# Patient Record
Sex: Male | Born: 1945 | Race: White | Hispanic: No | Marital: Married | Smoking: Current every day smoker
Health system: Southern US, Community
[De-identification: ages and names within clinical notes are randomized; demographics above are authoritative.]

## PROBLEM LIST (undated history)

## (undated) DIAGNOSIS — I6529 Occlusion and stenosis of unspecified carotid artery: Secondary | ICD-10-CM

## (undated) DIAGNOSIS — I2510 Atherosclerotic heart disease of native coronary artery without angina pectoris: Secondary | ICD-10-CM

## (undated) DIAGNOSIS — K5790 Diverticulosis of intestine, part unspecified, without perforation or abscess without bleeding: Secondary | ICD-10-CM

## (undated) DIAGNOSIS — I749 Embolism and thrombosis of unspecified artery: Secondary | ICD-10-CM

## (undated) DIAGNOSIS — E119 Type 2 diabetes mellitus without complications: Secondary | ICD-10-CM

## (undated) DIAGNOSIS — K219 Gastro-esophageal reflux disease without esophagitis: Secondary | ICD-10-CM

## (undated) DIAGNOSIS — M545 Low back pain, unspecified: Secondary | ICD-10-CM

## (undated) DIAGNOSIS — K279 Peptic ulcer, site unspecified, unspecified as acute or chronic, without hemorrhage or perforation: Secondary | ICD-10-CM

## (undated) DIAGNOSIS — M1A9XX1 Chronic gout, unspecified, with tophus (tophi): Secondary | ICD-10-CM

## (undated) DIAGNOSIS — G8929 Other chronic pain: Secondary | ICD-10-CM

## (undated) DIAGNOSIS — S62102A Fracture of unspecified carpal bone, left wrist, initial encounter for closed fracture: Secondary | ICD-10-CM

## (undated) DIAGNOSIS — Z8673 Personal history of transient ischemic attack (TIA), and cerebral infarction without residual deficits: Secondary | ICD-10-CM

## (undated) DIAGNOSIS — E785 Hyperlipidemia, unspecified: Secondary | ICD-10-CM

## (undated) DIAGNOSIS — I708 Atherosclerosis of other arteries: Secondary | ICD-10-CM

## (undated) DIAGNOSIS — K4090 Unilateral inguinal hernia, without obstruction or gangrene, not specified as recurrent: Secondary | ICD-10-CM

## (undated) DIAGNOSIS — I10 Essential (primary) hypertension: Secondary | ICD-10-CM

## (undated) DIAGNOSIS — F528 Other sexual dysfunction not due to a substance or known physiological condition: Secondary | ICD-10-CM

## (undated) DIAGNOSIS — Z720 Tobacco use: Secondary | ICD-10-CM

## (undated) DIAGNOSIS — M1990 Unspecified osteoarthritis, unspecified site: Secondary | ICD-10-CM

## (undated) DIAGNOSIS — I639 Cerebral infarction, unspecified: Secondary | ICD-10-CM

## (undated) DIAGNOSIS — R892 Abnormal level of other drugs, medicaments and biological substances in specimens from other organs, systems and tissues: Secondary | ICD-10-CM

## (undated) HISTORY — DX: Personal history of transient ischemic attack (TIA), and cerebral infarction without residual deficits: Z86.73

## (undated) HISTORY — PX: CERVICAL SPINE SURGERY: SHX589

## (undated) HISTORY — DX: Gastro-esophageal reflux disease without esophagitis: K21.9

## (undated) HISTORY — DX: Low back pain, unspecified: M54.50

## (undated) HISTORY — DX: Fracture of unspecified carpal bone, left wrist, initial encounter for closed fracture: S62.102A

## (undated) HISTORY — DX: Other chronic pain: G89.29

## (undated) HISTORY — DX: Abnormal level of other drugs, medicaments and biological substances in specimens from other organs, systems and tissues: R89.2

## (undated) HISTORY — PX: BACK SURGERY: SHX140

## (undated) HISTORY — DX: Type 2 diabetes mellitus without complications: E11.9

## (undated) HISTORY — DX: Chronic gout, unspecified, with tophus (tophi): M1A.9XX1

## (undated) HISTORY — DX: Other sexual dysfunction not due to a substance or known physiological condition: F52.8

## (undated) HISTORY — DX: Tobacco use: Z72.0

## (undated) HISTORY — DX: Essential (primary) hypertension: I10

## (undated) HISTORY — DX: Low back pain: M54.5

## (undated) HISTORY — DX: Hyperlipidemia, unspecified: E78.5

## (undated) HISTORY — DX: Peptic ulcer, site unspecified, unspecified as acute or chronic, without hemorrhage or perforation: K27.9

## (undated) HISTORY — DX: Cerebral infarction, unspecified: I63.9

## (undated) HISTORY — DX: Diverticulosis of intestine, part unspecified, without perforation or abscess without bleeding: K57.90

## (undated) HISTORY — PX: SPINE SURGERY: SHX786

## (undated) HISTORY — DX: Atherosclerotic heart disease of native coronary artery without angina pectoris: I25.10

## (undated) HISTORY — DX: Occlusion and stenosis of unspecified carotid artery: I65.29

## (undated) HISTORY — DX: Atherosclerosis of other arteries: I70.8

## (undated) HISTORY — DX: Unilateral inguinal hernia, without obstruction or gangrene, not specified as recurrent: K40.90

## (undated) HISTORY — DX: Embolism and thrombosis of unspecified artery: I74.9

## (undated) HISTORY — DX: Unspecified osteoarthritis, unspecified site: M19.90

---

## 1980-12-24 HISTORY — PX: NOSE SURGERY: SHX723

## 1982-12-24 HISTORY — PX: NECK SURGERY: SHX720

## 1995-12-25 HISTORY — PX: KNEE SURGERY: SHX244

## 1998-04-11 ENCOUNTER — Emergency Department (HOSPITAL_COMMUNITY): Admission: EM | Admit: 1998-04-11 | Discharge: 1998-04-11 | Payer: Self-pay | Admitting: Emergency Medicine

## 1998-04-13 ENCOUNTER — Encounter: Admission: RE | Admit: 1998-04-13 | Discharge: 1998-04-13 | Payer: Self-pay | Admitting: Family Medicine

## 1998-04-27 ENCOUNTER — Encounter: Admission: RE | Admit: 1998-04-27 | Discharge: 1998-04-27 | Payer: Self-pay | Admitting: Family Medicine

## 1998-05-11 ENCOUNTER — Encounter: Admission: RE | Admit: 1998-05-11 | Discharge: 1998-05-11 | Payer: Self-pay | Admitting: Family Medicine

## 1998-08-09 ENCOUNTER — Encounter: Admission: RE | Admit: 1998-08-09 | Discharge: 1998-08-09 | Payer: Self-pay | Admitting: Sports Medicine

## 1998-08-11 ENCOUNTER — Encounter: Admission: RE | Admit: 1998-08-11 | Discharge: 1998-08-11 | Payer: Self-pay | Admitting: Family Medicine

## 1998-09-21 ENCOUNTER — Encounter: Admission: RE | Admit: 1998-09-21 | Discharge: 1998-09-21 | Payer: Self-pay | Admitting: Family Medicine

## 1998-09-24 ENCOUNTER — Encounter: Payer: Self-pay | Admitting: Family Medicine

## 1998-09-24 ENCOUNTER — Inpatient Hospital Stay (HOSPITAL_COMMUNITY): Admission: EM | Admit: 1998-09-24 | Discharge: 1998-09-25 | Payer: Self-pay | Admitting: Emergency Medicine

## 1998-10-04 ENCOUNTER — Encounter: Admission: RE | Admit: 1998-10-04 | Discharge: 1998-10-04 | Payer: Self-pay | Admitting: *Deleted

## 1998-11-04 ENCOUNTER — Encounter: Admission: RE | Admit: 1998-11-04 | Discharge: 1998-11-04 | Payer: Self-pay | Admitting: Family Medicine

## 1998-11-30 ENCOUNTER — Encounter: Admission: RE | Admit: 1998-11-30 | Discharge: 1998-11-30 | Payer: Self-pay | Admitting: Family Medicine

## 1999-01-05 ENCOUNTER — Encounter: Admission: RE | Admit: 1999-01-05 | Discharge: 1999-01-05 | Payer: Self-pay | Admitting: Family Medicine

## 1999-01-25 ENCOUNTER — Encounter: Admission: RE | Admit: 1999-01-25 | Discharge: 1999-01-25 | Payer: Self-pay | Admitting: Family Medicine

## 1999-03-15 ENCOUNTER — Encounter: Admission: RE | Admit: 1999-03-15 | Discharge: 1999-03-15 | Payer: Self-pay | Admitting: Family Medicine

## 1999-06-09 ENCOUNTER — Encounter: Admission: RE | Admit: 1999-06-09 | Discharge: 1999-06-09 | Payer: Self-pay | Admitting: Family Medicine

## 1999-07-29 ENCOUNTER — Encounter: Payer: Self-pay | Admitting: Neurological Surgery

## 1999-07-29 ENCOUNTER — Ambulatory Visit (HOSPITAL_COMMUNITY): Admission: RE | Admit: 1999-07-29 | Discharge: 1999-07-29 | Payer: Self-pay | Admitting: Neurological Surgery

## 1999-08-15 ENCOUNTER — Encounter: Admission: RE | Admit: 1999-08-15 | Discharge: 1999-08-15 | Payer: Self-pay | Admitting: Family Medicine

## 1999-09-05 ENCOUNTER — Encounter: Admission: RE | Admit: 1999-09-05 | Discharge: 1999-09-05 | Payer: Self-pay | Admitting: Family Medicine

## 1999-09-25 ENCOUNTER — Ambulatory Visit (HOSPITAL_COMMUNITY): Admission: RE | Admit: 1999-09-25 | Discharge: 1999-09-25 | Payer: Self-pay | Admitting: Neurological Surgery

## 1999-09-25 ENCOUNTER — Encounter: Payer: Self-pay | Admitting: Neurological Surgery

## 1999-10-09 ENCOUNTER — Ambulatory Visit (HOSPITAL_COMMUNITY): Admission: RE | Admit: 1999-10-09 | Discharge: 1999-10-09 | Payer: Self-pay | Admitting: Neurological Surgery

## 1999-10-09 ENCOUNTER — Encounter: Payer: Self-pay | Admitting: Neurological Surgery

## 1999-10-23 ENCOUNTER — Ambulatory Visit (HOSPITAL_COMMUNITY): Admission: RE | Admit: 1999-10-23 | Discharge: 1999-10-23 | Payer: Self-pay | Admitting: Neurological Surgery

## 1999-10-23 ENCOUNTER — Encounter: Payer: Self-pay | Admitting: Neurological Surgery

## 1999-11-03 ENCOUNTER — Encounter: Admission: RE | Admit: 1999-11-03 | Discharge: 1999-11-03 | Payer: Self-pay | Admitting: Neurological Surgery

## 1999-11-03 ENCOUNTER — Encounter: Payer: Self-pay | Admitting: Neurological Surgery

## 2000-02-06 ENCOUNTER — Ambulatory Visit (HOSPITAL_COMMUNITY): Admission: RE | Admit: 2000-02-06 | Discharge: 2000-02-06 | Payer: Self-pay | Admitting: Sports Medicine

## 2000-02-06 ENCOUNTER — Encounter: Admission: RE | Admit: 2000-02-06 | Discharge: 2000-02-06 | Payer: Self-pay | Admitting: Family Medicine

## 2000-03-19 ENCOUNTER — Encounter: Admission: RE | Admit: 2000-03-19 | Discharge: 2000-03-19 | Payer: Self-pay | Admitting: Sports Medicine

## 2000-05-10 ENCOUNTER — Encounter: Admission: RE | Admit: 2000-05-10 | Discharge: 2000-05-10 | Payer: Self-pay | Admitting: Family Medicine

## 2000-05-22 ENCOUNTER — Encounter: Admission: RE | Admit: 2000-05-22 | Discharge: 2000-05-22 | Payer: Self-pay | Admitting: Family Medicine

## 2000-08-26 ENCOUNTER — Emergency Department (HOSPITAL_COMMUNITY): Admission: EM | Admit: 2000-08-26 | Discharge: 2000-08-26 | Payer: Self-pay | Admitting: Emergency Medicine

## 2000-10-23 ENCOUNTER — Encounter: Admission: RE | Admit: 2000-10-23 | Discharge: 2000-10-23 | Payer: Self-pay | Admitting: Family Medicine

## 2001-04-28 ENCOUNTER — Encounter: Admission: RE | Admit: 2001-04-28 | Discharge: 2001-04-28 | Payer: Self-pay | Admitting: Family Medicine

## 2001-10-17 ENCOUNTER — Encounter: Admission: RE | Admit: 2001-10-17 | Discharge: 2001-10-17 | Payer: Self-pay | Admitting: Family Medicine

## 2002-01-07 ENCOUNTER — Encounter: Admission: RE | Admit: 2002-01-07 | Discharge: 2002-01-07 | Payer: Self-pay | Admitting: Family Medicine

## 2002-02-06 ENCOUNTER — Encounter: Admission: RE | Admit: 2002-02-06 | Discharge: 2002-02-06 | Payer: Self-pay | Admitting: Family Medicine

## 2002-08-13 ENCOUNTER — Encounter: Admission: RE | Admit: 2002-08-13 | Discharge: 2002-08-13 | Payer: Self-pay | Admitting: Family Medicine

## 2002-12-04 ENCOUNTER — Encounter: Admission: RE | Admit: 2002-12-04 | Discharge: 2002-12-04 | Payer: Self-pay | Admitting: Family Medicine

## 2003-06-23 ENCOUNTER — Encounter: Admission: RE | Admit: 2003-06-23 | Discharge: 2003-06-23 | Payer: Self-pay | Admitting: Family Medicine

## 2003-07-23 ENCOUNTER — Encounter: Admission: RE | Admit: 2003-07-23 | Discharge: 2003-07-23 | Payer: Self-pay | Admitting: Family Medicine

## 2003-09-17 ENCOUNTER — Emergency Department (HOSPITAL_COMMUNITY): Admission: EM | Admit: 2003-09-17 | Discharge: 2003-09-17 | Payer: Self-pay | Admitting: Emergency Medicine

## 2003-09-23 ENCOUNTER — Encounter: Admission: RE | Admit: 2003-09-23 | Discharge: 2003-09-23 | Payer: Self-pay | Admitting: Family Medicine

## 2003-09-28 ENCOUNTER — Encounter: Admission: RE | Admit: 2003-09-28 | Discharge: 2003-09-28 | Payer: Self-pay | Admitting: Family Medicine

## 2003-10-20 ENCOUNTER — Encounter: Admission: RE | Admit: 2003-10-20 | Discharge: 2003-10-20 | Payer: Self-pay | Admitting: Family Medicine

## 2003-11-04 ENCOUNTER — Encounter: Admission: RE | Admit: 2003-11-04 | Discharge: 2003-11-04 | Payer: Self-pay | Admitting: Sports Medicine

## 2003-12-06 ENCOUNTER — Encounter
Admission: RE | Admit: 2003-12-06 | Discharge: 2004-03-05 | Payer: Self-pay | Admitting: Physical Medicine and Rehabilitation

## 2004-03-06 ENCOUNTER — Encounter
Admission: RE | Admit: 2004-03-06 | Discharge: 2004-06-04 | Payer: Self-pay | Admitting: Physical Medicine and Rehabilitation

## 2004-06-02 ENCOUNTER — Encounter: Admission: RE | Admit: 2004-06-02 | Discharge: 2004-06-02 | Payer: Self-pay | Admitting: Sports Medicine

## 2004-06-05 ENCOUNTER — Encounter
Admission: RE | Admit: 2004-06-05 | Discharge: 2004-09-03 | Payer: Self-pay | Admitting: Physical Medicine and Rehabilitation

## 2004-08-23 ENCOUNTER — Encounter: Admission: RE | Admit: 2004-08-23 | Discharge: 2004-08-23 | Payer: Self-pay | Admitting: Family Medicine

## 2004-08-28 ENCOUNTER — Emergency Department (HOSPITAL_COMMUNITY): Admission: EM | Admit: 2004-08-28 | Discharge: 2004-08-29 | Payer: Self-pay | Admitting: Emergency Medicine

## 2004-09-12 ENCOUNTER — Encounter
Admission: RE | Admit: 2004-09-12 | Discharge: 2004-11-09 | Payer: Self-pay | Admitting: Physical Medicine and Rehabilitation

## 2004-09-13 ENCOUNTER — Ambulatory Visit: Payer: Self-pay | Admitting: Physical Medicine and Rehabilitation

## 2004-09-19 ENCOUNTER — Ambulatory Visit (HOSPITAL_COMMUNITY)
Admission: RE | Admit: 2004-09-19 | Discharge: 2004-09-19 | Payer: Self-pay | Admitting: Physical Medicine and Rehabilitation

## 2004-11-09 ENCOUNTER — Encounter
Admission: RE | Admit: 2004-11-09 | Discharge: 2005-01-03 | Payer: Self-pay | Admitting: Physical Medicine and Rehabilitation

## 2004-11-10 ENCOUNTER — Ambulatory Visit: Payer: Self-pay | Admitting: Physical Medicine and Rehabilitation

## 2004-11-21 ENCOUNTER — Ambulatory Visit (HOSPITAL_COMMUNITY): Admission: RE | Admit: 2004-11-21 | Discharge: 2004-11-22 | Payer: Self-pay | Admitting: Neurological Surgery

## 2004-12-12 ENCOUNTER — Emergency Department (HOSPITAL_COMMUNITY): Admission: EM | Admit: 2004-12-12 | Discharge: 2004-12-13 | Payer: Self-pay | Admitting: Emergency Medicine

## 2005-01-03 ENCOUNTER — Encounter
Admission: RE | Admit: 2005-01-03 | Discharge: 2005-04-03 | Payer: Self-pay | Admitting: Physical Medicine and Rehabilitation

## 2005-01-05 ENCOUNTER — Ambulatory Visit: Payer: Self-pay | Admitting: Physical Medicine and Rehabilitation

## 2005-02-10 ENCOUNTER — Emergency Department (HOSPITAL_COMMUNITY): Admission: EM | Admit: 2005-02-10 | Discharge: 2005-02-10 | Payer: Self-pay | Admitting: Family Medicine

## 2005-02-28 ENCOUNTER — Ambulatory Visit: Payer: Self-pay | Admitting: Physical Medicine and Rehabilitation

## 2005-03-20 ENCOUNTER — Ambulatory Visit: Payer: Self-pay | Admitting: Family Medicine

## 2005-05-02 ENCOUNTER — Encounter
Admission: RE | Admit: 2005-05-02 | Discharge: 2005-07-31 | Payer: Self-pay | Admitting: Physical Medicine and Rehabilitation

## 2005-05-04 ENCOUNTER — Ambulatory Visit: Payer: Self-pay | Admitting: Physical Medicine and Rehabilitation

## 2005-06-04 ENCOUNTER — Encounter
Admission: RE | Admit: 2005-06-04 | Discharge: 2005-09-02 | Payer: Self-pay | Admitting: Physical Medicine and Rehabilitation

## 2005-06-27 ENCOUNTER — Ambulatory Visit: Payer: Self-pay | Admitting: Physical Medicine and Rehabilitation

## 2005-07-23 ENCOUNTER — Ambulatory Visit: Payer: Self-pay | Admitting: Family Medicine

## 2005-08-21 ENCOUNTER — Ambulatory Visit: Payer: Self-pay | Admitting: Physical Medicine and Rehabilitation

## 2005-08-21 ENCOUNTER — Encounter
Admission: RE | Admit: 2005-08-21 | Discharge: 2005-11-19 | Payer: Self-pay | Admitting: Physical Medicine and Rehabilitation

## 2005-10-01 ENCOUNTER — Encounter
Admission: RE | Admit: 2005-10-01 | Discharge: 2005-10-15 | Payer: Self-pay | Admitting: Physical Medicine and Rehabilitation

## 2005-10-18 ENCOUNTER — Ambulatory Visit: Payer: Self-pay | Admitting: Physical Medicine and Rehabilitation

## 2005-11-09 ENCOUNTER — Ambulatory Visit: Payer: Self-pay | Admitting: Family Medicine

## 2005-12-08 ENCOUNTER — Encounter
Admission: RE | Admit: 2005-12-08 | Discharge: 2006-03-08 | Payer: Self-pay | Admitting: Physical Medicine and Rehabilitation

## 2005-12-08 ENCOUNTER — Ambulatory Visit: Payer: Self-pay | Admitting: Physical Medicine and Rehabilitation

## 2005-12-24 DIAGNOSIS — I2510 Atherosclerotic heart disease of native coronary artery without angina pectoris: Secondary | ICD-10-CM

## 2005-12-24 HISTORY — PX: ESOPHAGOGASTRODUODENOSCOPY: SHX1529

## 2005-12-24 HISTORY — DX: Atherosclerotic heart disease of native coronary artery without angina pectoris: I25.10

## 2006-01-11 ENCOUNTER — Ambulatory Visit: Payer: Self-pay | Admitting: Family Medicine

## 2006-01-31 ENCOUNTER — Ambulatory Visit: Payer: Self-pay | Admitting: Physical Medicine and Rehabilitation

## 2006-02-18 ENCOUNTER — Ambulatory Visit: Payer: Self-pay | Admitting: Family Medicine

## 2006-03-05 ENCOUNTER — Ambulatory Visit: Payer: Self-pay | Admitting: Family Medicine

## 2006-03-19 ENCOUNTER — Ambulatory Visit: Payer: Self-pay | Admitting: Physical Medicine and Rehabilitation

## 2006-03-19 ENCOUNTER — Encounter
Admission: RE | Admit: 2006-03-19 | Discharge: 2006-06-17 | Payer: Self-pay | Admitting: Physical Medicine and Rehabilitation

## 2006-03-28 ENCOUNTER — Ambulatory Visit: Payer: Self-pay | Admitting: Family Medicine

## 2006-04-09 ENCOUNTER — Ambulatory Visit: Payer: Self-pay | Admitting: Sports Medicine

## 2006-04-22 ENCOUNTER — Ambulatory Visit: Payer: Self-pay | Admitting: Physical Medicine and Rehabilitation

## 2006-04-25 ENCOUNTER — Ambulatory Visit (HOSPITAL_COMMUNITY): Admission: RE | Admit: 2006-04-25 | Discharge: 2006-04-25 | Payer: Self-pay | Admitting: Gastroenterology

## 2006-05-01 ENCOUNTER — Ambulatory Visit: Payer: Self-pay | Admitting: Gastroenterology

## 2006-05-21 ENCOUNTER — Ambulatory Visit: Payer: Self-pay | Admitting: Physical Medicine and Rehabilitation

## 2006-06-18 ENCOUNTER — Encounter
Admission: RE | Admit: 2006-06-18 | Discharge: 2006-09-16 | Payer: Self-pay | Admitting: Physical Medicine and Rehabilitation

## 2006-07-05 ENCOUNTER — Ambulatory Visit: Payer: Self-pay | Admitting: Family Medicine

## 2006-07-19 ENCOUNTER — Ambulatory Visit: Payer: Self-pay | Admitting: Physical Medicine and Rehabilitation

## 2006-09-02 ENCOUNTER — Ambulatory Visit: Payer: Self-pay | Admitting: Family Medicine

## 2006-09-10 ENCOUNTER — Ambulatory Visit: Payer: Self-pay | Admitting: Physical Medicine and Rehabilitation

## 2006-09-15 ENCOUNTER — Emergency Department (HOSPITAL_COMMUNITY): Admission: EM | Admit: 2006-09-15 | Discharge: 2006-09-15 | Payer: Self-pay | Admitting: Family Medicine

## 2006-09-27 ENCOUNTER — Ambulatory Visit: Payer: Self-pay | Admitting: Family Medicine

## 2006-10-08 ENCOUNTER — Encounter
Admission: RE | Admit: 2006-10-08 | Discharge: 2007-01-06 | Payer: Self-pay | Admitting: Physical Medicine and Rehabilitation

## 2006-11-05 ENCOUNTER — Ambulatory Visit: Payer: Self-pay | Admitting: Physical Medicine and Rehabilitation

## 2006-11-07 ENCOUNTER — Ambulatory Visit: Payer: Self-pay | Admitting: Sports Medicine

## 2006-11-26 ENCOUNTER — Ambulatory Visit: Payer: Self-pay | Admitting: Family Medicine

## 2006-12-06 ENCOUNTER — Ambulatory Visit: Payer: Self-pay | Admitting: Sports Medicine

## 2006-12-24 HISTORY — PX: COLONOSCOPY: SHX174

## 2006-12-31 ENCOUNTER — Ambulatory Visit: Payer: Self-pay | Admitting: Physical Medicine and Rehabilitation

## 2007-01-10 ENCOUNTER — Ambulatory Visit: Payer: Self-pay | Admitting: Family Medicine

## 2007-01-23 ENCOUNTER — Ambulatory Visit (HOSPITAL_COMMUNITY): Admission: RE | Admit: 2007-01-23 | Discharge: 2007-01-23 | Payer: Self-pay | Admitting: Gastroenterology

## 2007-01-30 ENCOUNTER — Encounter
Admission: RE | Admit: 2007-01-30 | Discharge: 2007-04-30 | Payer: Self-pay | Admitting: Physical Medicine and Rehabilitation

## 2007-02-12 ENCOUNTER — Ambulatory Visit: Payer: Self-pay | Admitting: Physical Medicine and Rehabilitation

## 2007-02-20 DIAGNOSIS — F528 Other sexual dysfunction not due to a substance or known physiological condition: Secondary | ICD-10-CM | POA: Insufficient documentation

## 2007-02-20 DIAGNOSIS — K219 Gastro-esophageal reflux disease without esophagitis: Secondary | ICD-10-CM

## 2007-02-20 DIAGNOSIS — F17200 Nicotine dependence, unspecified, uncomplicated: Secondary | ICD-10-CM

## 2007-02-20 DIAGNOSIS — E118 Type 2 diabetes mellitus with unspecified complications: Secondary | ICD-10-CM | POA: Insufficient documentation

## 2007-02-20 DIAGNOSIS — I10 Essential (primary) hypertension: Secondary | ICD-10-CM | POA: Insufficient documentation

## 2007-02-20 DIAGNOSIS — E7800 Pure hypercholesterolemia, unspecified: Secondary | ICD-10-CM

## 2007-02-20 DIAGNOSIS — M159 Polyosteoarthritis, unspecified: Secondary | ICD-10-CM | POA: Insufficient documentation

## 2007-02-24 ENCOUNTER — Encounter
Admission: RE | Admit: 2007-02-24 | Discharge: 2007-05-25 | Payer: Self-pay | Admitting: Physical Medicine and Rehabilitation

## 2007-03-12 ENCOUNTER — Encounter (INDEPENDENT_AMBULATORY_CARE_PROVIDER_SITE_OTHER): Payer: Self-pay | Admitting: *Deleted

## 2007-03-12 ENCOUNTER — Ambulatory Visit: Payer: Self-pay | Admitting: Sports Medicine

## 2007-03-12 LAB — CONVERTED CEMR LAB
HDL: 35 mg/dL
Hgb A1c MFr Bld: 7.4 %
LDL Cholesterol: 77 mg/dL
PSA: 0.57 ng/mL (ref 0.10–4.00)

## 2007-03-17 ENCOUNTER — Encounter (INDEPENDENT_AMBULATORY_CARE_PROVIDER_SITE_OTHER): Payer: Self-pay | Admitting: *Deleted

## 2007-03-19 ENCOUNTER — Ambulatory Visit: Payer: Self-pay | Admitting: Physical Medicine and Rehabilitation

## 2007-03-26 ENCOUNTER — Encounter: Payer: Self-pay | Admitting: *Deleted

## 2007-04-07 ENCOUNTER — Encounter
Admission: RE | Admit: 2007-04-07 | Discharge: 2007-04-18 | Payer: Self-pay | Admitting: Physical Medicine and Rehabilitation

## 2007-04-17 ENCOUNTER — Telehealth: Payer: Self-pay | Admitting: *Deleted

## 2007-04-17 ENCOUNTER — Ambulatory Visit: Payer: Self-pay | Admitting: Physical Medicine and Rehabilitation

## 2007-04-21 ENCOUNTER — Ambulatory Visit: Payer: Self-pay | Admitting: Sports Medicine

## 2007-04-21 DIAGNOSIS — L723 Sebaceous cyst: Secondary | ICD-10-CM | POA: Insufficient documentation

## 2007-05-05 ENCOUNTER — Encounter (INDEPENDENT_AMBULATORY_CARE_PROVIDER_SITE_OTHER): Payer: Self-pay | Admitting: *Deleted

## 2007-05-05 ENCOUNTER — Ambulatory Visit: Payer: Self-pay | Admitting: Family Medicine

## 2007-05-05 LAB — CONVERTED CEMR LAB: TSH: 0.341 microintl units/mL — ABNORMAL LOW (ref 0.350–5.50)

## 2007-05-20 ENCOUNTER — Encounter
Admission: RE | Admit: 2007-05-20 | Discharge: 2007-08-18 | Payer: Self-pay | Admitting: Physical Medicine and Rehabilitation

## 2007-05-26 ENCOUNTER — Ambulatory Visit: Payer: Self-pay | Admitting: *Deleted

## 2007-05-26 ENCOUNTER — Encounter (INDEPENDENT_AMBULATORY_CARE_PROVIDER_SITE_OTHER): Payer: Self-pay | Admitting: *Deleted

## 2007-05-27 LAB — CONVERTED CEMR LAB
AST: 17 units/L (ref 0–37)
Alkaline Phosphatase: 73 units/L (ref 39–117)
Glucose, Bld: 161 mg/dL — ABNORMAL HIGH (ref 70–99)
Sodium: 140 meq/L (ref 135–145)
Total Bilirubin: 0.4 mg/dL (ref 0.3–1.2)
Total Protein: 6.9 g/dL (ref 6.0–8.3)

## 2007-06-04 ENCOUNTER — Ambulatory Visit: Payer: Self-pay | Admitting: Family Medicine

## 2007-06-04 LAB — CONVERTED CEMR LAB: Hgb A1c MFr Bld: 7.1 %

## 2007-06-12 ENCOUNTER — Ambulatory Visit: Payer: Self-pay | Admitting: Physical Medicine and Rehabilitation

## 2007-07-16 ENCOUNTER — Ambulatory Visit: Payer: Self-pay | Admitting: Physical Medicine and Rehabilitation

## 2007-08-13 ENCOUNTER — Ambulatory Visit: Payer: Self-pay | Admitting: Physical Medicine and Rehabilitation

## 2007-09-01 ENCOUNTER — Encounter (INDEPENDENT_AMBULATORY_CARE_PROVIDER_SITE_OTHER): Payer: Self-pay | Admitting: *Deleted

## 2007-09-01 ENCOUNTER — Ambulatory Visit: Payer: Self-pay | Admitting: Family Medicine

## 2007-09-01 LAB — CONVERTED CEMR LAB
ALT: 11 units/L (ref 0–53)
AST: 13 units/L (ref 0–37)
Albumin: 4.1 g/dL (ref 3.5–5.2)
BUN: 23 mg/dL (ref 6–23)
CO2: 27 meq/L (ref 19–32)
Calcium: 9.1 mg/dL (ref 8.4–10.5)
Chloride: 102 meq/L (ref 96–112)
HDL: 29 mg/dL — ABNORMAL LOW (ref >39)
Potassium: 3.8 meq/L (ref 3.5–5.3)

## 2007-09-08 ENCOUNTER — Ambulatory Visit: Payer: Self-pay | Admitting: Family Medicine

## 2007-09-08 LAB — CONVERTED CEMR LAB: Hgb A1c MFr Bld: 6.4 %

## 2007-09-09 ENCOUNTER — Encounter
Admission: RE | Admit: 2007-09-09 | Discharge: 2007-12-08 | Payer: Self-pay | Admitting: Physical Medicine and Rehabilitation

## 2007-09-15 ENCOUNTER — Encounter (INDEPENDENT_AMBULATORY_CARE_PROVIDER_SITE_OTHER): Payer: Self-pay | Admitting: *Deleted

## 2007-10-01 ENCOUNTER — Ambulatory Visit: Payer: Self-pay | Admitting: Physical Medicine and Rehabilitation

## 2007-10-08 ENCOUNTER — Ambulatory Visit: Payer: Self-pay | Admitting: Family Medicine

## 2007-11-04 ENCOUNTER — Ambulatory Visit: Payer: Self-pay | Admitting: Physical Medicine and Rehabilitation

## 2007-11-10 ENCOUNTER — Encounter (INDEPENDENT_AMBULATORY_CARE_PROVIDER_SITE_OTHER): Payer: Self-pay | Admitting: *Deleted

## 2007-12-04 ENCOUNTER — Encounter
Admission: RE | Admit: 2007-12-04 | Discharge: 2007-12-04 | Payer: Self-pay | Admitting: Physical Medicine and Rehabilitation

## 2007-12-05 ENCOUNTER — Ambulatory Visit: Payer: Self-pay | Admitting: Physical Medicine and Rehabilitation

## 2007-12-25 DIAGNOSIS — Z8673 Personal history of transient ischemic attack (TIA), and cerebral infarction without residual deficits: Secondary | ICD-10-CM

## 2007-12-25 HISTORY — DX: Personal history of transient ischemic attack (TIA), and cerebral infarction without residual deficits: Z86.73

## 2007-12-28 ENCOUNTER — Emergency Department (HOSPITAL_COMMUNITY): Admission: EM | Admit: 2007-12-28 | Discharge: 2007-12-28 | Payer: Self-pay | Admitting: *Deleted

## 2008-01-02 ENCOUNTER — Encounter
Admission: RE | Admit: 2008-01-02 | Discharge: 2008-04-01 | Payer: Self-pay | Admitting: Physical Medicine and Rehabilitation

## 2008-01-05 ENCOUNTER — Ambulatory Visit: Payer: Self-pay | Admitting: Sports Medicine

## 2008-01-05 ENCOUNTER — Encounter (INDEPENDENT_AMBULATORY_CARE_PROVIDER_SITE_OTHER): Payer: Self-pay | Admitting: *Deleted

## 2008-01-05 DIAGNOSIS — J984 Other disorders of lung: Secondary | ICD-10-CM

## 2008-01-05 LAB — CONVERTED CEMR LAB
ALT: 14 units/L (ref 0–53)
AST: 15 units/L (ref 0–37)
Albumin: 4.3 g/dL (ref 3.5–5.2)
Calcium: 9.6 mg/dL (ref 8.4–10.5)
Chloride: 98 meq/L (ref 96–112)
Hgb A1c MFr Bld: 7.1 %
Potassium: 3.3 meq/L — ABNORMAL LOW (ref 3.5–5.3)

## 2008-01-19 ENCOUNTER — Encounter (INDEPENDENT_AMBULATORY_CARE_PROVIDER_SITE_OTHER): Payer: Self-pay | Admitting: *Deleted

## 2008-01-26 ENCOUNTER — Encounter: Admission: RE | Admit: 2008-01-26 | Discharge: 2008-01-26 | Payer: Self-pay | Admitting: Gastroenterology

## 2008-01-28 ENCOUNTER — Ambulatory Visit: Payer: Self-pay | Admitting: Physical Medicine and Rehabilitation

## 2008-03-16 ENCOUNTER — Ambulatory Visit: Payer: Self-pay | Admitting: Family Medicine

## 2008-03-16 ENCOUNTER — Encounter (INDEPENDENT_AMBULATORY_CARE_PROVIDER_SITE_OTHER): Payer: Self-pay | Admitting: *Deleted

## 2008-03-16 LAB — CONVERTED CEMR LAB

## 2008-03-17 LAB — CONVERTED CEMR LAB
ALT: 13 units/L (ref 0–53)
AST: 15 units/L (ref 0–37)
Creatinine, Ser: 0.7 mg/dL (ref 0.40–1.50)
Direct LDL: 56 mg/dL
Total Bilirubin: 0.3 mg/dL (ref 0.3–1.2)

## 2008-03-24 HISTORY — PX: GASTROJEJUNOSTOMY: SHX1697

## 2008-03-25 ENCOUNTER — Encounter
Admission: RE | Admit: 2008-03-25 | Discharge: 2008-06-23 | Payer: Self-pay | Admitting: Physical Medicine and Rehabilitation

## 2008-03-29 ENCOUNTER — Ambulatory Visit: Payer: Self-pay | Admitting: Physical Medicine and Rehabilitation

## 2008-04-21 ENCOUNTER — Inpatient Hospital Stay (HOSPITAL_COMMUNITY): Admission: RE | Admit: 2008-04-21 | Discharge: 2008-04-30 | Payer: Self-pay | Admitting: Surgery

## 2008-04-23 HISTORY — PX: CAROTID ENDARTERECTOMY: SUR193

## 2008-04-25 ENCOUNTER — Ambulatory Visit: Payer: Self-pay | Admitting: Internal Medicine

## 2008-05-07 ENCOUNTER — Encounter (INDEPENDENT_AMBULATORY_CARE_PROVIDER_SITE_OTHER): Payer: Self-pay | Admitting: *Deleted

## 2008-05-10 ENCOUNTER — Ambulatory Visit: Payer: Self-pay | Admitting: Physical Medicine and Rehabilitation

## 2008-06-01 ENCOUNTER — Encounter (INDEPENDENT_AMBULATORY_CARE_PROVIDER_SITE_OTHER): Payer: Self-pay | Admitting: *Deleted

## 2008-06-09 ENCOUNTER — Ambulatory Visit: Payer: Self-pay | Admitting: Physical Medicine and Rehabilitation

## 2008-06-30 ENCOUNTER — Encounter
Admission: RE | Admit: 2008-06-30 | Discharge: 2008-09-06 | Payer: Self-pay | Admitting: Physical Medicine and Rehabilitation

## 2008-07-07 ENCOUNTER — Ambulatory Visit: Payer: Self-pay | Admitting: Physical Medicine and Rehabilitation

## 2008-07-21 ENCOUNTER — Ambulatory Visit: Payer: Self-pay | Admitting: Family Medicine

## 2008-07-21 ENCOUNTER — Ambulatory Visit: Payer: Self-pay | Admitting: Surgery

## 2008-07-21 ENCOUNTER — Encounter: Payer: Self-pay | Admitting: Family Medicine

## 2008-07-21 ENCOUNTER — Observation Stay (HOSPITAL_COMMUNITY): Admission: EM | Admit: 2008-07-21 | Discharge: 2008-07-22 | Payer: Self-pay | Admitting: Emergency Medicine

## 2008-07-23 ENCOUNTER — Telehealth: Payer: Self-pay | Admitting: Family Medicine

## 2008-07-26 ENCOUNTER — Ambulatory Visit: Payer: Self-pay | Admitting: Vascular Surgery

## 2008-07-28 ENCOUNTER — Inpatient Hospital Stay (HOSPITAL_COMMUNITY): Admission: RE | Admit: 2008-07-28 | Discharge: 2008-07-29 | Payer: Self-pay | Admitting: Vascular Surgery

## 2008-07-28 ENCOUNTER — Encounter: Payer: Self-pay | Admitting: Vascular Surgery

## 2008-07-29 ENCOUNTER — Encounter: Payer: Self-pay | Admitting: *Deleted

## 2008-08-09 ENCOUNTER — Ambulatory Visit: Payer: Self-pay | Admitting: Physical Medicine and Rehabilitation

## 2008-08-17 ENCOUNTER — Ambulatory Visit: Payer: Self-pay | Admitting: Vascular Surgery

## 2008-09-06 ENCOUNTER — Ambulatory Visit: Payer: Self-pay | Admitting: Physical Medicine and Rehabilitation

## 2008-10-04 ENCOUNTER — Encounter
Admission: RE | Admit: 2008-10-04 | Discharge: 2009-01-02 | Payer: Self-pay | Admitting: Physical Medicine and Rehabilitation

## 2008-10-06 ENCOUNTER — Ambulatory Visit: Payer: Self-pay | Admitting: Physical Medicine and Rehabilitation

## 2008-10-11 ENCOUNTER — Ambulatory Visit (HOSPITAL_COMMUNITY)
Admission: RE | Admit: 2008-10-11 | Discharge: 2008-10-11 | Payer: Self-pay | Admitting: Physical Medicine and Rehabilitation

## 2008-10-19 ENCOUNTER — Ambulatory Visit: Payer: Self-pay | Admitting: Family Medicine

## 2008-10-19 LAB — CONVERTED CEMR LAB: Hgb A1c MFr Bld: 7.3 %

## 2008-11-01 ENCOUNTER — Ambulatory Visit: Payer: Self-pay | Admitting: Physical Medicine and Rehabilitation

## 2008-11-30 ENCOUNTER — Ambulatory Visit: Payer: Self-pay | Admitting: Physical Medicine and Rehabilitation

## 2008-12-29 ENCOUNTER — Ambulatory Visit: Payer: Self-pay | Admitting: Physical Medicine and Rehabilitation

## 2009-01-21 ENCOUNTER — Encounter
Admission: RE | Admit: 2009-01-21 | Discharge: 2009-04-20 | Payer: Self-pay | Admitting: Physical Medicine and Rehabilitation

## 2009-01-26 ENCOUNTER — Ambulatory Visit: Payer: Self-pay | Admitting: Physical Medicine and Rehabilitation

## 2009-02-01 ENCOUNTER — Ambulatory Visit: Payer: Self-pay | Admitting: Vascular Surgery

## 2009-02-15 ENCOUNTER — Ambulatory Visit (HOSPITAL_COMMUNITY)
Admission: RE | Admit: 2009-02-15 | Discharge: 2009-02-15 | Payer: Self-pay | Admitting: Physical Medicine and Rehabilitation

## 2009-02-23 ENCOUNTER — Ambulatory Visit: Payer: Self-pay | Admitting: Physical Medicine and Rehabilitation

## 2009-03-11 ENCOUNTER — Telehealth: Payer: Self-pay | Admitting: *Deleted

## 2009-03-23 ENCOUNTER — Ambulatory Visit: Payer: Self-pay | Admitting: Physical Medicine and Rehabilitation

## 2009-03-30 ENCOUNTER — Ambulatory Visit: Payer: Self-pay | Admitting: Family Medicine

## 2009-03-30 ENCOUNTER — Encounter: Admission: RE | Admit: 2009-03-30 | Discharge: 2009-03-30 | Payer: Self-pay | Admitting: Family Medicine

## 2009-04-20 ENCOUNTER — Ambulatory Visit: Payer: Self-pay | Admitting: Physical Medicine and Rehabilitation

## 2009-05-09 IMAGING — CT CT CHEST W/O CM
2 of 4 series · 15 of 36 positions shown, 18 images · non-contrast
Comparison: CT abdomen 12/28/2007.

CLINICAL DATA: 62-year-old male with solitary right lower lobe
pulmonary nodule.  Smoker.

CT CHEST WITHOUT CONTRAST
TECHNIQUE: Multidetector CT imaging of the chest was performed
following the standard protocol without IV contrast.

[Series 2: routine chest · axial · 0.70mm/px · z∈[-258,-28]mm · 12 of 56 slices shown, 15 images]
[im 5/56  mediastinal]
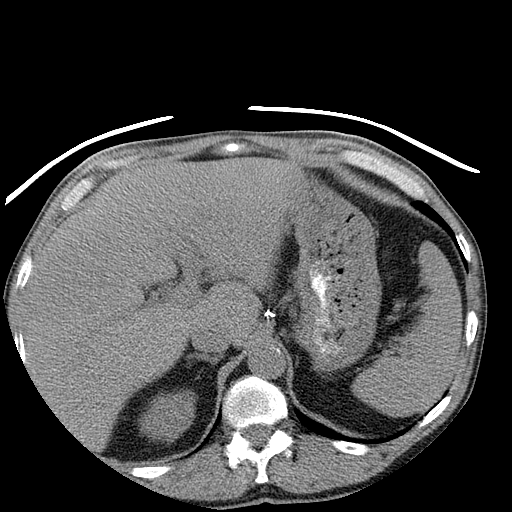
[im 5/56  lung]
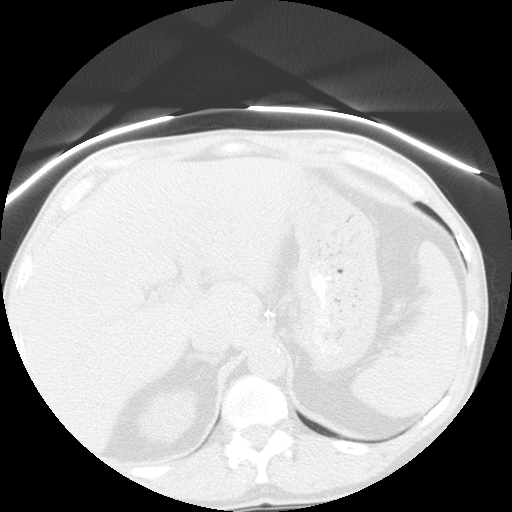
[im 9/56  lung]
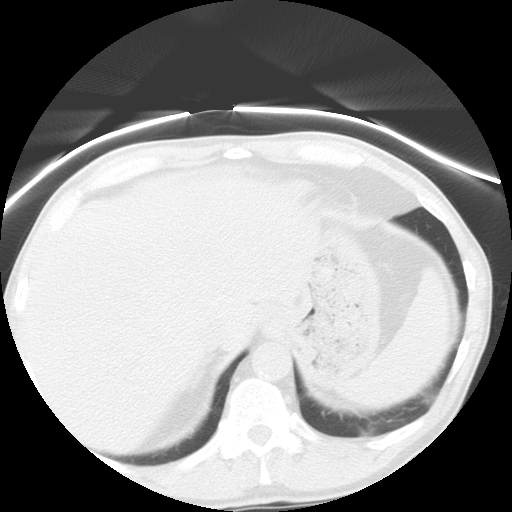
[im 13/56  lung]
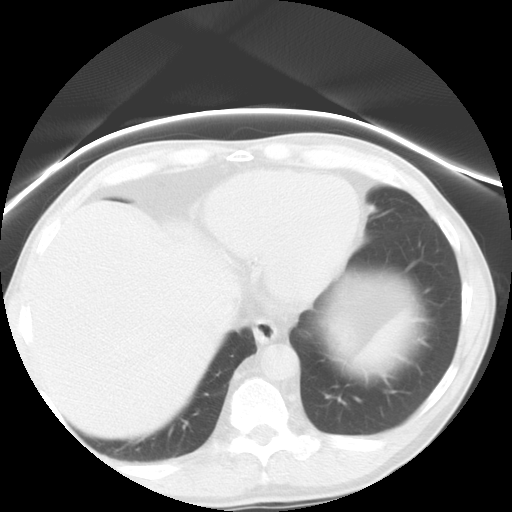
[im 17/56  lung]
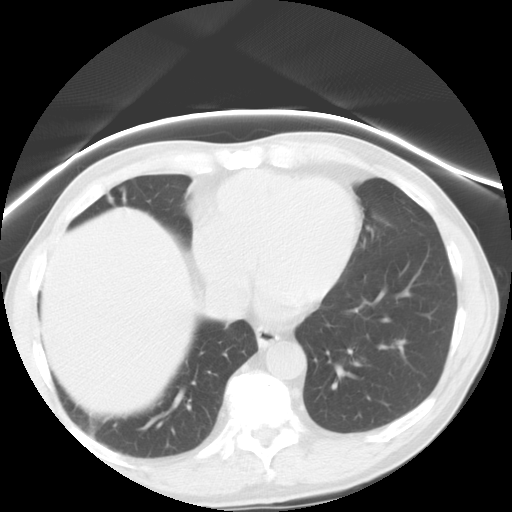
[im 22/56  mediastinal]
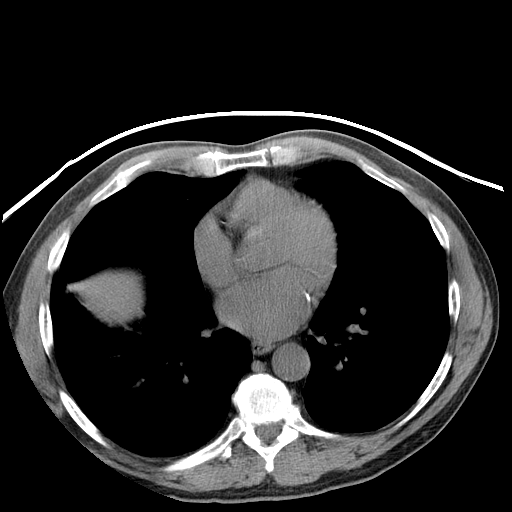
[im 22/56  lung]
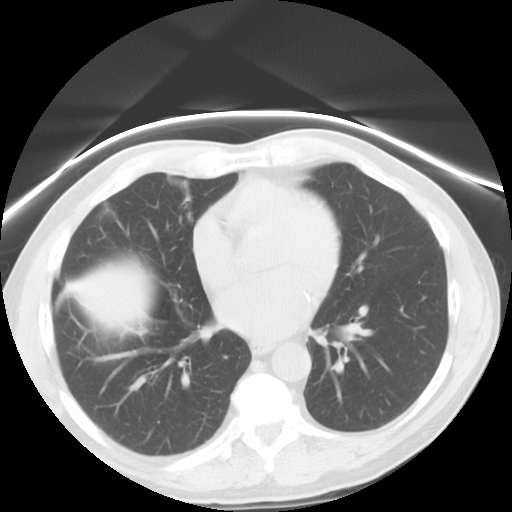
[im 26/56  lung]
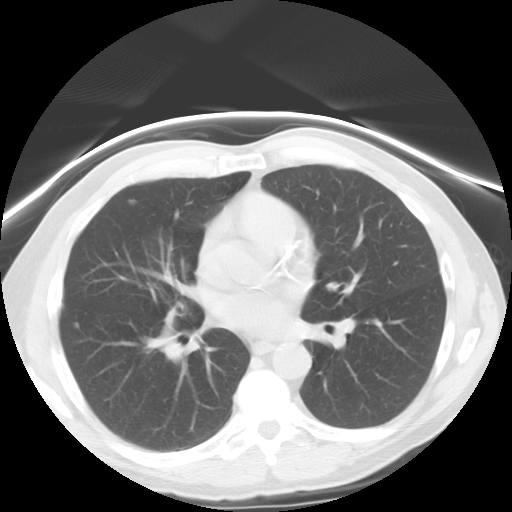
[im 30/56  lung]
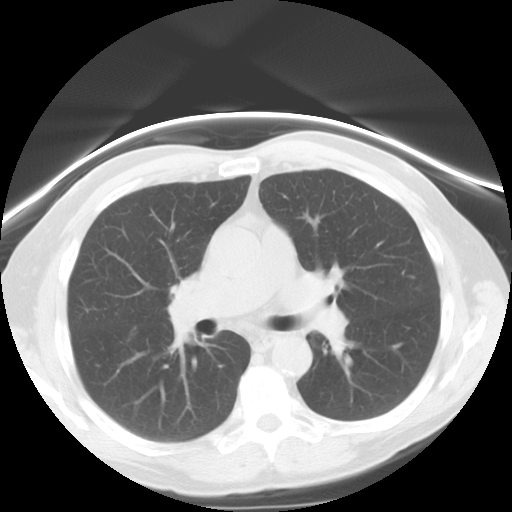
[im 34/56  lung]
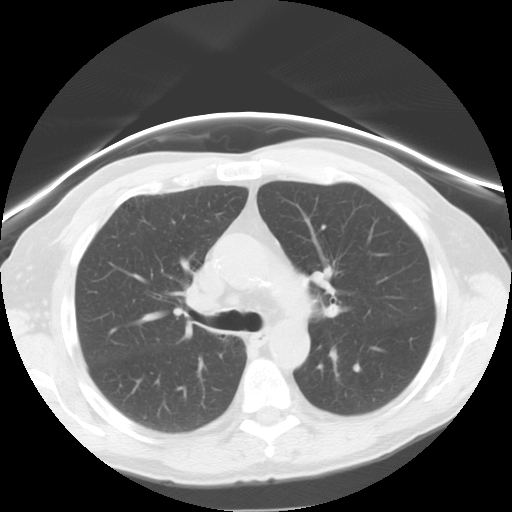
[im 39/56  mediastinal]
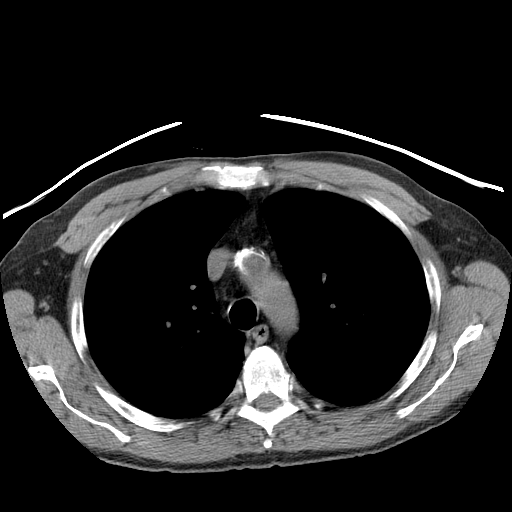
[im 39/56  lung]
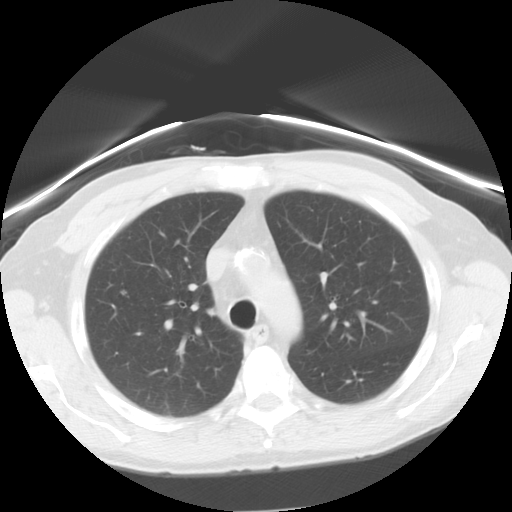
[im 43/56  lung]
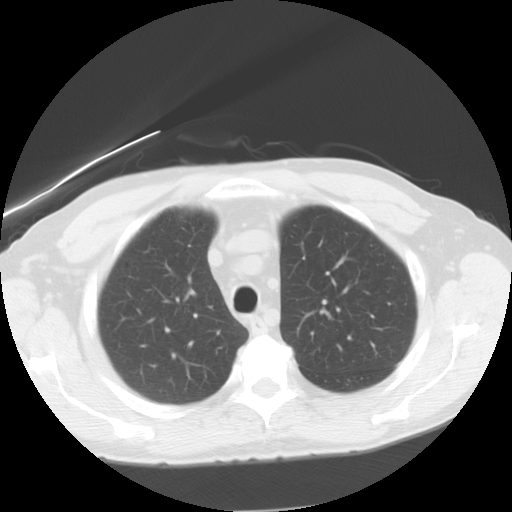
[im 47/56  lung]
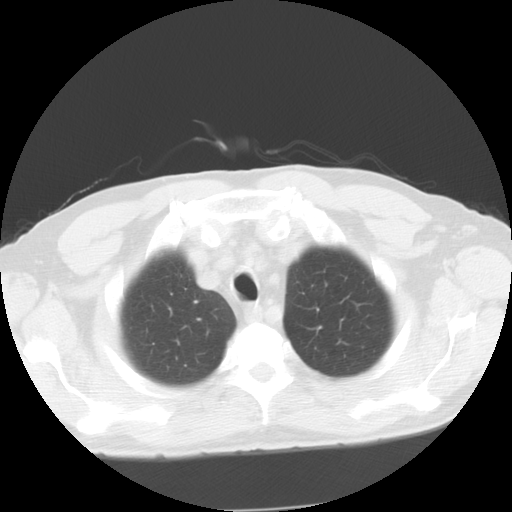
[im 51/56  lung]
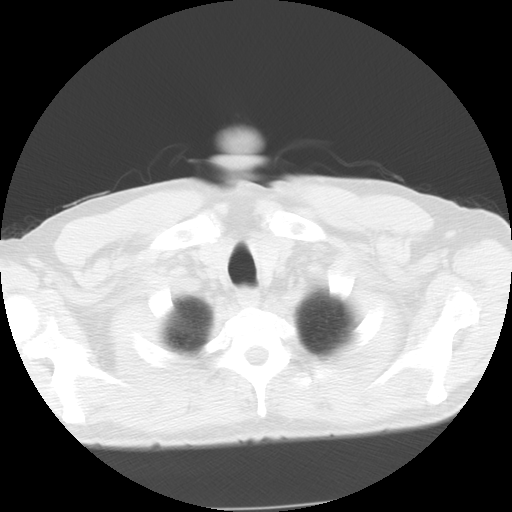

[Series 103: cor · coronal · 0.70mm/px · 3 of 106 slices shown]
[im 22/106  lung]
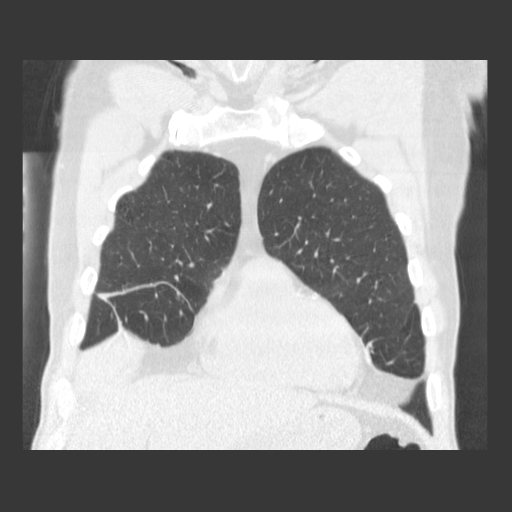
[im 43/106  lung]
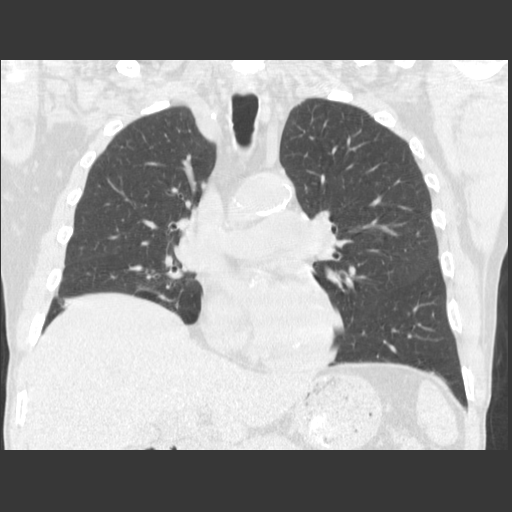
[im 64/106  lung]
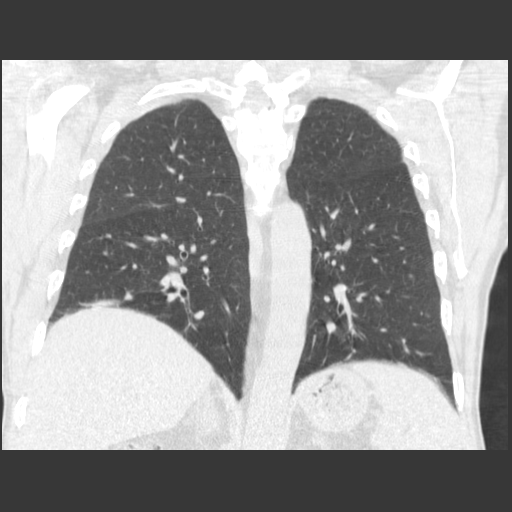

[15 of 36 positions shown; findings below may reference images not displayed]

FINDINGS: Less atelectasis at the right lung base than on the prior
exam.  Still, there are multiple areas of plate-like opacity
compatible with atelectasis, and/or scarring.  These involve the
right middle and lower lobes.  Stable 4 mm pulmonary nodule in the
lateral basal segment of the right lower lobe near the confluence
of the right major and minor fissures, allowing for differences in
slice selection.  On the same image (#31) there is a small
apparently nodular focus in the right middle lobe at this level and
that is thought related to right middle lobe scarring or
atelectasis.  No airspace consolidation.  No other nodular
pulmonary density identified.  Minor atelectasis of scarring at the
left base.  Major airways are patent.

No acute osseous abnormality identified.

There are small surgical clips just below the gastroesophageal
junction, above the proximal celiac artery.  Other visualized upper
abdominal viscera are within normal limits.  The gastric antrum is
not included on this exam.

No pericardial or pleural effusion.  Extensive coronary artery
calcifications in the left coronary vessels.  Calcified
atherosclerosis of the aortic arch.  No mediastinal or axillary
lymphadenopathy.  Visualized noncontrast thoracic inlet is within
normal limits.
IMPRESSION: 1.  Stable lateral basal segment right lower lobe 4 mm nodule x 15
months.  Given the patient's history of smoking, an additional
follow-up CT in [DATE] is recommended to document a
total of 24 months of stability.This recommendation follows the
consensus statement: "Guidelines for Management of Small Pulmonary
Nodules Detected on CT Scans:  A Statement from the [employer]
online at:  [URL]
2.  Plate-like atelectasis and/or scarring at the lung bases, right
greater than left.
3.  Coronary artery calcifications.

## 2009-05-11 ENCOUNTER — Encounter
Admission: RE | Admit: 2009-05-11 | Discharge: 2009-08-09 | Payer: Self-pay | Admitting: Physical Medicine and Rehabilitation

## 2009-05-18 ENCOUNTER — Ambulatory Visit: Payer: Self-pay | Admitting: Physical Medicine and Rehabilitation

## 2009-06-22 ENCOUNTER — Ambulatory Visit: Payer: Self-pay | Admitting: Physical Medicine and Rehabilitation

## 2009-07-20 ENCOUNTER — Ambulatory Visit: Payer: Self-pay | Admitting: Physical Medicine and Rehabilitation

## 2009-08-09 ENCOUNTER — Ambulatory Visit: Payer: Self-pay | Admitting: Vascular Surgery

## 2009-08-09 ENCOUNTER — Encounter: Payer: Self-pay | Admitting: Family Medicine

## 2009-08-12 ENCOUNTER — Encounter
Admission: RE | Admit: 2009-08-12 | Discharge: 2009-11-10 | Payer: Self-pay | Admitting: Physical Medicine and Rehabilitation

## 2009-08-17 ENCOUNTER — Ambulatory Visit: Payer: Self-pay | Admitting: Physical Medicine and Rehabilitation

## 2009-09-14 ENCOUNTER — Ambulatory Visit: Payer: Self-pay | Admitting: Physical Medicine and Rehabilitation

## 2009-10-14 ENCOUNTER — Ambulatory Visit: Payer: Self-pay | Admitting: Physical Medicine and Rehabilitation

## 2009-11-11 ENCOUNTER — Encounter
Admission: RE | Admit: 2009-11-11 | Discharge: 2009-12-15 | Payer: Self-pay | Admitting: Physical Medicine and Rehabilitation

## 2009-11-14 ENCOUNTER — Ambulatory Visit: Payer: Self-pay | Admitting: Physical Medicine and Rehabilitation

## 2009-12-09 ENCOUNTER — Encounter: Payer: Self-pay | Admitting: Family Medicine

## 2009-12-09 ENCOUNTER — Ambulatory Visit: Payer: Self-pay | Admitting: Family Medicine

## 2009-12-09 LAB — CONVERTED CEMR LAB: Hgb A1c MFr Bld: 6.8 %

## 2009-12-12 LAB — CONVERTED CEMR LAB
ALT: 15 units/L (ref 0–53)
AST: 15 units/L (ref 0–37)
Albumin: 4.2 g/dL (ref 3.5–5.2)
Alkaline Phosphatase: 76 units/L (ref 39–117)
BUN: 17 mg/dL (ref 6–23)
CO2: 26 meq/L (ref 19–32)
Calcium: 9.7 mg/dL (ref 8.4–10.5)
Chloride: 102 meq/L (ref 96–112)
Cholesterol: 103 mg/dL (ref 0–200)
Creatinine, Ser: 0.98 mg/dL (ref 0.40–1.50)
Glucose, Bld: 104 mg/dL — ABNORMAL HIGH (ref 70–99)
HDL: 32 mg/dL — ABNORMAL LOW (ref >39)
LDL Cholesterol: 50 mg/dL (ref 0–99)
Potassium: 4.1 meq/L (ref 3.5–5.3)
Sodium: 138 meq/L (ref 135–145)
Total Bilirubin: 0.3 mg/dL (ref 0.3–1.2)
Total CHOL/HDL Ratio: 3.2
Total Protein: 6.8 g/dL (ref 6.0–8.3)
Triglycerides: 105 mg/dL (ref <150)
VLDL: 21 mg/dL (ref 0–40)

## 2009-12-14 ENCOUNTER — Ambulatory Visit: Payer: Self-pay | Admitting: Physical Medicine and Rehabilitation

## 2009-12-28 ENCOUNTER — Ambulatory Visit: Payer: Self-pay | Admitting: Family Medicine

## 2009-12-28 ENCOUNTER — Telehealth: Payer: Self-pay | Admitting: Family Medicine

## 2009-12-29 ENCOUNTER — Ambulatory Visit (HOSPITAL_COMMUNITY): Admission: RE | Admit: 2009-12-29 | Discharge: 2009-12-29 | Payer: Self-pay | Admitting: Family Medicine

## 2010-01-05 ENCOUNTER — Encounter
Admission: RE | Admit: 2010-01-05 | Discharge: 2010-04-04 | Payer: Self-pay | Admitting: Physical Medicine and Rehabilitation

## 2010-01-11 ENCOUNTER — Ambulatory Visit: Payer: Self-pay | Admitting: Family Medicine

## 2010-01-12 ENCOUNTER — Ambulatory Visit: Payer: Self-pay | Admitting: Physical Medicine and Rehabilitation

## 2010-02-10 ENCOUNTER — Ambulatory Visit: Payer: Self-pay | Admitting: Physical Medicine and Rehabilitation

## 2010-03-09 ENCOUNTER — Ambulatory Visit: Payer: Self-pay | Admitting: Physical Medicine and Rehabilitation

## 2010-03-13 ENCOUNTER — Ambulatory Visit: Payer: Self-pay | Admitting: Family Medicine

## 2010-03-13 ENCOUNTER — Encounter: Payer: Self-pay | Admitting: Family Medicine

## 2010-03-22 ENCOUNTER — Telehealth: Payer: Self-pay | Admitting: *Deleted

## 2010-04-04 ENCOUNTER — Encounter
Admission: RE | Admit: 2010-04-04 | Discharge: 2010-07-03 | Payer: Self-pay | Admitting: Physical Medicine & Rehabilitation

## 2010-04-04 ENCOUNTER — Ambulatory Visit: Payer: Self-pay | Admitting: Vascular Surgery

## 2010-04-04 ENCOUNTER — Encounter: Payer: Self-pay | Admitting: Family Medicine

## 2010-04-06 ENCOUNTER — Ambulatory Visit: Payer: Self-pay | Admitting: Physical Medicine and Rehabilitation

## 2010-05-05 ENCOUNTER — Ambulatory Visit: Payer: Self-pay | Admitting: Physical Medicine and Rehabilitation

## 2010-05-31 ENCOUNTER — Ambulatory Visit: Payer: Self-pay | Admitting: Physical Medicine and Rehabilitation

## 2010-06-28 ENCOUNTER — Ambulatory Visit: Payer: Self-pay | Admitting: Physical Medicine and Rehabilitation

## 2010-07-20 ENCOUNTER — Encounter
Admission: RE | Admit: 2010-07-20 | Discharge: 2010-10-09 | Payer: Self-pay | Admitting: Physical Medicine and Rehabilitation

## 2010-07-26 ENCOUNTER — Ambulatory Visit: Payer: Self-pay | Admitting: Physical Medicine and Rehabilitation

## 2010-08-23 ENCOUNTER — Ambulatory Visit: Payer: Self-pay | Admitting: Physical Medicine and Rehabilitation

## 2010-09-05 ENCOUNTER — Ambulatory Visit: Payer: Self-pay | Admitting: Vascular Surgery

## 2010-09-20 ENCOUNTER — Ambulatory Visit: Payer: Self-pay | Admitting: Physical Medicine and Rehabilitation

## 2010-09-21 ENCOUNTER — Ambulatory Visit: Payer: Self-pay | Admitting: Internal Medicine

## 2010-09-21 LAB — CONVERTED CEMR LAB

## 2010-10-05 ENCOUNTER — Ambulatory Visit: Payer: Self-pay | Admitting: Internal Medicine

## 2010-10-09 ENCOUNTER — Encounter
Admission: RE | Admit: 2010-10-09 | Discharge: 2010-10-24 | Payer: Self-pay | Admitting: Physical Medicine & Rehabilitation

## 2010-10-17 ENCOUNTER — Ambulatory Visit: Payer: Self-pay | Admitting: Critical Care Medicine

## 2010-10-18 ENCOUNTER — Ambulatory Visit: Payer: Self-pay | Admitting: Physical Medicine and Rehabilitation

## 2010-10-18 ENCOUNTER — Encounter
Admission: RE | Admit: 2010-10-18 | Discharge: 2010-12-20 | Payer: Self-pay | Source: Home / Self Care | Attending: Physical Medicine and Rehabilitation | Admitting: Physical Medicine and Rehabilitation

## 2010-11-07 ENCOUNTER — Ambulatory Visit: Payer: Self-pay | Admitting: Internal Medicine

## 2010-11-07 LAB — CONVERTED CEMR LAB

## 2010-11-08 LAB — CONVERTED CEMR LAB
ALT: 14 units/L (ref 0–53)
Albumin: 3.8 g/dL (ref 3.5–5.2)
BUN: 19 mg/dL (ref 6–23)
Basophils Absolute: 0.1 10*3/uL (ref 0.0–0.1)
CO2: 29 meq/L (ref 19–32)
Calcium: 9.1 mg/dL (ref 8.4–10.5)
Chloride: 100 meq/L (ref 96–112)
Cholesterol: 114 mg/dL (ref 0–200)
Creatinine, Ser: 1.1 mg/dL (ref 0.4–1.5)
HDL: 25.9 mg/dL — ABNORMAL LOW (ref >39.00)
Lymphocytes Relative: 23.8 % (ref 12.0–46.0)
Lymphs Abs: 2 10*3/uL (ref 0.7–4.0)
Monocytes Relative: 9.2 % (ref 3.0–12.0)
Neutrophils Relative %: 52.1 % (ref 43.0–77.0)
PSA: 0.51 ng/mL (ref 0.10–4.00)
Platelets: 286 10*3/uL (ref 150.0–400.0)
RDW: 14.8 % — ABNORMAL HIGH (ref 11.5–14.6)
TSH: 0.78 microintl units/mL (ref 0.35–5.50)
Total Protein: 6.6 g/dL (ref 6.0–8.3)
Triglycerides: 150 mg/dL — ABNORMAL HIGH (ref 0.0–149.0)

## 2010-11-15 ENCOUNTER — Encounter: Payer: Self-pay | Admitting: Family Medicine

## 2010-11-15 ENCOUNTER — Ambulatory Visit: Payer: Self-pay | Admitting: Physical Medicine and Rehabilitation

## 2010-11-15 ENCOUNTER — Encounter: Payer: Self-pay | Admitting: Physical Medicine and Rehabilitation

## 2010-11-15 ENCOUNTER — Ambulatory Visit
Admission: RE | Admit: 2010-11-15 | Discharge: 2010-11-15 | Payer: Self-pay | Source: Home / Self Care | Admitting: Physical Medicine and Rehabilitation

## 2010-11-15 DIAGNOSIS — I749 Embolism and thrombosis of unspecified artery: Secondary | ICD-10-CM | POA: Insufficient documentation

## 2010-11-20 ENCOUNTER — Encounter: Payer: Self-pay | Admitting: Family Medicine

## 2010-11-20 ENCOUNTER — Ambulatory Visit: Payer: Self-pay | Admitting: Internal Medicine

## 2010-11-29 ENCOUNTER — Ambulatory Visit: Payer: Self-pay | Admitting: Physical Medicine and Rehabilitation

## 2010-12-25 ENCOUNTER — Encounter
Admission: RE | Admit: 2010-12-25 | Discharge: 2011-01-23 | Payer: Self-pay | Source: Home / Self Care | Attending: Physical Medicine and Rehabilitation | Admitting: Physical Medicine and Rehabilitation

## 2011-01-02 ENCOUNTER — Ambulatory Visit
Admission: RE | Admit: 2011-01-02 | Discharge: 2011-01-02 | Payer: Self-pay | Source: Home / Self Care | Attending: Physical Medicine and Rehabilitation | Admitting: Physical Medicine and Rehabilitation

## 2011-01-05 ENCOUNTER — Ambulatory Visit: Payer: Self-pay | Admitting: Cardiology

## 2011-01-08 ENCOUNTER — Ambulatory Visit
Admission: RE | Admit: 2011-01-08 | Discharge: 2011-01-08 | Payer: Self-pay | Source: Home / Self Care | Attending: Family Medicine | Admitting: Family Medicine

## 2011-01-08 DIAGNOSIS — R221 Localized swelling, mass and lump, neck: Secondary | ICD-10-CM

## 2011-01-08 DIAGNOSIS — R220 Localized swelling, mass and lump, head: Secondary | ICD-10-CM | POA: Insufficient documentation

## 2011-01-15 ENCOUNTER — Encounter: Payer: Self-pay | Admitting: Family Medicine

## 2011-01-15 ENCOUNTER — Ambulatory Visit: Admission: RE | Admit: 2011-01-15 | Discharge: 2011-01-15 | Payer: Self-pay | Source: Home / Self Care

## 2011-01-15 ENCOUNTER — Other Ambulatory Visit: Payer: Self-pay | Admitting: Family Medicine

## 2011-01-15 ENCOUNTER — Ambulatory Visit
Admission: RE | Admit: 2011-01-15 | Discharge: 2011-01-15 | Payer: Self-pay | Source: Home / Self Care | Attending: Family Medicine | Admitting: Family Medicine

## 2011-01-15 ENCOUNTER — Ambulatory Visit: Admit: 2011-01-15 | Payer: Self-pay | Admitting: Critical Care Medicine

## 2011-01-15 LAB — CBC WITH DIFFERENTIAL/PLATELET
Basophils Absolute: 0.1 10*3/uL (ref 0.0–0.1)
Basophils Relative: 0.7 % (ref 0.0–3.0)
Eosinophils Absolute: 1 10*3/uL — ABNORMAL HIGH (ref 0.0–0.7)
Eosinophils Relative: 11 % — ABNORMAL HIGH (ref 0.0–5.0)
HCT: 40.8 % (ref 39.0–52.0)
Hemoglobin: 13.7 g/dL (ref 13.0–17.0)
Lymphocytes Relative: 23.2 % (ref 12.0–46.0)
Lymphs Abs: 2.2 10*3/uL (ref 0.7–4.0)
MCHC: 33.7 g/dL (ref 30.0–36.0)
MCV: 86.1 fl (ref 78.0–100.0)
Monocytes Absolute: 0.9 10*3/uL (ref 0.1–1.0)
Monocytes Relative: 9.3 % (ref 3.0–12.0)
Neutro Abs: 5.2 10*3/uL (ref 1.4–7.7)
Neutrophils Relative %: 55.8 % (ref 43.0–77.0)
Platelets: 292 10*3/uL (ref 150.0–400.0)
RBC: 4.73 Mil/uL (ref 4.22–5.81)
RDW: 15.1 % — ABNORMAL HIGH (ref 11.5–14.6)
WBC: 9.3 10*3/uL (ref 4.5–10.5)

## 2011-01-22 ENCOUNTER — Ambulatory Visit: Admit: 2011-01-22 | Payer: Self-pay | Admitting: Physical Medicine and Rehabilitation

## 2011-01-23 NOTE — Miscellaneous (Signed)
Summary: call from pain clinic, then doppler  Clinical Lists Changes  Call from Dr. Rivka [facility] about LLE edema, tenderness, sent for [place] asks if they can call results to me.  Call from [facility] doppler US - no evidence of DVT, + chronic thrombi distal saphenous vein, didn't measure how far from popliteal junction.  No evidence of phlebitis currently per preliminary read.    called patient and discussed.  LLE, swelling and pain, some warmth but not anymore, no redness or fevers/chills.  Started hurting x 2 wks.  NSAIDs tear up stomache (PUD and GERD).  treat with low dose NSAID (advil 200-400mg  once daily-two times a day).  compression stockings to pick up script here (printed out).  elevate legs.  discussed other option of AC, pt prefers to try above first.  Advised if any worsening pain, worsening swelling/redness, or any SOB, chest pain, reasons to go to ER for eval.  Can we call patient and schedule f/u appt Monday with me? Eustaquio Boyden  MD  November 15, 2010 1:58 PM   Appt scheduled. 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) Medications: Added new medication of * COMPRESSION STOCKINGS class II - 20-48mm Hg bilateral legs 453.9 - Signed Rx of COMPRESSION STOCKINGS class II - 20-75mm Hg bilateral legs 453.9;  #1 x 1;  Signed;  Entered by: Eustaquio Boyden  MD;  Authorized by: Eustaquio Boyden  MD;  Method used: Print then Give to Patient Observations: Added new observation of PRIMARY MD: Eustaquio Boyden  MD (11/15/2010 11:50)    Prescriptions: COMPRESSION STOCKINGS class II - 20-73mm Hg bilateral legs 453.9  #1 x 1   Entered and Authorized by:   Eustaquio Boyden  MD   Signed by:   Eustaquio Boyden  MD on 11/15/2010   Method used:   Print then Give to Patient   RxID:   [id number]

## 2011-01-23 NOTE — Miscellaneous (Signed)
Summary: ROI  ROI   Imported By: Clydell Hakim 03/14/2010 13:58:26  _____________________________________________________________________  External Attachment:    Type:   Image     Comment:   External Document

## 2011-01-23 NOTE — Progress Notes (Signed)
Summary: triage  Phone Note Call from Patient Call back at Home Phone [phone]   Caller: Patient Summary of Call: Pt fell the other day and hit his ribs they are still very sore and having a little trouble breathing.   Initial call taken by: Clydell Hakim,  December 28, 2009 8:36 AM  Follow-up for Phone Call        fell off a high bed. landed on the step stool that is used to get into the bed.. hurts to breathe deeply or cough. will see Dr. Mauricio Po at 11. aware pcp will not be seeing him  Follow-up by: Golden Circle RN,  December 28, 2009 8:38 AM  Additional Follow-up for Phone Call Additional follow up Details #1::        Will follow up visit with Dr. Mauricio Po.  Additional Follow-up by: Bobby Rumpf  MD,  December 28, 2009 10:05 AM

## 2011-01-23 NOTE — Assessment & Plan Note (Signed)
Summary: f/u meds,df   Vital Signs:  Patient profile:   65 year old male Height:      66.5 inches Weight:      158.3 pounds BMI:     25.26 Temp:     98.6 degrees F oral Pulse rate:   69 / minute BP sitting:   118 / 65  (left arm) Cuff size:   regular  Vitals Entered By: Garen Grams LPN (March 13, 2010 2:03 PM) CC: f/u meds Is Patient Diabetic? Yes Did you bring your meter with you today? No   Primary Care Provider:  Bobby Rumpf  MD  CC:  f/u meds.  History of Present Illness: 1)  DM type 2: A1C today = 6.7. No polyuria, polydypsia or vision change. Has not seen ophthalmologist in at least one year; has been unable to schedule appointment due to being busy.Taking all medications as directed w/o side effects. Checks CBGs; fasting usually 70's to 100's. No hypoglycemic symptoms.  2) Tobacco abuse: Actively trying to quit; down to less than 1/2 pack per day. Has not been able to afford nicotine gum recently. Wife with lung disease, recent PNA.  Does better when able to be outside. Nicotine gum has helped. No worsening cough, no dyspnea, no sputum production. Wants to quit completely but just craves cigarettes on some days.   3) HTN: No chest pain or dyspnea, or vision change or LE edema. Well controlled. Taking all medications. Pressures usually 118-120's / 60's - 70's. Exercising as before.   4) H/O solitary pulmonary nodule: CT scan in 04/10 showed no change; followup recommended at 1 year mark. no worsening cough, chest pain, dyspnea, weight loss, night sweats. Continues to smoke. (4mm RLL)   5)  h/o right carotid endarterctomy:  Per follow up report - (02/01/09)  No right internal carotid artery stenosis. Occluded left internal carotid artery. Patient reports last follow up with Dr. Hart [facility] was in August 2010; good report.   6) HLD: Off Crestor for one month due to price. Does not want to switch to cheaper. HDL not at goal last visit. LDL at goal.     Habits & Providers  Alcohol-Tobacco-Diet     Tobacco Status: current  Current Medications (verified): 1)  Crestor 10 Mg Tabs (Rosuvastatin Calcium) .[name].. 1 By Mouth in The Evening 2)  Enalapril-Hydrochlorothiazide 10-25 Mg Tabs (Enalapril-Hydrochlorothiazide) .... Take 2 Tablet By Mouth Once A Day 3)  Gabapentin 300 Mg Tabs (Gabapentin) .... Take 1 Tablet By Mouth Three Times A Day 4)  Glucotrol 10 Mg Tabs (Glipizide) .... Take 1 Tablet By Mouth Twice A Day 5)  Metformin Hcl 750 Mg  Tb24 (Metformin Hcl) .[name].. 1 By Mouth Qday 6)  Ms Contin 30 Mg Tb12 (Morphine Sulfate) .... Take 1 Tablet By Mouth Twice A Day 7)  Prilosec Otc 20 Mg Tbec (Omeprazole Magnesium) .... One Tab By Mouth Qday 8)  Norvasc 5 Mg Tabs (Amlodipine Besylate) .[name].. 1 Tablet By Mouth Once A Day 9)  Toprol Xl 100 Mg Tb24 (Metoprolol Succinate) .... Take 1 1/2 Tablet By Mouth Once A Day 10)  Niaspan 750 Mg  Tbcr (Niacin (Antihyperlipidemic)) .[name].. 1 By Mouth Qday 11)  Ambien 5 Mg Tabs (Zolpidem Tartrate) 12)  Cvs Nicotine Polacrilex 2 Mg Gum (Nicotine Polacrilex) 13)  Lidoderm 5 % Ptch (Lidocaine) .... Use As Directed 28)  Futuro Male Rib Statistician (Elastic Bandages & Supports) .... Sig: Use Rib Belt As Directed  Allergies (  verified): No Known Drug Allergies  Past History:  Past Medical History: Last updated: 07/21/2008 Solitary pulmonary nodule HTN HLD T2 DM GERD Tobacco abuse  Other MDs: Cards- dr. gamble GI- dr. schooler surgery- dr. gross narcotics handled by pain center ortho- dr. Marciano Sequin  Past Surgical History: Last updated: 03/30/2009  Right carotid endarterectomy with Dacron patch angioplasty, w/ resection of redundant carotid artery with primary reanastomosis (05/09) Laparoscopic gastrojejunostomy and Laparoscopic high selective anterior and posterior vagotomies. 5/08 Back surgery, cspine surgery Knee surgery  Physical Exam  General:  Well-developed,well-nourished,in no acute distress;  alert,appropriate and cooperative throughout examination Eyes:  PERRL, EOMI, normal fundi  Neck:  quiet carotid bruit on right unable to palpate craotid pulse on left   Lungs:  Normal respiratory effort, chest expands symmetrically. Lungs are clear to auscultation, no crackles or wheezes. Heart:  Normal rate and regular rhythm. S1 and S2 normal without gallop, murmur, click, rub or other extra sounds. Abdomen:  Abdomen soft and nontender, nondistended. No organomegaly. No masses  Pulses:  2+ radials  Extremities:  no edema  Neurologic:  alert & oriented X3, sensation intact to light touch, and sensation intact to pinprick bilateral lower extremities.  Diabetes Management Exam:    Foot Exam (with socks and/or shoes not present):       Sensory-Pinprick/Light touch:          Left medial foot (L-4): normal          Left dorsal foot (L-5): normal          Left lateral foot (S-1): normal          Right medial foot (L-4): normal          Right dorsal foot (L-5): normal          Right lateral foot (S-1): normal       Sensory-Monofilament:          Left foot: normal          Right foot: normal       Inspection:          Left foot: normal          Right foot: normal       Nails:          Left foot: normal          Right foot: normal   Impression & Recommendations:  Problem # 1:  HYPERTENSION, BENIGN SYSTEMIC (ICD-401.1) Assessment Unchanged  Well controlled. Follow in 4 months.  His updated medication list for this problem includes:    Enalapril-hydrochlorothiazide 10-25 Mg Tabs (Enalapril-hydrochlorothiazide) .[name]... Take 2 tablet by mouth once a day    Norvasc 5 Mg Tabs (Amlodipine besylate) .[name]... 1 tablet by mouth once a day    Toprol Xl 100 Mg Tb24 (Metoprolol succinate) .[name]... Take 1 1/2 tablet by mouth once a day  BP today: 118/65 Prior BP: 119/65 (12/28/2009)  Labs Reviewed: K+: 4.1 (12/09/2009) Creat: : 0.98 (12/09/2009)   Chol: 103 (12/09/2009)   HDL: 32 (12/09/2009)   LDL:  50 (12/09/2009)   TG: 105 (12/09/2009)  Orders: [facility]- Est  Level 4 (27253)  Problem # 2:  DIABETES MELLITUS II, UNCOMPLICATED (ICD-250.00) Assessment: Unchanged Well controlled. Follow at 4 months.   His updated medication list for this problem includes:    Enalapril-hydrochlorothiazide 10-25 Mg Tabs (Enalapril-hydrochlorothiazide) .[name]... Take 2 tablet by mouth once a day    Glucotrol 10 Mg Tabs (Glipizide) .[name]... Take 1 tablet by mouth twice a day  Metformin Hcl 750 Mg Tb24 (Metformin hcl) .[name]... 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 Total occlusion of left. Will obtain records [facility] Will discuss with Dr. Hart Rochester pursuing carotid Dopplers to assess flow.    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 CT in April 2011. (no change on prior CT).   Problem # 5:  TOBACCO DEPENDENCE (ICD-305.1) Assessment: Unchanged  Actively quitting. Ecnouraged cessation. Would be a good candidate for smoking cessation class.  His updated medication list for this problem includes:    Cvs Nicotine Polacrilex 2 Mg Gum (Nicotine polacrilex)  Orders: [facility]- Est  Level 4 (91478)  Complete Medication List: 1)  Crestor 10 Mg Tabs (Rosuvastatin calcium) .[name].. 1 by mouth in the evening 2)  Enalapril-hydrochlorothiazide 10-25 Mg Tabs (Enalapril-hydrochlorothiazide) .... Take 2 tablet by mouth once a day 3)  Gabapentin 300 Mg Tabs (Gabapentin) .... Take 1 tablet by mouth three times a day 4)  Glucotrol 10 Mg Tabs (Glipizide) .... Take 1 tablet by mouth twice a day 5)  Metformin Hcl 750 Mg Tb24 (Metformin hcl) .[name].. 1 by mouth qday 6)  Ms  Contin 30 Mg Tb12 (Morphine sulfate) .... Take 1 tablet by mouth twice a day 7)  Prilosec Otc 20 Mg Tbec (Omeprazole magnesium) .... One tab by mouth qday 8)  Norvasc 5 Mg Tabs (Amlodipine besylate) .[name].. 1 tablet by mouth once a day 9)  Toprol Xl 100 Mg Tb24 (Metoprolol succinate) .... Take 1 1/2 tablet by mouth once a day 10)  Niaspan 750 Mg Tbcr (Niacin (antihyperlipidemic)) .[name].. 1 by mouth qday 11)  Ambien 5 Mg Tabs (Zolpidem tartrate) 12)  Cvs Nicotine Polacrilex 2 Mg Gum (Nicotine polacrilex) 13)  Lidoderm 5 % Ptch (Lidocaine) .... Use as directed 20)  Futuro Male Rib Belt Misc (Elastic bandages & supports) .... Sig: use rib belt as directed  Patient Instructions: 1)   It was great to see you today!  2)    Keep taking all your medications.  3)   Go to orthopedic referral when you are ready for surgery 4)   Keep working on strategies to quit smoking, including your gum. 5)   Please schedule a follow-up appointment in 4 months. 6)  Go to eye doctor as we discussed. 7)  Keep exercising.  8)   I hope your wife feels better.  9)  Make an appointment with your eye doctor soon to check for diabetic eye damage. 10)   Check your feet each night  for sore areas, calluses or signs of infection. Check your blood sugars regularly.  11)    12)  Check your blood pressures regularly  Laboratory Results   Blood Tests   Date/Time Received: March 13, 2010 1:56 PM  Date/Time Reported: March 13, 2010 2:09 PM   HGBA1C: 6.7%   (Normal Range: Non-Diabetic - 3-6%   Control Diabetic - 6-8%)  Comments: ...............test performed by.................Marland KitchenGaren Grams, LPN .............entered by...........Marland KitchenBonnie A. [place], MLS (ASCP)cm         Prevention & Chronic Care Immunizations   Influenza vaccine: Fluvax MCR  (01/11/2010)   Influenza vaccine due: 10/05/2009    Tetanus booster:  08/24/1998: Done.   Tetanus booster due: 08/24/2008    Pneumococcal vaccine: Done.  (11/23/2006)    Pneumococcal vaccine due: None    H. zoster vaccine: Not documented  Colorectal Screening   Hemoccult: .  (10/19/2008)   Hemoccult due: Not Indicated    Colonoscopy: diverticulosis, f/u in 10 yrs  (02/22/2007)   Colonoscopy due: 02/21/2017  Other Screening   PSA: 0.57  (03/12/2007)   PSA due due: 03/11/2008   Smoking status: current  (03/13/2010)   Smoking cessation counseling: yes  (10/19/2008)  Diabetes Mellitus   HgbA1C: 6.7  (03/13/2010)   HgbA1C action/deferral: Ordered  (03/13/2010)   Hemoglobin A1C due: 06/13/2010    Eye exam: Not documented   Diabetic eye exam action/deferral: Ophthalmology referral  (03/13/2010)    Foot exam: yes  (03/13/2010)   Foot exam action/deferral: Do today   High risk foot: Not documented   Foot care education: Not documented   Foot exam due: 06/13/2010    Urine microalbumin/creatinine ratio: Not documented    Diabetes flowsheet reviewed?: Yes   Progress toward A1C goal: At goal  Lipids   Total Cholesterol: 103  (12/09/2009)   Lipid panel action/deferral: Lipid Panel ordered   LDL: 50  (12/09/2009)   LDL Direct: 56  (03/16/2008)   HDL: 32  (12/09/2009)   Triglycerides: 105  (12/09/2009)   Lipid panel due: 06/09/2010    SGOT (AST): 15  (12/09/2009)   SGPT (ALT): 15  (12/09/2009)   Alkaline phosphatase: 76  (12/09/2009)   Total bilirubin: 0.3  (12/09/2009)   Liver panel due: 06/09/2010    Lipid flowsheet reviewed?: Yes   Progress toward LDL goal: At goal  Hypertension   Last Blood Pressure: 118 / 65  (03/13/2010)   Serum creatinine: 0.98  (12/09/2009)   BMP action: Ordered   Serum potassium 4.1  (12/09/2009)   Basic metabolic panel due: 06/09/2010    Hypertension flowsheet reviewed?: Yes   Progress toward BP goal: At goal  Self-Management Support :   Personal Goals (by the next clinic visit) :     Personal A1C goal: 7  (12/09/2009)     Personal blood pressure goal: 130/80  (12/09/2009)     Personal LDL goal: 70   (12/09/2009)    Patient will work on the following items until the next clinic visit to reach self-care goals:     Medications and monitoring: take my medicines every day, check my blood sugar, check my blood pressure, bring all of my medications to every visit, weigh myself weekly, examine my feet every day  (03/13/2010)     Eating: drink diet soda or water instead of juice or soda, eat more vegetables, use fresh or frozen vegetables, eat foods that are low in salt, eat baked foods instead of fried foods, eat fruit for snacks and desserts, limit or avoid alcohol  (03/13/2010)     Activity: take a 30 minute walk every day  (03/13/2010)    Diabetes self-management support: Not documented    Diabetes self-management support not done because: Good outcomes  (12/09/2009)    Hypertension self-management support: BP self-monitoring log, Written self-care plan, Education handout, Pre-printed educational material  (12/09/2009)    Hypertension self-management support not done because: Good outcomes  (03/13/2010)    Lipid self-management support: Not documented     Lipid self-management support not done because: Good outcomes  (03/13/2010)   Appended Document: Orders Update Will order carotid Dopplers based on new exam finding of bruit, after discussion  with Dr. Hart [facility] at [facility]. Will call patient to let him know plan.  Bobby Rumpf  MD  March 20, 2010 3:15 PM   Clinical Lists Changes  Orders: Added new Test order of Carotid Doppler (Carotid doppler) - Signed

## 2011-01-23 NOTE — Consult Note (Signed)
Summary: [facility]  [facility]   Imported By: Clydell Hakim 04/27/2010 16:19:37  _____________________________________________________________________  External Attachment:    Type:   Image     Comment:   External Document

## 2011-01-23 NOTE — Assessment & Plan Note (Signed)
Summary: follow up    Vital Signs:  Patient profile:   65 year old male Height:      64 inches Weight:      152 pounds BMI:     26.19 Temp:     98.4 degrees F oral Pulse rate:   66 / minute Pulse rhythm:   regular BP sitting:   120 / 72  (left arm) Cuff size:   regular  Vitals Entered ByMelody Comas (November 20, 2010 2:16 PM) CC: follow-up visit   History of Present Illness: CC: f/u LLE subacute superficial thrombus  2-3 wk h/o L lower leg pain.  hadn't paid much attention because has intermittent leg pains on and off 2/2 back pain, surgeries.  Seen last week by Dr. Pamelia Hoit who obtained LLE dopplers and pt was found to have LLE subacute superficial thrombus of greater saphenous vein.  Did turn red/swollen one night, not since.    taking 1-2 advil 200mg  daily.  Hasn't gotten compression stockings yet.  Staying off leg, elevating leg.  tender anterior/medial shin.    smoking - down to 1/4 ppd.  cold [place].    UTD colonsocopy, PSA/DRE reassuring last CPE (1 mo ago), has pulm nodules and followed by Dr. Delford Field with rpt CT scan in next few months.  no recent immobility, no recent surgeries.  no h/o DVT/PE in past.  + h/o HTN, HLD, GERD/PUD, TIA s/p CEA on ASA81mg  daily.  Current Medications (verified): 1)  Aspirin 81 Mg Tbec (Aspirin) .... One Daily 2)  Enalapril-Hydrochlorothiazide 10-25 Mg Tabs (Enalapril-Hydrochlorothiazide) .... Take 2 Tablet By Mouth Once A Day 3)  Toprol Xl 100 Mg Tb24 (Metoprolol Succinate) .... Take 1 1/2 Tablet By Mouth Once A Day 4)  Norvasc 5 Mg Tabs (Amlodipine Besylate) .[name].. 1 Tablet By Mouth Once A Day 5)  Crestor 10 Mg Tabs (Rosuvastatin Calcium) .[name].. 1 By Mouth in The Evening 6)  Glucotrol 10 Mg Tabs (Glipizide) .... Take 1 Tablet By Mouth Twice A Day 7)  Metformin Hcl 750 Mg  Tb24 (Metformin Hcl) .[name].. 1 By Mouth Qday 8)  Ms Contin 30 Mg Tb12 (Morphine Sulfate) .... Take 1 Tablet By Mouth Once A Day 9)  Gabapentin 300 Mg Tabs (Gabapentin)  .... Take 1 Tablet By Mouth Three Times A Day 10)  Lidoderm 5 % Ptch (Lidocaine) .... Use As Directed 11)  Ambien 5 Mg Tabs (Zolpidem Tartrate) .... As Needed 12)  Prilosec Otc 20 Mg Tbec (Omeprazole Magnesium) .... One Tab By Mouth Two Times A Day 13)  Cvs Nicotine Polacrilex 2 Mg Gum (Nicotine Polacrilex) 14)  Hydrocodone-Acetaminophen 7.5-325 Mg Tabs (Hydrocodone-Acetaminophen) .[name].. 1 Tablet Every 4 Hours 15)  Nicotrol 10 Mg Inha (Nicotine) .... Use 6-8 Cartridges Per Day, 60-80 Puffs Per Cartridge 16)  Compression Stockings .... Class Ii - 20-48mm Hg Bilateral Legs 453.9  Allergies (verified): No Known Drug Allergies  Past History:  Social History: Last updated: 10/17/2010 Current smoker.  1/2 ppd x 20 yrs. No alcohol or drug abuse. 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  Past Medical History: Solitary pulmonary nodule (enlarged 2011) HTN HLD T2 DM GERD, PUD  Tobacco abuse TIA s/p R CEA (2009) [place] chronic low back pain hospitalization 03/2008 - GOO 2/2 chronic PUD s/p GJostomy/vagotomy, complicated by R wrist septic arthritis, delirium  Other MDs: Cards- dr. gamble GI- dr. Bosie Clos [facility] surgery- dr. gross narcotics handled by pain center  Dr. Pamelia Hoit ortho- dr. rendell/murphy vasc- Dr. Hart [place]  Social History: Reviewed history from 10/17/2010 and no changes required. Current smoker.  1/2 ppd x 20 yrs. No alcohol or drug abuse. 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  Review of Systems       per HPI  Physical Exam  General:  Well-developed,well-nourished,in no acute distress; alert,appropriate and cooperative throughout examination Pulses:  2+ DP/PT bilaterally Extremities:  no significant edema bilaterally, no erythema.  + induration L anteriomedial shin along greater saphenous  vein   Impression & Recommendations:  Problem # 1:  SUPERFICIAL VEIN THROMBOSIS (ICD-453.9) L greater saphenous vein.  no evidence of phlebitis curretnly, no need for abx.  treat with low dose NSAIDs (2/2 hx PUD), compression, elevation.  h/o eosinophilia 2 wks ago at CBC but normal WBC.  recheck next blood draw.  Problem # 2:  TOBACCO DEPENDENCE (ICD-305.1) Encouraged smoking cessation   His updated medication list for this problem includes:    Cvs Nicotine Polacrilex 2 Mg Gum (Nicotine polacrilex)    Nicotrol 10 Mg Inha (Nicotine) ..... Use 6-8 cartridges per day, 60-80 puffs per cartridge  Complete Medication List: 1)  Aspirin 81 Mg Tbec (Aspirin) .... One daily 2)  Enalapril-hydrochlorothiazide 10-25 Mg Tabs (Enalapril-hydrochlorothiazide) .... Take 2 tablet by mouth once a day 3)  Toprol Xl 100 Mg Tb24 (Metoprolol succinate) .... Take 1 1/2 tablet by mouth once a day 4)  Norvasc 5 Mg Tabs (Amlodipine besylate) .[name].. 1 tablet by mouth once a day 5)  Crestor 10 Mg Tabs (Rosuvastatin calcium) .[name].. 1 by mouth in the evening 6)  Glucotrol 10 Mg Tabs (Glipizide) .... Take 1 tablet by mouth twice a day 7)  Metformin Hcl 750 Mg Tb24 (Metformin hcl) .[name].. 1 by mouth qday 8)  Ms Contin 30 Mg Tb12 (Morphine sulfate) .... Take 1 tablet by mouth once a day 9)  Gabapentin 300 Mg Tabs (Gabapentin) .... Take 1 tablet by mouth three times a day 10)  Lidoderm 5 % Ptch (Lidocaine) .... Use as directed 11)  Ambien 5 Mg Tabs (Zolpidem tartrate) .... As needed 12)  Prilosec Otc 20 Mg Tbec (Omeprazole magnesium) .... One tab by mouth two times a day 13)  Cvs Nicotine Polacrilex 2 Mg Gum (Nicotine polacrilex) 14)  Hydrocodone-acetaminophen 7.5-325 Mg Tabs (Hydrocodone-acetaminophen) .[name].. 1 tablet every 4 hours 15)  Nicotrol 10 Mg Inha (Nicotine) .... Use 6-8 cartridges per day, 60-80 puffs per cartridge 16)  Compression Stockings  .... Class ii - 20-29mm hg bilateral legs 453.9  Patient Instructions: 1)   Compression stockings. 2)  Continue advil 2-3/day. 3)  Continue to cut back on smoking. 4)  If leg pain or swelling or redness getting worse, please return to be seen. 5)  If any shortness of breath or chest pain, please seek urgent medical care.   Orders Added: 1)  Est. Patient Level III [48546]    Current Allergies (reviewed today): No known allergies

## 2011-01-23 NOTE — Progress Notes (Signed)
Summary: phn msg  Phone Note Call from Patient Call back at Home Phone [phone]   Caller: Patient Summary of Call: Pt calling to see if he has any appts that have been scheduled for him on 03/27/10 if so he can not go that day. Initial call taken by: Clydell Hakim,  March 22, 2010 2:27 PM  Follow-up for Phone Call        Patient informed that we did not have any appts scheduled for him on that date and that [facility] would be calling him this week to set up a date for his carotid dopplers. Follow-up by: Garen Grams LPN,  March 22, 2010 2:37 PM

## 2011-01-23 NOTE — Miscellaneous (Signed)
Summary: [facility]       Summary of [facility] office visit on date 08/09/09    Seen by Dr. Josephina Gip for one year follow up s/op right carotid endarterectomy. Known left internal occlusion. No symptoms currently.  BP 105 / 50. HR 64, RR 14 Carotid pulses 3+ w/o audible bruit. Neuro exam normal. CV, pulm exam normal  Carotid duplex w/ RICA 40-59% stenosis (by velocity criteria), but no significant stenosis visualized.  LICA w/ known occlusion.

## 2011-01-23 NOTE — Assessment & Plan Note (Signed)
Summary: CPX/LABS/[facility]H   Vital Signs:  Patient profile:   65 year old male Weight:      157 pounds Temp:     98.8 degrees F oral Pulse rate:   60 / minute Pulse rhythm:   regular BP sitting:   120 / 78  (left arm) Cuff size:   regular  Vitals Entered By: [name] Dance CMA Duncan Dull) (November 07, 2010 8:41 AM) CC: CPx   History of Present Illness: CC: CPE  Harvie Heck wife feeling ill at home.    Pulm nodule - Dr. Delford Field - to rpt CT scan in 3 months.  if enlarged, may remove.  smoking - given script for nicotrol cartridge by pulm.  wife to try his and Dr. Vassie Loll may give her a script.  down to  ~1/2 ppd.  DM - compliant with meds.  no concerns, no lows  preventative:  UTD flu, tetanus, PNA UTD colon due for prostate/PSA.  Current Medications (verified): 1)  Aspirin 81 Mg Tbec (Aspirin) .... One Daily 2)  Enalapril-Hydrochlorothiazide 10-25 Mg Tabs (Enalapril-Hydrochlorothiazide) .... Take 2 Tablet By Mouth Once A Day 3)  Toprol Xl 100 Mg Tb24 (Metoprolol Succinate) .... Take 1 1/2 Tablet By Mouth Once A Day 4)  Norvasc 5 Mg Tabs (Amlodipine Besylate) .[name].. 1 Tablet By Mouth Once A Day 5)  Crestor 10 Mg Tabs (Rosuvastatin Calcium) .[name].. 1 By Mouth in The Evening 6)  Glucotrol 10 Mg Tabs (Glipizide) .... Take 1 Tablet By Mouth Twice A Day 7)  Metformin Hcl 750 Mg  Tb24 (Metformin Hcl) .[name].. 1 By Mouth Qday 8)  Ms Contin 30 Mg Tb12 (Morphine Sulfate) .... Take 1 Tablet By Mouth Once A Day 9)  Gabapentin 300 Mg Tabs (Gabapentin) .... Take 1 Tablet By Mouth Three Times A Day 10)  Lidoderm 5 % Ptch (Lidocaine) .... Use As Directed 11)  Ambien 5 Mg Tabs (Zolpidem Tartrate) .... As Needed 12)  Prilosec Otc 20 Mg Tbec (Omeprazole Magnesium) .... One Tab By Mouth Two Times A Day 13)  Cvs Nicotine Polacrilex 2 Mg Gum (Nicotine Polacrilex) 14)  Hydrocodone-Acetaminophen 7.5-325 Mg Tabs (Hydrocodone-Acetaminophen) .[name].. 1 Tablet Every 4 Hours 15)  Nicotrol 10 Mg Inha (Nicotine) .... Use 6-8 Cartridges  Per Day, 60-80 Puffs Per Cartridge  Allergies (verified): No Known Drug Allergies  Family History: F: CABG 6v, MI, arthritis M:Alz, arthritis 2 sisters bipolar  No CA, DM, CVA  Review of Systems       per HPI  Physical Exam  General:  Well-developed,well-nourished,in no acute distress; alert,appropriate and cooperative throughout examination Eyes:  PERRL, EOMI Ears:  External ear exam shows no significant lesions or deformities.  Otoscopic examination reveals clear canals, tympanic membranes are intact bilaterally without bulging, retraction, inflammation or discharge. Hearing is grossly normal bilaterally. Nose:  External nasal examination shows no deformity or inflammation. Nasal mucosa are pink and moist without lesions or exudates. Mouth:  Oral mucosa and oropharynx without lesions or exudates.  Teeth in good repair. Neck:  No deformities, masses, or tenderness noted.  no bruits Lungs:  coarse breath sounds, normal air movement and WOB Heart:  Normal rate and regular rhythm. S1 and S2 normal without gallop, murmur, click, rub or other extra sounds. Abdomen:  Abdomen soft and nontender, nondistended. No organomegaly. No masses  Rectal:  No external abnormalities noted. Normal sphincter tone. No rectal masses or tenderness.  guaiac negative Prostate:  Prostate gland firm and smooth, no enlargement, nodularity, tenderness, mass, asymmetry or induration. 20-30 gm  Pulses:  2+ radials  Extremities:  no edema  Neurologic:  CN grossly intact, station and gait intact  Diabetes Management Exam:    Foot Exam (with socks and/or shoes not present):       Sensory-Pinprick/Light touch:          Left medial foot (L-4): normal          Left dorsal foot (L-5): normal          Left lateral foot (S-1): normal          Right medial foot (L-4): normal          Right dorsal foot (L-5): normal          Right lateral foot (S-1): normal       Sensory-Monofilament:          Left foot: normal           Right foot: normal       Inspection:          Left foot: normal          Right foot: normal       Nails:          Left foot: thickened          Right foot: thickened   Impression & Recommendations:  Problem # 1:  HEALTH MAINTENANCE EXAM (ICD-V70.0) Reviewed preventive care protocols, scheduled due services, and updated immunizations.  UTD immunizations.  prostate check today.  colonosopy UTD.  Problem # 2:  SPECIAL SCREENING MALIGNANT NEOPLASM OF PROSTATE (ICD-V76.44) PSA sent today.  DRE reassuring.  Orders: TLB-PSA (Prostate Specific Antigen) (84153-PSA)  Problem # 3:  SCREENING, COLON CANCER (ICD-V76.51) UTD colonoscopy, due in 9 years Orders: Hemoccult Guaiac-1 spec.(in office) (82270)  Problem # 4:  HYPERTENSION, BENIGN SYSTEMIC (ICD-401.1) good control.  His updated medication list for this problem includes:    Enalapril-hydrochlorothiazide 10-25 Mg Tabs (Enalapril-hydrochlorothiazide) .[name]... Take 2 tablet by mouth once a day    Toprol Xl 100 Mg Tb24 (Metoprolol succinate) .[name]... Take 1 1/2 tablet by mouth once a day    Norvasc 5 Mg Tabs (Amlodipine besylate) .[name]... 1 tablet by mouth once a day  Orders: TLB-BMP (Basic Metabolic Panel-BMET) (80048-METABOL) TLB-Hepatic/Liver Function Pnl (80076-HEPATIC)  BP today: 120/78 Prior BP: 114/76 (10/17/2010)  Labs Reviewed: K+: 4.1 (12/09/2009) Creat: : 0.98 (12/09/2009)   Chol: 103 (12/09/2009)   HDL: 32 (12/09/2009)   LDL: 50 (12/09/2009)   TG: 105 (12/09/2009)  Problem # 5:  HYPERCHOLESTEROLEMIA (ICD-272.0) good control. recheck today.  check CMP. His updated medication list for this problem includes:    Crestor 10 Mg Tabs (Rosuvastatin calcium) .[name]... 1 by mouth in the evening  Orders: TLB-Lipid Panel (80061-LIPID) TLB-Hepatic/Liver Function Pnl (80076-HEPATIC) TLB-TSH (Thyroid Stimulating Hormone) (84443-TSH)  Labs Reviewed: SGOT: 15 (12/09/2009)   SGPT: 15 (12/09/2009)   HDL:32 (12/09/2009), 29 (09/01/2007)   LDL:50 (12/09/2009), 36 (09/01/2007)  Chol:103 (12/09/2009), 104 (09/01/2007)  Trig:105 (12/09/2009), 196 (09/01/2007)  Problem # 6:  DIABETES MELLITUS II, UNCOMPLICATED (ICD-250.00) check A1c today.  good control last check.  rtc 3-6 mo for f/u. His updated medication list for this problem includes:    Aspirin 81 Mg Tbec (Aspirin) ..... One daily    Enalapril-hydrochlorothiazide 10-25 Mg Tabs (Enalapril-hydrochlorothiazide) .[name]... Take 2 tablet by mouth once a day    Glucotrol 10 Mg Tabs (Glipizide) .[name]... Take 1 tablet by mouth twice a day    Metformin Hcl 750 Mg Tb24 (Metformin hcl) .[name]... 1 by mouth qday  Orders: TLB-A1C / Hgb A1C (Glycohemoglobin) (83036-A1C)  Labs Reviewed: Creat: 0.98 (12/09/2009)    Reviewed HgBA1c results: 6.7 (03/13/2010)  6.8 (12/09/2009)  Problem # 7:  TOBACCO DEPENDENCE (ICD-305.1) Encouraged smoking cessation   His updated medication list for this problem includes:    Cvs Nicotine Polacrilex 2 Mg Gum (Nicotine polacrilex)    Nicotrol 10 Mg Inha (Nicotine) ..... 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 One daily 2)  Enalapril-hydrochlorothiazide 10-25 Mg Tabs (Enalapril-hydrochlorothiazide) .... Take 2 tablet by mouth once a day 3)  Toprol Xl 100 Mg Tb24 (Metoprolol succinate) .... Take 1 1/2 tablet by mouth once a day 4)  Norvasc 5 Mg Tabs (Amlodipine besylate) .[name].. 1 tablet by mouth once a day 5)  Crestor 10 Mg Tabs (Rosuvastatin calcium) .[name].. 1 by mouth in the evening 6)  Glucotrol 10 Mg Tabs (Glipizide) .... Take 1 tablet by mouth twice a day 7)  Metformin Hcl 750 Mg Tb24 (Metformin hcl) .[name].. 1 by mouth qday 8)  Ms Contin 30 Mg Tb12 (Morphine sulfate) .... Take 1 tablet by mouth once a day 9)  Gabapentin 300 Mg Tabs (Gabapentin)  .... Take 1 tablet by mouth three times a day 10)  Lidoderm 5 % Ptch (Lidocaine) .... Use as directed 11)  Ambien 5 Mg Tabs (Zolpidem tartrate) .... As needed 12)  Prilosec Otc 20 Mg Tbec (Omeprazole magnesium) .... One tab by mouth two times a day 13)  Cvs Nicotine Polacrilex 2 Mg Gum (Nicotine polacrilex) 14)  Hydrocodone-acetaminophen 7.5-325 Mg Tabs (Hydrocodone-acetaminophen) .[name].. 1 tablet every 4 hours 15)  Nicotrol 10 Mg Inha (Nicotine) .... Use 6-8 cartridges per day, 60-80 puffs per cartridge  Patient Instructions: 1)  Blood work today including prostate level. 2)  Please return in 3-6 months for follow up on diabetes and blood pressure and smoking. 3)  continue cutting back on smoking - good job up to now.  fill nicotrol. 4)  Call clinic with questions.  Good to see you today.   Orders Added: 1)  TLB-Lipid Panel [80061-LIPID] 2)  TLB-BMP (Basic Metabolic Panel-BMET) [80048-METABOL] 3)  TLB-Hepatic/Liver Function Pnl [80076-HEPATIC] 4)  TLB-TSH (Thyroid Stimulating Hormone) [84443-TSH] 5)  TLB-PSA (Prostate Specific Antigen) [84153-PSA] 6)  TLB-CBC Platelet - w/Differential [85025-CBCD] 7)  TLB-A1C / Hgb A1C (Glycohemoglobin) [83036-A1C] 8)  Est. Patient 40-64 years [99396] 9)  Hemoccult Guaiac-1 spec.(in office) [82270]    Current Allergies (reviewed today): No known allergies    Prevention & Chronic Care Immunizations   Influenza vaccine: Fluvax 3+  (09/21/2010)   Influenza vaccine due: 08/25/2011    Tetanus booster: 09/21/2010: Tdap   Tetanus booster due: 09/21/2020    Pneumococcal vaccine: Done.  (11/23/2006)   Pneumococcal vaccine due: 11/24/2011    H. zoster vaccine: Not documented  Colorectal Screening   Hemoccult: .  (10/19/2008)   Hemoccult due: Not Indicated    Colonoscopy: diverticulosis, f/u in 10 yrs  (02/22/2007)   Colonoscopy due: 02/21/2017  Other Screening   PSA: 0.57  (03/12/2007)   PSA ordered.   PSA action/deferral: Discussed-PSA  requested  (11/07/2010)   PSA due due: 03/11/2008   Smoking status: current  (10/17/2010)   Smoking cessation counseling: yes  (09/21/2010)  Diabetes Mellitus   HgbA1C: 6.7  (03/13/2010)   HgbA1C action/deferral: Ordered  (03/13/2010)  Hemoglobin A1C due: 06/13/2010    Eye exam: Not documented   Diabetic eye exam action/deferral: Ophthalmology referral  (03/13/2010)    Foot exam: yes  (11/07/2010)   Foot exam action/deferral: Do today   High risk foot: Not documented   Foot care education: Not documented   Foot exam due: 06/13/2010    Urine microalbumin/creatinine ratio: Not documented  Lipids   Total Cholesterol: 103  (12/09/2009)   Lipid panel action/deferral: Lipid Panel ordered   LDL: 50  (12/09/2009)   LDL Direct: 56  (03/16/2008)   HDL: 32  (12/09/2009)   Triglycerides: 105  (12/09/2009)   Lipid panel due: 06/09/2010    SGOT (AST): 15  (12/09/2009)   SGPT (ALT): 15  (12/09/2009)   Alkaline phosphatase: 76  (12/09/2009)   Total bilirubin: 0.3  (12/09/2009)   Liver panel due: 06/09/2010  Hypertension   Last Blood Pressure: 120 / 78  (11/07/2010)   Serum creatinine: 0.98  (12/09/2009)   BMP action: Ordered   Serum potassium 4.1  (12/09/2009)   Basic metabolic panel due: 06/09/2010  Self-Management Support :   Personal Goals (by the next clinic visit) :     Personal A1C goal: 7  (12/09/2009)     Personal blood pressure goal: 130/80  (12/09/2009)     Personal LDL goal: 70  (12/09/2009)    Diabetes self-management support: Not documented    Diabetes self-management support not done because: Good outcomes  (12/09/2009)    Hypertension self-management support: BP self-monitoring log, Written self-care plan, Education handout, Pre-printed educational material  (12/09/2009)    Hypertension self-management support not done because: Good outcomes  (03/13/2010)    Lipid self-management support: Not documented     Lipid self-management support not done because:  Good outcomes  (03/13/2010)

## 2011-01-23 NOTE — Assessment & Plan Note (Signed)
Summary: Pulmonary Consultation   Copy to:  Dr. Eustaquio Boyden Primary Provider/Referring Provider:  Eustaquio Boyden  MD  CC:  Pulmonary Consult for lung nodule.  History of Present Illness: Pulmonary Consultation       This is a 65 year old male who presents with an abnormal radiology finding.  The patient denies history of diagnosed COPD, shortness of breath, chest tightness, chest pain worse with breathing and coughing, wheezing, cough, mucous production, nocturnal awakening, exercise induced symptoms, and congestion.  The abnormality is described as multiple nodules.  Nodules are 5-10 mm.  The mass is described as right lower lobe.    Pt referred for abn CXR .  Pt has nodules in RLL.   Nodules have been present for 20yrs.  One is slightly larger.  There are no resp symptoms.  No cough or chest pain. Pt is active smoker  Referred for eval of nodules    Preventive Screening-Counseling & Management  Alcohol-Tobacco     Smoking Status: current     Packs/Day: 0.5  Current Medications (verified): 1)  Aspirin 81 Mg Tbec (Aspirin) .... One Daily 2)  Enalapril-Hydrochlorothiazide 10-25 Mg Tabs (Enalapril-Hydrochlorothiazide) .... Take 2 Tablet By Mouth Once A Day 3)  Toprol Xl 100 Mg Tb24 (Metoprolol Succinate) .... Take 1 1/2 Tablet By Mouth Once A Day 4)  Norvasc 5 Mg Tabs (Amlodipine Besylate) .[name].. 1 Tablet By Mouth Once A Day 5)  Crestor 10 Mg Tabs (Rosuvastatin Calcium) .[name].. 1 By Mouth in The Evening 6)  Glucotrol 10 Mg Tabs (Glipizide) .... Take 1 Tablet By Mouth Twice A Day 7)  Metformin Hcl 750 Mg  Tb24 (Metformin Hcl) .[name].. 1 By Mouth Qday 8)  Ms Contin 30 Mg Tb12 (Morphine Sulfate) .... Take 1 Tablet By Mouth Once A Day 9)  Gabapentin 300 Mg Tabs (Gabapentin) .... Take 1 Tablet By Mouth Three Times A Day 10)  Lidoderm 5 % Ptch (Lidocaine) .... Use As Directed 11)  Ambien 5 Mg Tabs (Zolpidem Tartrate) .... As Needed 12)  Prilosec Otc 20 Mg Tbec (Omeprazole Magnesium) ....  One Tab By Mouth Two Times A Day 13)  Cvs Nicotine Polacrilex 2 Mg Gum (Nicotine Polacrilex) 14)  Hydrocodone-Acetaminophen 7.5-325 Mg Tabs (Hydrocodone-Acetaminophen) .[name].. 1 Tablet Every 4 Hours  Allergies (verified): No Known Drug Allergies   Past History:  Past medical, surgical, family and social histories (including risk factors) reviewed, and no changes noted (except as noted below).  Past Medical History: Reviewed history from 10/08/2010 and no changes required. Solitary pulmonary nodule (enlarged 2011) HTN HLD T2 DM GERD, PUD  Tobacco abuse TIA s/p R CEA (2009) [place] chronic low back pain hospitalization 03/2008 - GOO 2/2 chronic PUD s/p GJostomy/vagotomy, complicated by R wrist septic arthritis, delirium  Other MDs: Cards- dr. gamble GI- dr. Bosie Clos [facility]) surgery- dr. gross narcotics handled by pain center ortho- dr. rendell/murphy vasc- Dr. Hart Rochester  Past Surgical History: Reviewed history from 10/08/2010 and no changes required. Nose surgery 1982 Neck surgery 1984 Knee surgery 1997 Right carotid endarterectomy with Dacron patch angioplasty, w/ resection of redundant carotid artery with primary reanastomosis (05/09) EGD - deformed pylorus, gastric ulcer (2007) UGI - marked GER peptic infl changes, no ulcer or gastric outlet obstruction (2009) Laparoscopic gastrojejunostomy and high selective anterior and posterior vagotomies (03/2008) Back surgery, cspine surgery Knee surgery Colonoscopy diverticulosis (Dr. Bosie Clos) 2008, rpt 2018  Family History: Reviewed history from 09/21/2010 and no changes required. 2 sisters bipolar, father CABG MI, arthritis, mother alzheimers, arthritis  Social History: Reviewed history from 10/08/2010 and no changes required. Current smoker.  1/2 ppd x 20 yrs. No alcohol or drug abuse. 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  The patient denies shortness of breath with activity, shortness of breath at rest, productive cough, non-productive cough, coughing up blood, chest pain, irregular heartbeats, indigestion, loss of appetite, weight change, abdominal pain, difficulty swallowing, sore throat, tooth/dental problems, headaches, nasal congestion/difficulty breathing through nose, sneezing, itching, ear ache, anxiety, depression, hand/feet swelling, joint stiffness or pain, rash, change in color of mucus, and fever.        See HPI for Pulmonary, Cardiac, General, and ENT review of systems.  Vital Signs:  Patient profile:   65 year old male Height:      64 inches Weight:      154.50 pounds BMI:     26.62 O2 Sat:      97 % on Room air Temp:     98.1 degrees F oral Pulse rate:   62 / minute BP sitting:   114 / 76  (left arm) Cuff size:   regular  Vitals Entered By: Gweneth Dimitri RN (October 17, 2010 9:43 AM)  O2 Flow:  Room air CC: Pulmonary Consult for lung nodule Comments Medications reviewed with patient Medications reviewed with patient Gweneth Dimitri RN  October 17, 2010 9:43 AM    Physical Exam  Additional Exam:  Gen: Pleasant, well-nourished, in no distress,  normal affect ENT: No lesions,  mouth clear,  oropharynx clear, no postnasal drip Neck: No JVD, no TMG, no carotid bruits Lungs: No use of accessory muscles, no dullness to percussion, distant BS Cardiovascular: RRR, heart sounds normal, no murmur or gallops, no peripheral edema Abdomen: soft and NT, no HSM,  BS normal Musculoskeletal: No deformities, no cyanosis or clubbing Neuro: alert, non focal Skin: Warm, no lesions or rashes    CT of Chest  Procedure date:  10/05/2010  Findings:      Findings: The right lower lobe pulmonary nodule is slightly larger today measuring 6 mm on image 32.  A slightly more superior nodule is not significantly changed at 5 mm on  image 25, in retrospect.   Linear atelectasis or scarring in the right middle lobe and lingula has developed.   Cholelithiasis.   No obvious abnormal adenopathy.  Coronary artery calcifications.   IMPRESSION: Right lower lobe pulmonary nodule is now 6 mm.  It is larger than on the prior study.  Slow-growing neoplasm is not excluded. Continued follow up is warranted.   Cholelithiasis.   Additional right lower lobe pulmonary nodule is stable.    Impression & Recommendations:  Problem # 1:  PULMONARY NODULE, SOLITARY (ICD-518.89) Assessment Unchanged Three small RLL nodules, one has slightly increased in size in middle aged smoker plan follow with serial chest CT in 3 months stop smoking , follow smoking cessation program>>>Nicotrol inhaler  Orders: New Patient Level V (69485) Radiology Referral (Radiology)  Medications Added to Medication List This Visit: 1)  Ms Contin 30 Mg Tb12 (Morphine sulfate) .... Take 1 tablet by mouth once a day 2)  Ambien 5 Mg Tabs (Zolpidem tartrate) .... As needed 3)  Hydrocodone-acetaminophen 7.5-325 Mg Tabs (Hydrocodone-acetaminophen) .[name].. 1 tablet every 4 hours 4)  Nicotrol 10 Mg Inha (Nicotine) .... Use 6-8 cartridges per  day, 60-80 puffs per cartridge  Complete Medication List: 1)  Aspirin 81 Mg Tbec (Aspirin) .... One daily 2)  Enalapril-hydrochlorothiazide 10-25 Mg Tabs (Enalapril-hydrochlorothiazide) .... Take 2 tablet by mouth once a day 3)  Toprol Xl 100 Mg Tb24 (Metoprolol succinate) .... Take 1 1/2 tablet by mouth once a day 4)  Norvasc 5 Mg Tabs (Amlodipine besylate) .[name].. 1 tablet by mouth once a day 5)  Crestor 10 Mg Tabs (Rosuvastatin calcium) .[name].. 1 by mouth in the evening 6)  Glucotrol 10 Mg Tabs (Glipizide) .... Take 1 tablet by mouth twice a day 7)  Metformin Hcl 750 Mg Tb24 (Metformin hcl) .[name].. 1 by mouth qday 8)  Ms Contin 30 Mg Tb12 (Morphine sulfate) .... Take 1 tablet by mouth once a day 9)  Gabapentin 300 Mg Tabs  (Gabapentin) .... Take 1 tablet by mouth three times a day 10)  Lidoderm 5 % Ptch (Lidocaine) .... Use as directed 11)  Ambien 5 Mg Tabs (Zolpidem tartrate) .... As needed 12)  Prilosec Otc 20 Mg Tbec (Omeprazole magnesium) .... One tab by mouth two times a day 13)  Cvs Nicotine Polacrilex 2 Mg Gum (Nicotine polacrilex) 14)  Hydrocodone-acetaminophen 7.5-325 Mg Tabs (Hydrocodone-acetaminophen) .[name].. 1 tablet every 4 hours 15)  Nicotrol 10 Mg Inha (Nicotine) .... Use 6-8 cartridges per day, 60-80 puffs per cartridge  Other Orders: Tobacco use cessation intensive >10 minutes (40347)  Patient Instructions: 1)  Focus on smoking cessation 2)  A repeat CT Chest will be obtained in 3 months 3)  Return 3 months Prescriptions: NICOTROL 10 MG INHA (NICOTINE) use 6-8 cartridges per day, 60-80 puffs per cartridge  #1 month x 2   Entered and Authorized by:   Storm Frisk MD   Signed by:   Storm Frisk MD on 10/17/2010   Method used:   Print then Give to Patient   RxID:   [id number]

## 2011-01-23 NOTE — Assessment & Plan Note (Signed)
Summary: new medicare pt to estabh from [facility]   Vital Signs:  Patient profile:   65 year old male Height:      66.5 inches Weight:      149.25 pounds Temp:     98.7 degrees F oral Pulse rate:   64 / minute Pulse rhythm:   regular BP sitting:   140 / 80  (left arm) Cuff size:   regular  Vitals Entered By: [name] Dance CMA Duncan Dull) (September 21, 2010 2:07 PM) CC: New patient to establish care   History of Present Illness: CC. new patient  Bobby Henry presents today to establish care with me.  Husband of [employer].  1. DM - due for check.  doing well.  A1c 6.7%.  no lows.  fasting seem to be <120, after meals <200 per pt recollection.  2. HTN - today 140/80.  yesterday 111/60.  thinks stress increasing today.  no HA, vision changes, chest pain, tightness, urinary changes, LE swelling.    3. HLD - Crestor 10mg  daily, started for several years.  Stopped niaspan last visit because unable to afford. wonders what chol will be like next check.  thinks would restart if necessary.   no myalgias  4. smoking - <1 ppd.  Doesn't like chantix - makes loopy.  hasn't tried wellbutrin.  Thinks needs to quit cold [place].  using gum intermittently.  5. pulm nodule - rec rpt CT x 1 more time due 03/2010.  (4mm RLL)  6. h/o TIA - s/p CEA R side.  checked last week and told normal with normal carotid US.  no ASA.  7. R shoulder pain - 3 months, threw apple and that started it.  Worse with sleeping on it.  Also with R knee, supposed to get replaced, wants to see Dr. Eulah Pont (previous Ortho).  Daughter and family leaving home at end of Oct to [place] (daughter, son in Social worker and 2 grandchildren).  Still has son, Doreene Adas, and mother in law living at home and wife.    preventative - had EGD/colonoscopy, unsure when latest (records say 2008, rpt 2018).  PSA last checked 2008.  Always been normal.  due for tetanus and flu.  pneumovax 11/2011   Preventive Screening-Counseling & Management  Alcohol-Tobacco  Smoking Cessation Counseling: yes  Current Medications (verified): 1)  Enalapril-Hydrochlorothiazide 10-25 Mg Tabs (Enalapril-Hydrochlorothiazide) .... Take 2 Tablet By Mouth Once A Day 2)  Toprol Xl 100 Mg Tb24 (Metoprolol Succinate) .... Take 1 1/2 Tablet By Mouth Once A Day 3)  Norvasc 5 Mg Tabs (Amlodipine Besylate) .[name].. 1 Tablet By Mouth Once A Day 4)  Crestor 10 Mg Tabs (Rosuvastatin Calcium) .[name].. 1 By Mouth in The Evening 5)  Glucotrol 10 Mg Tabs (Glipizide) .... Take 1 Tablet By Mouth Twice A Day 6)  Metformin Hcl 750 Mg  Tb24 (Metformin Hcl) .[name].. 1 By Mouth Qday 7)  Ms Contin 30 Mg Tb12 (Morphine Sulfate) .... Take 1 Tablet By Mouth Twice A Day 8)  Gabapentin 300 Mg Tabs (Gabapentin) .... Take 1 Tablet By Mouth Three Times A Day 9)  Lidoderm 5 % Ptch (Lidocaine) .... Use As Directed 10)  Ambien 5 Mg Tabs (Zolpidem Tartrate) 11)  Prilosec Otc 20 Mg Tbec (Omeprazole Magnesium) .... One Tab By Mouth Two Times A Day 12)  Cvs Nicotine Polacrilex 2 Mg Gum (Nicotine Polacrilex) 13)  Futuro Male Rib Statistician (Elastic Bandages & Supports) .... Sig: Use Rib Belt As Directed 14)  Aspirin 81 Mg Tbec (  Aspirin) .... One Daily  Allergies (verified): No Known Drug Allergies  Past History:  Past Medical History: Solitary pulmonary nodule HTN HLD T2 DM GERD Tobacco abuse TIA s/p R CEA (2009) Hart Rochester  Other MDs: Cards- dr. gamble GI- dr. Bosie Clos surgery- dr. gross narcotics handled by pain center ortho- dr. rendell/murphy vasc- Dr. Hart Rochester  Past Surgical History:  Right carotid endarterectomy with Dacron patch angioplasty, w/ resection of redundant carotid artery with primary reanastomosis (05/09) Laparoscopic gastrojejunostomy and Laparoscopic high selective anterior and posterior vagotomies. 5/08 Back surgery, cspine surgery Knee surgery Colonoscopy diverticulosis (Dr. Bosie Clos) 2008, rpt 2018  Family History: 2 sisters bipolar, father CABG MI, arthritis, mother alzheimers,  arthritis  Social History: 1 ppd x 13+ years No alcohol or drug abuse. 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  Review of Systems  The patient denies anorexia, fever, weight loss, weight gain, vision loss, decreased hearing, hoarseness, chest pain, syncope, dyspnea on exertion, peripheral edema, prolonged cough, headaches, hemoptysis, abdominal pain, melena, hematochezia, severe indigestion/heartburn, hematuria, incontinence, muscle weakness, suspicious skin lesions, transient blindness, difficulty walking, depression, and testicular masses.    Physical Exam  General:  Well-developed,well-nourished,in no acute distress; alert,appropriate and cooperative throughout examination Head:  L parietal scalp with fluid filled cyst, no change Eyes:  PERRL, EOMI Ears:  External ear exam shows no significant lesions or deformities.  Otoscopic examination reveals clear canals, tympanic membranes are intact bilaterally without bulging, retraction, inflammation or discharge. Hearing is grossly normal bilaterally. Nose:  External nasal examination shows no deformity or inflammation. Nasal mucosa are pink and moist without lesions or exudates. Mouth:  Oral mucosa and oropharynx without lesions or exudates.  Teeth in good repair. Neck:  No deformities, masses, or tenderness noted.  no bruits Lungs:  coarse breath sounds, normal air movement and WOB Heart:  Normal rate and regular rhythm. S1 and S2 normal without gallop, murmur, click, rub or other extra sounds. Abdomen:  Abdomen soft and nontender, nondistended. No organomegaly. No masses  Pulses:  2+ radials  Extremities:  no edema  Neurologic:  CN grossly intact, station and gait intact, sensation/motor intact Skin:  Intact without suspicious lesions or rashes Psych:  full affect   Impression & Recommendations:  Problem # 1:  HYPERTENSION, BENIGN SYSTEMIC  (ICD-401.1) due for blood work next visit (CPE). per patient, good control at home.  today slightly elevated artifact from meeting new doctor His updated medication list for this problem includes:    Enalapril-hydrochlorothiazide 10-25 Mg Tabs (Enalapril-hydrochlorothiazide) .[name]... Take 2 tablet by mouth once a day    Toprol Xl 100 Mg Tb24 (Metoprolol succinate) .[name]... Take 1 1/2 tablet by mouth once a day    Norvasc 5 Mg Tabs (Amlodipine besylate) .[name]... 1 tablet by mouth once a day  BP today: 140/80 Prior BP: 118/65 (03/13/2010)  Labs Reviewed: K+: 4.1 (12/09/2009) Creat: : 0.98 (12/09/2009)   Chol: 103 (12/09/2009)   HDL: 32 (12/09/2009)   LDL: 50 (12/09/2009)   TG: 105 (12/09/2009)  Problem # 2:  HYPERCHOLESTEROLEMIA (ICD-272.0) off niaspan 2/2 cost.  would consider restarting if needed.  if LDL <60, consider dropping crestor to 5 mg daily.  check FLP next visit.  no myalgias The following medications were removed from the medication list:    Niaspan 750 Mg Tbcr (Niacin (antihyperlipidemic)) .[name]... 1 by mouth qday His updated medication list for this problem includes:    Crestor 10 Mg Tabs (  Rosuvastatin calcium) .[name]... 1 by mouth in the evening  Labs Reviewed: SGOT: 15 (12/09/2009)   SGPT: 15 (12/09/2009)   HDL:32 (12/09/2009), 29 (09/01/2007)  LDL:50 (12/09/2009), 36 (09/01/2007)  Chol:103 (12/09/2009), 104 (09/01/2007)  Trig:105 (12/09/2009), 196 (09/01/2007)  Problem # 3:  DIABETES MELLITUS II, UNCOMPLICATED (ICD-250.00) good control as evidenced by last A1c.  continue meds.  rec f/u next visit for blood work (A1c, etc) for CPE. His updated medication list for this problem includes:    Aspirin 81 Mg Tbec (Aspirin) ..... One daily    Enalapril-hydrochlorothiazide 10-25 Mg Tabs (Enalapril-hydrochlorothiazide) .[name]... Take 2 tablet by mouth once a day    Glucotrol 10 Mg Tabs (Glipizide) .[name]... Take 1 tablet by mouth twice a day    Metformin Hcl 750 Mg Tb24 (Metformin hcl) .[name]... 1 by mouth  qday  Labs Reviewed: Creat: 0.98 (12/09/2009)    Reviewed HgBA1c results: 6.7 (03/13/2010)  6.8 (12/09/2009)  Problem # 4:  TOBACCO DEPENDENCE (ICD-305.1)  His updated medication list for this problem includes:    Cvs Nicotine Polacrilex 2 Mg Gum (Nicotine polacrilex)  Encouraged smoking cessation and discussed different methods for smoking cessation.  consider wellbutrin in future.  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  last carotid US stable.  followed by Dr. Hart Rochester. His updated medication list for this problem includes:    Aspirin 81 Mg Tbec (Aspirin) ..... One daily  Problem # 6:  PULMONARY NODULE, SOLITARY (ICD-518.89) repeat CT scan to monitor nodule.  if WNL, may stop checking (stable for 2 years.) Orders: Radiology Referral (Radiology)  Problem # 7:  GASTROESOPHAGEAL REFLUX, NO ESOPHAGITIS (ICD-530.81)  continue current treatment. His updated medication list for this problem includes:    Prilosec Otc 20 Mg Tbec (Omeprazole magnesium) ..... One tab by mouth two times a day  Complete Medication List: 1)  Aspirin 81 Mg Tbec (Aspirin) .... One daily 2)  Enalapril-hydrochlorothiazide 10-25 Mg Tabs (Enalapril-hydrochlorothiazide) .... Take 2 tablet by mouth once a day 3)  Toprol Xl 100 Mg Tb24 (Metoprolol succinate) .... Take 1 1/2 tablet by mouth once a day 4)  Norvasc 5 Mg Tabs (Amlodipine besylate) .[name].. 1 tablet by mouth once a day 5)  Crestor 10 Mg Tabs (Rosuvastatin calcium) .[name].. 1 by mouth in the evening 6)  Glucotrol 10 Mg Tabs (Glipizide) .... Take 1 tablet by mouth twice a day 7)  Metformin Hcl 750 Mg Tb24 (Metformin hcl) .[name].. 1 by mouth qday 8)  Ms Contin 30 Mg Tb12 (Morphine sulfate) .... Take 1 tablet by mouth twice a day 9)  Gabapentin 300 Mg Tabs (Gabapentin) .... Take 1 tablet by mouth three times a day 10)  Lidoderm 5 % Ptch (Lidocaine) .... Use as  directed 11)  Ambien 5 Mg Tabs (Zolpidem tartrate) 12)  Prilosec Otc 20 Mg Tbec (Omeprazole magnesium) .... One tab by mouth two times a day 13)  Cvs Nicotine Polacrilex 2 Mg Gum (Nicotine polacrilex) 14)  Futuro Male Rib Belt Misc (Elastic bandages & supports) .... Sig: use rib belt as directed  Other Orders: Tdap => 49yrs IM (10626) Admin 1st Vaccine (94854) Flu Vaccine 44yrs + (62703) Admin of Any Addtl Vaccine (50093)  Patient Instructions: 1)  Please return in next few months for CPE, come in fasting for that visit or come in a few days prior fasting. 2)  Flu shot today.  tetanus shot today.  pneumonia shot  next year. 3)  You need to quit smoking.   4)  Fasting sugars should be between 80 and 120.  after eating sugars should be 100-200.   5)  Good to see you, call clinic with questions.  Current Allergies (reviewed today): No known allergies    Prevention & Chronic Care Immunizations   Influenza vaccine: Fluvax 3+  (09/21/2010)   Influenza vaccine due: 08/25/2011    Tetanus booster: 09/21/2010: Tdap   Tetanus booster due: 09/21/2020    Pneumococcal vaccine: Done.  (11/23/2006)   Pneumococcal vaccine due: 11/24/2011    H. zoster vaccine: Not documented  Colorectal Screening   Hemoccult: .  (10/19/2008)   Hemoccult due: Not Indicated    Colonoscopy: diverticulosis, f/u in 10 yrs  (02/22/2007)   Colonoscopy due: 02/21/2017  Other Screening   PSA: 0.57  (03/12/2007)   PSA action/deferral: Discussed-decision deferred  (09/21/2010)   PSA due due: 03/11/2008   Smoking status: current  (03/13/2010)   Smoking cessation counseling: yes  (09/21/2010)  Diabetes Mellitus   HgbA1C: 6.7  (03/13/2010)   HgbA1C action/deferral: Ordered  (03/13/2010)   Hemoglobin A1C due: 06/13/2010    Eye exam: Not documented   Diabetic eye exam action/deferral: Ophthalmology referral  (03/13/2010)    Foot exam: yes  (03/13/2010)   Foot exam action/deferral: Do today   High risk foot:  Not documented   Foot care education: Not documented   Foot exam due: 06/13/2010    Urine microalbumin/creatinine ratio: Not documented    Diabetes flowsheet reviewed?: Yes   Progress toward A1C goal: At goal  Lipids   Total Cholesterol: 103  (12/09/2009)   Lipid panel action/deferral: Lipid Panel ordered   LDL: 50  (12/09/2009)   LDL Direct: 56  (03/16/2008)   HDL: 32  (12/09/2009)   Triglycerides: 105  (12/09/2009)   Lipid panel due: 06/09/2010    SGOT (AST): 15  (12/09/2009)   SGPT (ALT): 15  (12/09/2009)   Alkaline phosphatase: 76  (12/09/2009)   Total bilirubin: 0.3  (12/09/2009)   Liver panel due: 06/09/2010    Lipid flowsheet reviewed?: Yes   Progress toward LDL goal: At goal  Hypertension   Last Blood Pressure: 140 / 80  (09/21/2010)   Serum creatinine: 0.98  (12/09/2009)   BMP action: Ordered   Serum potassium 4.1  (12/09/2009)   Basic metabolic panel due: 06/09/2010    Hypertension flowsheet reviewed?: Yes   Progress toward BP goal: Deteriorated  Self-Management Support :   Personal Goals (by the next clinic visit) :     Personal A1C goal: 7  (12/09/2009)     Personal blood pressure goal: 130/80  (12/09/2009)     Personal LDL goal: 70  (12/09/2009)    Diabetes self-management support: Not documented    Diabetes self-management support not done because: Good outcomes  (12/09/2009)    Hypertension self-management support: BP self-monitoring log, Written self-care plan, Education handout, Pre-printed educational material  (12/09/2009)    Hypertension self-management support not done because: Good outcomes  (03/13/2010)    Lipid self-management support: Not documented     Lipid self-management support not done because: Good outcomes  (03/13/2010)   Immunizations Administered:  Tetanus Vaccine:    Vaccine Type: Tdap    Site: left deltoid    Mfr: GlaxoSmithKline    Dose: 0.5 ml    Route: IM    Given by: [name] Dance CMA (AAMA)    Exp. Date:  10/12/2012    Lot #: [id number]  VIS given: 11/10/08 version given September 21, 2010.  Influenza Vaccine # 1:    Vaccine Type: Fluvax 3+    Site: right deltoid    Mfr: GlaxoSmithKline    Dose: 0.5 ml    Route: IM    Given by: Janee Morn CMA (AAMA)    Exp. Date: 06/23/2011    Lot #: [id number]    VIS given: 07/18/10 version given September 21, 2010.  Flu Vaccine Consent Questions:    Do you have a history of severe allergic reactions to this vaccine? no    Any prior history of allergic reactions to egg and/or gelatin? no    Do you have a sensitivity to the preservative Thimersol? no    Do you have a past history of Guillan-Barre Syndrome? no    Do you currently have an acute febrile illness? no    Have you ever had a severe reaction to latex? no    Vaccine information given and explained to patient? yes

## 2011-01-23 NOTE — Assessment & Plan Note (Signed)
Summary: hurt ribs/[place]/[facility]   Vital Signs:  Patient profile:   65 year old male Height:      66.5 inches Weight:      158 pounds BMI:     25.21 O2 Sat:      98 % on Room air Temp:     98.5 degrees F oral Pulse rate:   61 / minute Resp:     16 per minute BP sitting:   119 / 65  (left arm) Cuff size:   regular  Vitals Entered By: Tessie Fass CMA (December 28, 2009 11:18 AM)  O2 Flow:  Room air CC: hurt ribs x 3 days Is Patient Diabetic? Yes Pain Assessment Patient in pain? yes     Location: ribs Intensity: 6   Primary Care Provider:  Bobby Rumpf  MD  CC:  hurt ribs x 3 days.  History of Present Illness: Patient of Dr Wallene Huh, here for evaluation of L chest wall pain that began when he rolled out of bed and struck a stepstool at the foot of the bed.  Happened the early morning hours of Monday, January 3rd.  Also struck L hip, has been bruised and painful.  Still able to walk, although painful.  He denies dyspnea or cough, although wife reports that he has been more winded than usual with minor activity, such as putting away Christmas decorations. Has to stop to sit down.    No fevers or chills.  Patient smokes about 1/2 pack per day of cigarettes, had tried to quit with zyban and this changed his mood. Discussed the free cessation classes at [facility], offered to him.   Habits & Providers  Alcohol-Tobacco-Diet     Tobacco Status: current     Cigarette Packs/Day: 0.75  Allergies: No Known Drug Allergies  Social History: Packs/Day:  0.75  Physical Exam  General:  Well-developed,well-nourished,in no acute distress; alert,appropriate and cooperative throughout examination Neck:  No deformities, masses, or tenderness noted. Chest Wall:  Pain to palpate L chest wall, posterior aspect around rib #9 or 10.  No step-off or appreciable displaced fracture. No ecchymosis on chest wall. No tenderness along the sternum.  Lungs:  Normal respiratory effort, chest expands  symmetrically. Lungs are clear to auscultation, no crackles or wheezes. Heart:  Normal rate and regular rhythm. S1 and S2 normal without gallop, murmur, click, rub or other extra sounds. Abdomen:  Abdomen soft and nontender, nondistended. No organomegaly.  Msk:  L hip with lateral ecchymosis visible.  Able to stand independently, flex and extend the L hip.  Some pain to externally rotate the L hip.    Impression & Recommendations:  Problem # 1:  RIB PAIN, LEFT SIDED (ICD-786.50) Assessment New Status post fall onto L side. Discussed use of rib belt to offer support.  he has lidoderm patches from his pain specialist, which he just used today for the first time and found helpful.  No NSAIDS given history of GI bleed requiring surgery.  Orders: Diagnostic X-Ray/Fluoroscopy (Diagnostic X-Ray/Flu) [facility]- Est Level  3 ([address]109)  Problem # 2:  ACCIDENTAL FALL FROM BED (ICD-E884.4) Fall was accidental.  See supportive management above.  Will order rib series and L hip films, precautionary, although my level of suspicion for any type of fracture is fairly low.  Orders: Diagnostic X-Ray/Fluoroscopy (Diagnostic X-Ray/Flu) [facility]- Est Level  3 (60454)  Complete Medication List: 1)  Crestor 10 Mg Tabs (Rosuvastatin calcium) .[name].. 1 by mouth in the evening 2)  Enalapril-hydrochlorothiazide 10-25  Mg Tabs (Enalapril-hydrochlorothiazide) .... Take 2 tablet by mouth once a day 3)  Gabapentin 300 Mg Tabs (Gabapentin) .... Take 1 tablet by mouth three times a day 4)  Glucotrol 10 Mg Tabs (Glipizide) .... Take 1 tablet by mouth twice a day 5)  Metformin Hcl 750 Mg Tb24 (Metformin hcl) .[name].. 1 by mouth qday 6)  Ms Contin 30 Mg Tb12 (Morphine sulfate) .... Take 1 tablet by mouth twice a day 7)  Nexium 40 Mg Cpdr (Esomeprazole magnesium) .... Take 1 capsule by mouth twice a day 8)  Norvasc 5 Mg Tabs (Amlodipine besylate) .[name].. 1 tablet by mouth once a day 9)  Toprol Xl 100 Mg Tb24 (Metoprolol succinate) .... Take 1 1/2  tablet by mouth once a day 10)  Niaspan 750 Mg Tbcr (Niacin (antihyperlipidemic)) .[name].. 1 by mouth qday 11)  Ambien 5 Mg Tabs (Zolpidem tartrate) 12)  Cvs Nicotine Polacrilex 2 Mg Gum (Nicotine polacrilex) 13)  Lidoderm 5 % Ptch (Lidocaine) .... Use as directed 12)  Futuro Male Rib Belt Misc (Elastic bandages & supports) .... Sig: use rib belt as directed Prescriptions: FUTURO MALE RIB BELT  MISC (ELASTIC BANDAGES & SUPPORTS) SIG: Use rib belt as directed  #1 x 0   Entered and Authorized by:   Paula Compton MD   Signed by:   Paula Compton MD on 12/28/2009   Method used:   [facility]  [address] [phone]* (retail)       [address]       [place], [place]  [address]       Ph: [phone]       Fax: [phone]   RxID:   [id number]

## 2011-01-23 NOTE — Assessment & Plan Note (Signed)
Summary: flu shot/eo  Nurse Visit   Vital Signs:  Patient profile:   65 year old male Temp:     98.5 degrees F  Vitals Entered By: Theresia Lo RN (January 11, 2010 1:48 PM)  Allergies: No Known Drug Allergies  Immunizations Administered:  Influenza Vaccine # 1:    Vaccine Type: Fluvax MCR    Site: right deltoid    Mfr: GlaxoSmithKline    Dose: 0.5 ml    Route: IM    Given by: Theresia Lo RN    Exp. Date: 06/22/2010    Lot #: [id number]    VIS given: 08/02/2009  Flu Vaccine Consent Questions:    Do you have a history of severe allergic reactions to this vaccine? no    Any prior history of allergic reactions to egg and/or gelatin? no    Do you have a sensitivity to the preservative Thimersol? no    Do you have a past history of Guillan-Barre Syndrome? no    Do you currently have an acute febrile illness? no    Have you ever had a severe reaction to latex? no    Vaccine information given and explained to patient? yes  Orders Added: 1)  Influenza Vaccine MCR [00025] 2)  Administration Flu vaccine - MCR [G0008]      Vital Signs:  Patient profile:   65 year old male Temp:     98.5 degrees F  Vitals Entered By: Theresia Lo RN (January 11, 2010 1:48 PM)

## 2011-01-25 NOTE — Assessment & Plan Note (Signed)
Summary: LUMP BEHIND EAR   Vital Signs:  Patient profile:   65 year old male Height:      64 inches Weight:      155.50 pounds BMI:     26.79 Temp:     98.6 degrees F oral Pulse rate:   84 / minute Pulse rhythm:   regular BP sitting:   110 / 58  (left arm) Cuff size:   regular  Vitals Entered By: Delilah Shan CMA  Dull) (January 08, 2011 2:16 PM) CC: Lump behind ear   History of Present Illness: Mass under L ear.  Noted this past weekend.  Not known to patient prev.  Minimally tender to palpation with pressure.  No itching.  Not known to be red.  No other skin changes.  No FCNAVD.  Smoker.  H/o cyst on scalp.  No drainage from the new lesion.  Asking for advice.   Allergies: No Known Drug Allergies  Past History:  Past Surgical History: Last updated: 10/08/2010 Nose surgery 1982 Neck surgery 1984 Knee surgery 1997 Right carotid endarterectomy with Dacron patch angioplasty, w/ resection of redundant carotid artery with primary reanastomosis (05/09) EGD - deformed pylorus, gastric ulcer (2007) UGI - marked GER peptic infl changes, no ulcer or gastric outlet obstruction (2009) Laparoscopic gastrojejunostomy and high selective anterior and posterior vagotomies (03/2008) Back surgery, cspine surgery Knee surgery Colonoscopy diverticulosis (Dr. Bosie Clos) 2008, rpt 2018  Social History: Last updated: 10/17/2010 Current smoker.  1/2 ppd x 20 yrs. No alcohol or drug abuse. 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  Past Medical History: Solitary pulmonary nodule (enlarged 2011)- follow up per Dr. Delford Field- stable on 2012 CT HTN HLD T2 DM GERD, PUD  Tobacco abuse TIA s/p R CEA (2009) [facility] chronic low back pain hospitalization 03/2008 - GOO 2/2 chronic PUD s/p GJostomy/vagotomy, complicated by R wrist septic arthritis, delirium  Other MDs: Cards- dr. gamble GI- dr. Bosie Clos  Deboraha Sprang) surgery- dr. gross narcotics handled by pain center Dr. Pamelia Hoit ortho- dr. rendell/murphy vasc- Dr. Hart Rochester  Review of Systems       See HPI.  Otherwise negative.  No "B" symptoms of weight loss, sweats, etc.   Physical Exam  General:  no apparent distress  normocephalic atraumatic  old lesion on R side of scalp note  tm wnl x2 nasal exam w/o lesions/erythema mucous membranes moist w/o ulceration/erythema neck supple w/o LA except for 1.5x1.5cm lesion inferior to L pinna.  no other posterior or supraclavicular/axillary LA noted regular rate and rhythm clear to auscultation bilaterally ext w/o edema R CEA scar noted.    Impression & Recommendations:  Problem # 1:  NECK MASS (ICD-784.2) Uclear nature of lesion:  cyst vs lipoma vs. node.  No other sig symptoms at this point.  No indication of abscess with minimal tenderness and no erythema.  I would observe for now and recheck at the end of next week.  call back sooner as needed.  He agrees.  Consider CBC/referral for bx/ext if persistent.  Complete Medication List: 1)  Aspirin 81 Mg Tbec (Aspirin) .... One daily 2)  Enalapril-hydrochlorothiazide 10-25 Mg Tabs (Enalapril-hydrochlorothiazide) .... Take 2 tablet by mouth once a day 3)  Toprol Xl 100 Mg Tb24 (Metoprolol succinate) .... Take 1 1/2 tablet by mouth once a day 4)  Norvasc 5 Mg Tabs (Amlodipine besylate) .[name].. 1 tablet by mouth once a day 5)  Crestor 10 Mg Tabs (  Rosuvastatin calcium) .[name].. 1 by mouth in the evening 6)  Glucotrol 10 Mg Tabs (Glipizide) .... Take 1 tablet by mouth twice a day 7)  Metformin Hcl 750 Mg Tb24 (Metformin hcl) .[name].. 1 by mouth qday 8)  Ms Contin 30 Mg Tb12 (Morphine sulfate) .... Take 1 tablet by mouth once a day 9)  Gabapentin 300 Mg Tabs (Gabapentin) .... Take 1 tablet by mouth three times a day 10)  Lidoderm 5 % Ptch (Lidocaine) .... Use as directed 11)  Ambien 5 Mg Tabs (Zolpidem tartrate) .... As needed 12)  Prilosec Otc 20 Mg  Tbec (Omeprazole magnesium) .... One tab by mouth two times a day 13)  Cvs Nicotine Polacrilex 2 Mg Gum (Nicotine polacrilex) 14)  Hydrocodone-acetaminophen 7.5-325 Mg Tabs (Hydrocodone-acetaminophen) .[name].. 1 tablet every 4 hours 15)  Nicotrol 10 Mg Inha (Nicotine) .... Use 6-8 cartridges per day, 60-80 puffs per cartridge 16)  Compression Stockings  .... Class ii - 20-60mm hg bilateral legs 453.9  Patient Instructions: 1)  Let us know if the area is getting bigger in the meantime.  I would get Dr. Reece Agar to check it at the end of next week.  If you have other symptoms in the meantime (fever, chills, other lumps), let us know.     Orders Added: 1)  Est. Patient Level III [[address]]    Current Allergies (reviewed today): No known allergies

## 2011-01-25 NOTE — Assessment & Plan Note (Signed)
Summary: LUMP BEHIND LEFT EAR / LFW   Vital Signs:  Patient profile:   65 year old male Weight:      154.25 pounds Temp:     98.2 degrees F oral Pulse rate:   60 / minute Pulse rhythm:   regular BP sitting:   160 / 90  (left arm) Cuff size:   regular  Vitals Entered By: [name] Dance CMA (AAMA) (January 15, 2011 9:20 AM) CC: Recheck lumb behind ear and now has left leg pain   History of Present Illness: CC: recheck swelling L neck  Mass L neck noticed last week.  No change in 1 wk.  Slightly tender to palpation.  No fevers/chills, n/v, NS, weight loss, recent viral infection or ST.  h/o R CEA.  + smoking, long history.  chantix didn't work.  down to 7 cig/day, using nicotine gum.  LLE swelling - h/o superficial thrombus noted 10/2010.  Treated with low dose NSAIDs (can't tolerate high 2/2 PUD), compression stockings (that he only uses when moving around) and elevation.  Seems to have gotten worse in last few days with more swelling, redness, pain in calf.  HTNive today - took meds this am.   Current Medications (verified): 1)  Aspirin 81 Mg Tbec (Aspirin) .... One Daily 2)  Enalapril-Hydrochlorothiazide 10-25 Mg Tabs (Enalapril-Hydrochlorothiazide) .... Take 2 Tablet By Mouth Once A Day 3)  Metoprolol Succinate 100 Mg Xr24h-Tab (Metoprolol Succinate) .[name].. 1 1/2 Pill At Bedtime For Blood Pressure 4)  Norvasc 5 Mg Tabs (Amlodipine Besylate) .[name].. 1 Tablet By Mouth Once A Day 5)  Crestor 10 Mg Tabs (Rosuvastatin Calcium) .[name].. 1 By Mouth in The Evening 6)  Glucotrol 10 Mg Tabs (Glipizide) .... Take 1 Tablet By Mouth Twice A Day 7)  Metformin Hcl 750 Mg  Tb24 (Metformin Hcl) .[name].. 1 By Mouth Qday 8)  Ms Contin 30 Mg Tb12 (Morphine Sulfate) .... Take 1 Tablet By Mouth Once A Day 9)  Gabapentin 300 Mg Tabs (Gabapentin) .... Take 1 Tablet By Mouth Three Times A Day 10)  Lidoderm 5 % Ptch (Lidocaine) .... Use As Directed 11)  Ambien 5 Mg Tabs (Zolpidem Tartrate) .... As Needed 12)  Prilosec  Otc 20 Mg Tbec (Omeprazole Magnesium) .... One Tab By Mouth Two Times A Day 13)  Cvs Nicotine Polacrilex 2 Mg Gum (Nicotine Polacrilex) 14)  Hydrocodone-Acetaminophen 7.5-325 Mg Tabs (Hydrocodone-Acetaminophen) .[name].. 1 Tablet Every 4 Hours 15)  Nicotrol 10 Mg Inha (Nicotine) .... Use 6-8 Cartridges Per Day, 60-80 Puffs Per Cartridge 16)  Compression Stockings .... Class Ii - 20-100mm Hg Bilateral Legs 453.9  Allergies (verified): 1)  * Chantix  Past History:  Past Medical History: Last updated: 01/08/2011 Solitary pulmonary nodule (enlarged 2011)- follow up per Dr. Delford Field- stable on 2012 CT HTN HLD T2 DM GERD, PUD  Tobacco abuse TIA s/p R CEA (2009) [facility] chronic low back pain hospitalization 03/2008 - GOO 2/2 chronic PUD s/p GJostomy/vagotomy, complicated by R wrist septic arthritis, delirium  Other MDs: Cards- dr. gamble GI- dr. Bosie Clos Deboraha Sprang) surgery- dr. gross narcotics handled by pain center Dr. Pamelia Hoit ortho- dr. rendell/murphy vasc- Dr. Hart Rochester  Family History: Last updated: 11/07/2010 F: CABG 6v, MI, arthritis M:Alz, arthritis 2 sisters bipolar  No CA, DM, CVA  Social History: Last updated: 10/17/2010 Current smoker.  1/2 ppd x 20 yrs. No alcohol or drug abuse. 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  Review of Systems       per HPI  Physical Exam  General:  Well-developed,well-nourished,in no acute distress; alert,appropriate and cooperative throughout examination Eyes:  PERRL, EOMI Ears:  TMs clear bilaterally Nose:  nares clear Mouth:  Oral mucosa and oropharynx without lesions or exudates.  Teeth in good repair. Neck:  neck supple w/o LA except for 1.5x1.5cm lesion lateral superior L neck Lungs:  coarse breath sounds, normal air movement and WOB Heart:  Normal rate and regular rhythm. S1 and S2 normal without gallop, murmur, click, rub or other extra  sounds. Pulses:  2+ DP/PT bilaterally Extremities:  no significant pitting edema bilaterally, left leg with mild erythema and swelling compared to right, + tender to palpation at calf but no palpable cords Skin:  Intact without suspicious lesions or rashes   Impression & Recommendations:  Problem # 1:  NECK MASS (ICD-784.2) in longtime smoker.  feels like lymphadenopathy but no recent viral infection or evident HEENT infections currently.  concerning for malignancy.  refer to ENT for eval, pt prefers to wait until next week as will have check come in then.  check WBC.  if elevated, consider trial of abx.  if normal, refer to ENT for dx.  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  h/o LLE superficial thrombus at greater saphenous vein.  recehck doppler to r/o DVT/extension of thrombus.  continue supportive care (NSAID low dose, compression stockings daily, and rest/elevation).  notify us if worsening or red flags.  Orders: Radiology Referral (Radiology)  Problem # 3:  HYPERTENSION, BENIGN SYSTEMIC (ICD-401.1) elevated today.  last week at goal.  advised to monitor over next few weeks, if not improving, to call us for readjustment of meds (increase norvasc).  changed metoprolol to generic per patient request. His updated medication list for this problem includes:    Enalapril-hydrochlorothiazide 10-25 Mg Tabs (Enalapril-hydrochlorothiazide) .[name]... Take 2 tablet by mouth once a day    Metoprolol Succinate 100 Mg Xr24h-tab (Metoprolol succinate) .[name]... 1 1/2 pill at bedtime for blood pressure    Norvasc 5 Mg Tabs (Amlodipine besylate) .[name]... 1 tablet by mouth once a day  BP today: 160/90 Prior BP: 110/58 (01/08/2011)  Labs Reviewed: K+: 4.4 (11/07/2010) Creat: : 1.1 (11/07/2010)   Chol: 114 (11/07/2010)   HDL: 25.90 (11/07/2010)   LDL: 58 (11/07/2010)   TG: 150.0 (11/07/2010)  Problem # 4:  TOBACCO  DEPENDENCE (ICD-305.1) encouraged cessation.  pt intolerant of chantix in past (personality changes) His updated medication list for this problem includes:    Cvs Nicotine Polacrilex 2 Mg Gum (Nicotine polacrilex)    Nicotrol 10 Mg Inha (Nicotine) ..... Use 6-8 cartridges per day, 60-80 puffs per cartridge  Complete Medication List: 1)  Aspirin 81 Mg Tbec (Aspirin) .... One daily 2)  Enalapril-hydrochlorothiazide 10-25 Mg Tabs (Enalapril-hydrochlorothiazide) .... Take 2 tablet by mouth once a day 3)  Metoprolol Succinate 100 Mg Xr24h-tab (Metoprolol succinate) .[name].. 1 1/2 pill at bedtime for blood pressure 4)  Norvasc 5 Mg Tabs (Amlodipine besylate) .[name].. 1 tablet by mouth once a day 5)  Crestor 10 Mg Tabs (Rosuvastatin calcium) .[name].. 1 by mouth in the evening 6)  Glucotrol 10 Mg Tabs (Glipizide) .... Take 1 tablet by mouth twice a day 7)  Metformin Hcl 750 Mg Tb24 (Metformin hcl) .[name].. 1 by mouth qday 8)  Ms Contin 30 Mg Tb12 (Morphine sulfate) .... Take 1 tablet by mouth once a day 9)  Gabapentin  300 Mg Tabs (Gabapentin) .... Take 1 tablet by mouth three times a day 10)  Lidoderm 5 % Ptch (Lidocaine) .... Use as directed 11)  Ambien 5 Mg Tabs (Zolpidem tartrate) .... As needed 12)  Prilosec Otc 20 Mg Tbec (Omeprazole magnesium) .... One tab by mouth two times a day 13)  Cvs Nicotine Polacrilex 2 Mg Gum (Nicotine polacrilex) 14)  Hydrocodone-acetaminophen 7.5-325 Mg Tabs (Hydrocodone-acetaminophen) .[name].. 1 tablet every 4 hours 15)  Nicotrol 10 Mg Inha (Nicotine) .... Use 6-8 cartridges per day, 60-80 puffs per cartridge 16)  Compression Stockings  .... Class ii - 20-9mm hg bilateral legs 453.9  Patient Instructions: 1)  Referral to ENT for evaluation of neck swelling. 2)  Referral for repeat ultrasound of L leg. 3)  Continue advil and compression stockings daily. 4)  Good to see you today, call clinic with questions. 5)  We will call you with results. Prescriptions: METOPROLOL SUCCINATE  100 MG XR24H-TAB (METOPROLOL SUCCINATE) 1 1/2 pill at bedtime for blood pressure  #45 x 5   Entered and Authorized by:   Eustaquio Boyden  MD   Signed by:   Eustaquio Boyden  MD on 01/15/2011   Method used:   [facility]  [address] [phone]* (retail)       [address]       [place], [place]  [address]       Ph: [phone]       Fax: [phone]   RxID:   [id number]    Orders Added: 1)  TLB-CBC Platelet - w/Differential [85025-CBCD] 2)  Radiology Referral [Radiology] 3)  Est. Patient Level IV [44034]    Current Allergies (reviewed today): * CHANTIX  Appended Document: LUMP BEHIND LEFT EAR / LFW please notify doppler ultrasound preliminary read negative for deep venous thrombus.  continue alleve daily as well as compresion stockings daily.  return if worsening or any chest pain or shortness of breath.  awaiting CBC for ENT eval next week.  Appended Document: LUMP BEHIND LEFT EAR / LFW Patient notified and given instructions.

## 2011-01-25 NOTE — Miscellaneous (Signed)
Summary: Orders Update  Clinical Lists Changes  Orders: Added new Test order of Venous Duplex Lower Extremity (Venous Duplex Lower) - Signed 

## 2011-01-31 ENCOUNTER — Ambulatory Visit: Payer: MEDICARE | Admitting: Physical Medicine and Rehabilitation

## 2011-01-31 ENCOUNTER — Other Ambulatory Visit: Payer: Self-pay | Admitting: Otolaryngology

## 2011-01-31 ENCOUNTER — Encounter: Payer: MEDICARE | Attending: Physical Medicine and Rehabilitation

## 2011-01-31 ENCOUNTER — Other Ambulatory Visit (HOSPITAL_COMMUNITY)
Admission: RE | Admit: 2011-01-31 | Discharge: 2011-01-31 | Disposition: A | Discharge status: Home / Self Care | Payer: MEDICARE | Source: Ambulatory Visit | Attending: Otolaryngology | Admitting: Otolaryngology

## 2011-01-31 DIAGNOSIS — I10 Essential (primary) hypertension: Secondary | ICD-10-CM | POA: Insufficient documentation

## 2011-01-31 DIAGNOSIS — M79609 Pain in unspecified limb: Secondary | ICD-10-CM | POA: Insufficient documentation

## 2011-01-31 DIAGNOSIS — M25569 Pain in unspecified knee: Secondary | ICD-10-CM | POA: Insufficient documentation

## 2011-01-31 DIAGNOSIS — M545 Low back pain, unspecified: Secondary | ICD-10-CM | POA: Insufficient documentation

## 2011-01-31 DIAGNOSIS — R220 Localized swelling, mass and lump, head: Secondary | ICD-10-CM | POA: Insufficient documentation

## 2011-01-31 DIAGNOSIS — M1710 Unilateral primary osteoarthritis, unspecified knee: Secondary | ICD-10-CM

## 2011-01-31 DIAGNOSIS — G894 Chronic pain syndrome: Secondary | ICD-10-CM

## 2011-01-31 DIAGNOSIS — E119 Type 2 diabetes mellitus without complications: Secondary | ICD-10-CM | POA: Insufficient documentation

## 2011-02-02 ENCOUNTER — Other Ambulatory Visit: Payer: Self-pay | Admitting: Family Medicine

## 2011-02-04 ENCOUNTER — Other Ambulatory Visit: Payer: Self-pay | Admitting: Family Medicine

## 2011-02-04 NOTE — Telephone Encounter (Signed)
Refill request

## 2011-02-08 ENCOUNTER — Other Ambulatory Visit: Payer: Self-pay | Admitting: Otolaryngology

## 2011-02-08 DIAGNOSIS — R221 Localized swelling, mass and lump, neck: Secondary | ICD-10-CM

## 2011-02-12 ENCOUNTER — Telehealth: Payer: Self-pay | Admitting: Family Medicine

## 2011-02-12 ENCOUNTER — Ambulatory Visit
Admission: RE | Admit: 2011-02-12 | Discharge: 2011-02-12 | Disposition: A | Discharge status: Home / Self Care | Payer: MEDICARE | Source: Ambulatory Visit | Attending: Otolaryngology | Admitting: Otolaryngology

## 2011-02-12 DIAGNOSIS — R221 Localized swelling, mass and lump, neck: Secondary | ICD-10-CM

## 2011-02-12 MED ORDER — IOHEXOL 300 MG/ML  SOLN
75.0000 mL | Freq: Once | INTRAMUSCULAR | Status: AC | PRN
  Administered 2011-02-12: 75 mL via INTRAVENOUS

## 2011-02-13 ENCOUNTER — Other Ambulatory Visit: Payer: Self-pay | Admitting: Otolaryngology

## 2011-02-20 NOTE — Progress Notes (Signed)
Summary: need to change metoprolol  Phone Note From Pharmacy   [facility] #[phone] Summary of Call: Pharmacist is asking if pt's script for metoprolol succinate can be changed to tartrate.  Tartrate is covered by pt's insurance, whereas  succinate is not.  Please advise.                     Lowella Petties CMA, AAMA  February 12, 2011 10:37 AM   Follow-up for Phone Call        ok to change if ok with patient to take 1 1/2 pills twice daily. Follow-up by: Eustaquio Boyden  MD,  February 12, 2011 1:41 PM  Additional Follow-up for Phone Call Additional follow up Details #1::        Patient is agreeable to the change.  Additional Follow-up by: Janee Morn CMA Duncan Dull),  February 12, 2011 4:05 PM    New/Updated Medications: METOPROLOL TARTRATE 100 MG TABS (METOPROLOL TARTRATE) 1 1/2 tablet twice daily for blood pressure Prescriptions: METOPROLOL TARTRATE 100 MG TABS (METOPROLOL TARTRATE) 1 1/2 tablet twice daily for blood pressure  #270 x 1   Entered and Authorized by:   Eustaquio Boyden  MD   Signed by:   Janee Morn CMA (AAMA) on 02/12/2011   Method used:   [facility]        [facility]  [address] [id number]       [address]       [place], [place]  [address]       Ph: [phone]       Fax: [phone]   RxID:   [id number]   Appended Document: need to change metoprolol please call pharmacy and cancel previous script.  goal is equivalent to 150mg  Toprol XL daily. Eustaquio Boyden  MD  February 12, 2011 4:18 PM   Called pharmacy to make them aware of correct doseage. [name] Dance CMA Duncan Dull)  February 12, 2011 4:24 PM   Clinical Lists Changes  Medications: Changed medication from METOPROLOL TARTRATE 100 MG TABS (METOPROLOL TARTRATE) 1 1/2 tablet twice daily for blood pressure to METOPROLOL TARTRATE 50 MG TABS (METOPROLOL TARTRATE) take one and a half pills twice daily for blood pressure - Signed Rx of METOPROLOL TARTRATE 50  MG TABS (METOPROLOL TARTRATE) take one and a half pills twice daily for blood pressure;  #270 x 3;  Signed;  Entered by: Eustaquio Boyden  MD;  Authorized by: Eustaquio Boyden  MD;  Method used: [id number] [address] [place], [place], [place]  [address], Ph: [phone], Fax: [phone]    Prescriptions: METOPROLOL TARTRATE 50 MG TABS (METOPROLOL TARTRATE) take one and a half pills twice daily for blood pressure  #270 x 3   Entered and Authorized by:   Eustaquio Boyden  MD   Signed by:   Eustaquio Boyden  MD on 02/12/2011   Method used:   [facility]        [facility]  [address] [phone]* (retail)       [address]       [place], [place]  [address]       Ph: [phone]       Fax: [phone]   RxID:   [id number]

## 2011-02-27 ENCOUNTER — Encounter: Payer: MEDICARE | Attending: Physical Medicine and Rehabilitation

## 2011-02-27 ENCOUNTER — Ambulatory Visit: Payer: MEDICARE

## 2011-02-27 DIAGNOSIS — F17200 Nicotine dependence, unspecified, uncomplicated: Secondary | ICD-10-CM | POA: Insufficient documentation

## 2011-02-27 DIAGNOSIS — R220 Localized swelling, mass and lump, head: Secondary | ICD-10-CM | POA: Insufficient documentation

## 2011-02-27 DIAGNOSIS — G8929 Other chronic pain: Secondary | ICD-10-CM | POA: Insufficient documentation

## 2011-02-27 DIAGNOSIS — M1710 Unilateral primary osteoarthritis, unspecified knee: Secondary | ICD-10-CM | POA: Insufficient documentation

## 2011-02-27 DIAGNOSIS — M961 Postlaminectomy syndrome, not elsewhere classified: Secondary | ICD-10-CM

## 2011-02-27 DIAGNOSIS — E119 Type 2 diabetes mellitus without complications: Secondary | ICD-10-CM | POA: Insufficient documentation

## 2011-02-27 DIAGNOSIS — I10 Essential (primary) hypertension: Secondary | ICD-10-CM | POA: Insufficient documentation

## 2011-02-27 DIAGNOSIS — M79609 Pain in unspecified limb: Secondary | ICD-10-CM

## 2011-02-27 DIAGNOSIS — M47817 Spondylosis without myelopathy or radiculopathy, lumbosacral region: Secondary | ICD-10-CM | POA: Insufficient documentation

## 2011-02-27 DIAGNOSIS — M542 Cervicalgia: Secondary | ICD-10-CM

## 2011-02-27 DIAGNOSIS — M545 Low back pain, unspecified: Secondary | ICD-10-CM | POA: Insufficient documentation

## 2011-02-27 DIAGNOSIS — I872 Venous insufficiency (chronic) (peripheral): Secondary | ICD-10-CM | POA: Insufficient documentation

## 2011-02-27 DIAGNOSIS — M2420 Disorder of ligament, unspecified site: Secondary | ICD-10-CM | POA: Insufficient documentation

## 2011-02-27 DIAGNOSIS — K219 Gastro-esophageal reflux disease without esophagitis: Secondary | ICD-10-CM | POA: Insufficient documentation

## 2011-02-27 DIAGNOSIS — Z79899 Other long term (current) drug therapy: Secondary | ICD-10-CM | POA: Insufficient documentation

## 2011-03-06 ENCOUNTER — Other Ambulatory Visit: Payer: Self-pay | Admitting: Otolaryngology

## 2011-03-07 ENCOUNTER — Ambulatory Visit: Payer: Self-pay | Admitting: Family Medicine

## 2011-03-07 DIAGNOSIS — Z0289 Encounter for other administrative examinations: Secondary | ICD-10-CM

## 2011-03-27 ENCOUNTER — Ambulatory Visit: Payer: MEDICARE

## 2011-03-27 DIAGNOSIS — M545 Low back pain, unspecified: Secondary | ICD-10-CM

## 2011-03-27 DIAGNOSIS — M79609 Pain in unspecified limb: Secondary | ICD-10-CM

## 2011-03-27 DIAGNOSIS — Z79899 Other long term (current) drug therapy: Secondary | ICD-10-CM | POA: Insufficient documentation

## 2011-03-27 DIAGNOSIS — M47817 Spondylosis without myelopathy or radiculopathy, lumbosacral region: Secondary | ICD-10-CM | POA: Insufficient documentation

## 2011-03-27 DIAGNOSIS — K219 Gastro-esophageal reflux disease without esophagitis: Secondary | ICD-10-CM | POA: Insufficient documentation

## 2011-03-27 DIAGNOSIS — E119 Type 2 diabetes mellitus without complications: Secondary | ICD-10-CM | POA: Insufficient documentation

## 2011-03-27 DIAGNOSIS — M25569 Pain in unspecified knee: Secondary | ICD-10-CM | POA: Insufficient documentation

## 2011-03-27 DIAGNOSIS — I10 Essential (primary) hypertension: Secondary | ICD-10-CM | POA: Insufficient documentation

## 2011-03-27 DIAGNOSIS — M1710 Unilateral primary osteoarthritis, unspecified knee: Secondary | ICD-10-CM | POA: Insufficient documentation

## 2011-03-27 DIAGNOSIS — M961 Postlaminectomy syndrome, not elsewhere classified: Secondary | ICD-10-CM

## 2011-03-27 DIAGNOSIS — M542 Cervicalgia: Secondary | ICD-10-CM

## 2011-04-10 ENCOUNTER — Encounter: Payer: MEDICARE | Attending: Physical Medicine and Rehabilitation

## 2011-05-08 NOTE — H&P (Signed)
HISTORY AND PHYSICAL EXAMINATION   July 26, 2008   Re:  OZAN, MACLAY               DOB:  December 11, 1946   CHIEF COMPLAINT:  Severe carotid occlusive disease with dizziness and  recent possible left brain TIA.   HISTORY OF PRESENT ILLNESS:  This is a 65 year old male patient who 5  days ago developed severe dizziness and possible slurred speech but no  hemiparesis or other specific neurologic symptoms.  He was admitted to  [facility] and had no evidence of acute stroke on CT scan or MRI  scan.  There is some mild atrophy and chronic microvascular ischemia on  MRI.  He had an ultrasound of his carotids revealed the left internal  carotid to be totally occluded with a 60-79% right internal carotid  stenosis and he was referred for further evaluation.  He has no previous  history of stroke or specific TIAs, and since this episode he has had  dizziness which has slightly improved on a daily basis.   PAST MEDICAL HISTORY:  1. Non-insulin diabetes mellitus.  2. Hypertension.  3. Hyperlipidemia.  4. Degenerative joint disease.  Severe lumbar spine disease.  5. Negative for coronary artery disease, COPD or stroke.   PAST SURGERIES:  1. Lumbar laminectomy on three occasions, most recently by Dr. Danielle Dess      in 2006.  2. Exploratory laparotomy with rerouting of his intestines by Dr.      Michaell Cowing in April of 2009 for reflux esophagitis.   FAMILY HISTORY:  Positive for coronary artery disease in his father.  Negative for diabetes, stroke.   SOCIAL HISTORY:  Married, has 4 children and is retired.  He smokes less  than a pack of cigarettes per day done and has so for about 20 years.  Does not use alcohol on a regular basis.   REVIEW OF SYSTEMS:  Negative for chest pain, dyspnea on exertion, PND,  orthopnea, weight loss.  No pulmonary or active GI symptoms with no  current reflux esophagitis.  Denies GU or vascular symptoms.  Does have  some headaches and dizziness  as noted.   ALLERGIES:  None known.   MEDICATIONS:  1. Gabapentin 300 mg 1 q.i.d.  2. Plavix 75 mg 1 daily.  3. Glipizide 10 mg b.i.d.  4. Crestor 10 mg daily.  5. Enalapril/hydrochlorothiazide 10/25 two daily.  6. Amlodipine 5 mg 1 daily.  7. Lisinopril/hydrochlorothiazide 10/12.5 mg 2 daily.  8. Toprol XL 100 mg 1.5 tablets daily.  9. Nexium 40 mg 1 b.i.d.  10.Metformin XL 750 mg daily.  11.MS-Contin 30 mg b.i.d. p.r.n. back pain.  12.Hydrocodone p.r.n. pain.   PHYSICAL EXAMINATION:  Blood pressure 100/64, heart rate 70,  respirations 14.  General:  He is a middle-aged male in no apparent  distress, alert and oriented x3.  Neck is supple with 3+ carotid pulses  palpable.  There is soft bruit on the right.  Neurologic:  Exam reveals  no focal abnormalities.  No palpable adenopathy in the neck.  Chest:  Clear to auscultation.  Cardiovascular:  Regular rhythm without murmurs.  Abdomen: Soft, nontender with no masses.  Has 3+ femoral, popliteal and  2+ posterior tibial pulses palpable bilaterally.   IMPRESSION:  1. Left internal carotid occlusion and moderate right internal carotid      stenosis with recent left brain TIA and ongoing dizziness.  2. Diabetes, non-insulin dependent.  3. Hypertension.  4. Hyperlipidemia.  5.  Severe degenerative joint disease in the lumbar spine.   PLAN:  To admit the patient on August 5 for an elective right carotid  endarterectomy.  Risks and benefits have been thoroughly discussed with  the patient and his wife.  They would like to proceed.   Quita Skye Hart Rochester, M.D.  Electronically Signed   JDL/MEDQ  D:  07/26/2008  T:  07/27/2008  Job:  [id number]

## 2011-05-08 NOTE — Op Note (Signed)
NAME:  Bobby Henry, Bobby Henry               ACCOUNT NO.:  [id number]   MEDICAL RECORD NO.:  [id number]          PATIENT TYPE:  INP   LOCATION:  1439                         FACILITY:  [facility]   PHYSICIAN:  John L. Rendall, M.D.  DATE OF BIRTH:  [birth year]   DATE OF PROCEDURE:  04/25/2008  DATE OF DISCHARGE:                               OPERATIVE REPORT   PREOPERATIVE DIAGNOSIS:  Cellulitis and infection, dorsal right wrist.   SURGICAL PROCEDURES:  I&D, right wrist, at proximal and distal carpal  rows.   POSTOPERATIVE DIAGNOSIS:  Pyarthrosis, wrist, extending out to the  extensor tendons.   SURGEON:  John L. Rendall, M.D.   ASSISTANT:  Legrand Pitts. Duffy, P.A.-C.   ANESTHESIA:  General.   PROCEDURE:  Under general anesthesia, the right hand and arm are  prepared with DuraPrep and draped as a sterile field.  The arm is  elevated, and the tourniquet is used at 250 mm.   A previous vertical incision that extends to the carpal row was  reopened.  This apparently was a laceration 30 years ago.  Dissection  down through the extensor retinaculum to the distal carpal row and under  the extensor tendons resulted in some release of pus, and the distal  carpal row was opened with the capsular incision about a centimeter in  length.  Some pus did come forth after wiggling the wrist, but it did  not immediately pour out.  This area was then copiously irrigated, but  on repeated wiggle maneuvers, the more pus came out of the wrist.  Further looking about the extensor tendons took [place] to the proximal  carpal row where the tissue was literally bulging in the capsule.  When  this was opened, 2-3 mL immediately poured forth of white, thick pus.  Further wiggling of the wrist brought even more.  The specimen was sent  for gram stain, C&S, cell count differential, and crystal exam.  At this  point, further irrigation was done, and with further wiggling, further  purulence came forth.  Finally, after irrigation  of 4 liters and further  wiggling, there was no further purulence.  At this point, after 4 liters  of irrigation were done, he had 2 Penrose drains inserted down to the  wrist, and the L-shaped skin flap was pulled back together with two  nylon sutures.  A sterile compression dressing was applied.   The patient returned to recovery in good condition.      John L. Rendall, M.D.  Electronically Signed     JLR/MEDQ  D:  04/25/2008  T:  04/25/2008  Job:  [id number]

## 2011-05-08 NOTE — Assessment & Plan Note (Signed)
[name] is a 65 year old married gentleman who followed in our [facility] for multiple chronic pain complaints including  right knee pain and low back pain.  He is status post lumbar diskectomy  by Dr. Danielle Dess in November 2005.  He also has a history of recent stomach  surgery.  He has bilateral knee osteoarthritis worse on the right than  on the left.  He is also status post carotid endarterectomy.   He is back in today and his chief complaint is right knee pain.  He  states it has been swelling intermittently.  He has some instability  now.   His average pain is between 4 and 5 on a scale of 10.  The pain is worse  during the day especially around noon.  The pain is worse with  activities, improves with rest.  He gets a well-to-fair relief with  current meds.  The pain is described as sharp, stabbing, and constant in  the low back, intermittent in the right knee.   FUNCTIONAL STATUS:  He is able to walk about 5 minutes at a time.  He  does use a cane.  He does drive.  He is independent with self- care.  He  has some numbness in the left thigh which is old.  No problems with any  new numbness, tingling, or weakness.  No changes in past medical,  social, or family history since last visit.  He continues to smoke half  a pack cigarettes a day.   PHYSICAL EXAMINATION:  VITAL SIGNS:  Blood pressure is 131/73, pulse 66,  respirations 18, and 98% saturated on room air.  GENERAL:  He is well-developed, well-nourished gentleman who appears his  stated age.  NEUROLOGIC:  He is oriented x3.  His speech is clear.  Affect is read as  very cooperative and pleasant.  Follows commands without difficulty.   Cranial nerves are grossly intact.  Coordination is grossly intact.  Reflexes are 2+ at patellar tendons and right Achilles tendon,  diminished at the left Achilles tendon, sensation decreased over the  lateral thigh in the left.  Motor strength however is 5/5 with hip  flexors, knee extensors, dorsiflexors, and plantar flexors.   Musculoskeletal reveals fairly well-preserved range of motion in the  neck and shoulders.  He has good forward flexion.  Today, he is able to  flex to approximately 90 degrees.  Extension is limited.  He has flatten  lumbar lordosis.   Right knee exam reveals mild effusion with some minimal right medial  joint line tenderness.  He has some laxity in the right lateral  collateral ligament compared to left medial collateral does not reveal  any laxity on exam today.  Possibly some AP instability.  The patient  guards quite a bit during this part of the exam.   Crepitus is noted with flexion and extension.   Gait is slightly antalgic.  Difficulties noted with tandem gait.  Romberg's is performed adequately.   IMPRESSION:  1. Right knee pain with effusion, history of instability, and      difficulty ambulating.  2. Lumbago, status post lumbar discectomy, Dr. Danielle Dess, November 21, 2004.  3. Status post left laparoscopy, gastrojejunostomy, and selective      anterior and posterior vagotomies, Dr. Karie Soda.  4. Nicotine addiction.  5. Status post carotid endarterectomy, August 2009.   PLAN:  Recommend MRI given complaints of instability as well as effusion  today.  We will refill following medications:  1. Ambien 5 mg 1 p.o. nightly p.r.n. insomnia.  2. MS Contin 50 mg 1 p.o. q.a.m., #30, no refills.  3. Vicodin 7.5/325 one p.o. q.i.d. p.r.n. back or knee pain, #120, no      refills.   Mr. Kozma has been taking his medications as prescribed.  No aberrant  behavior is observed.  We will await MRI.  We will see him back in a  month.           ______________________________  Brantley Stage, M.D.     DMK/MedQ  D:  10/06/2008 10:15:38  T:  10/06/2008 23:11:44  Job #:  [id number]

## 2011-05-08 NOTE — Assessment & Plan Note (Signed)
Bobby Henry is a 65 year old gentleman who has followed in our Pain and  Rehabilitative Clinic with multiple chronic pain complaints.  Chief  complaint today is low back and right knee pain.  He has severe  tricompartmental degenerative changes in the right knee.  He hopes to  follow up with the orthopedist this fall.  He continues to see Dr. Wallene Huh  at [facility] for his primary care needs.   He was last seen by me on April 20, 2009.   In the interim, he has been healthy.  His right knee does bother him  occasionally.  He does use a cane in case it gives way on him.   His average pain in the low back and legs are about 5 or 6 on a scale of  10.  He reports fair-to-good relief with current medications.  Pain is  typically worse with increased activity.  Improves with rest, medication  and TENS unit.  Pain is described as constant, sharp, burning, tingling  in nature, waxing and waning in intensity.   He can walk 10 minutes at a time.  He typically walks that far several  times a day.  He does go outside.  He is independent with self-care.   REVIEW OF SYSTEMS:  Negative for problems controlling bowel or bladder.  Denies suicidal ideation.  He admits to some difficulty walking,  otherwise essentially negative review of systems.   PAST MEDICAL, SOCIAL, AND FAMILY HISTORY:  Unchanged since last visit.  He continues to smoke less than half pack of cigarettes a day, cautioned  against this.   MEDICATIONS:  Prescribed through [facility] include;  1. Vicodin 7.5/325 up to 4 times per day.  2. Neurontin 400 mg q.i.d.  3. Lidoderm p.r.n.  4. MS Contin 1 p.o. q.a.m. 15 mg tablets.  5. Ambien p.r.n. not more than 15 tablets per month.   Exam today; blood pressure is 161/85, pulse 67, respirations 18, 99%  saturated on room air.  He is well-developed, well-nourished gentleman  who does not appear in any distress who is oriented x3.  Speech is  clear.  Affect is  bright.  He is alert, cooperative, pleasant.  Follows  commands without difficulty, answers my questions appropriately.   Cranial nerves and coordination are intact.  Reflexes are diminished in  the lower extremities.  No abnormal tone is noted.  No clonus is noted.  No tremors are appreciated.   Sensory exam in the lower extremities revealed slight deficit over the  anterolateral thigh on the left, otherwise intact.  Motor strength 5/5  at hip flexors, knee extensors, dorsiflexors, and plantar flexors.   Straight leg raise is negative.   Examination of the right knee reveals mild effusion, has some difficulty  with full extension, he has approximately 5-7 degrees flexion  contracture at the right knee.  No tenderness to palpation today in the  right knee along the joint line.   IMPRESSION:  1. Right knee severe tricompartmental osteoarthritis per recent MRI,      chronic right anterior cruciate ligament tear noted on MRI, October 11, 2008, awaiting orthopedic referral.  2. History of low back pain/lumbago, status post lumbar diskectomy,      Dr. Danielle Dess, November 21, 2004.  3. Intermittent left shoulder pain, currently not a problem at this      time.   Medical problems include gastroesophageal reflux, history of carotid  endarterectomy, hypertension,  diabetes, nicotine addiction.   PLAN:  Refill the following medications; Lidoderm 5% 1 patch 12 hours on  and 12 hours off, 47-month supply with 3 refills.  MS Contin 15 mg 1 p.o.  q.a.m. #30, no refills.  Ambien 5 mg 1 p.o. nightly p.r.n. insomnia #15,  no refills, and Neurontin 300 mg 1 p.o. q.i.d. #120 with 2 refills.  We  will see him back in 1 month for a nursing visit, for refill medications  and pill counts, and then I will see him back in 2 months.  He has been  stable on the above medications, exhibited no aberrant behavior with his  medications as prescribed and reports good relief with them.            ______________________________  Brantley Stage, M.D.     DMK/MedQ  D:  06/22/2009 09:23:45  T:  06/22/2009 23:39:54  Job #:  [id number]

## 2011-05-08 NOTE — Assessment & Plan Note (Signed)
Bobby Henry is a 65 year old married gentleman who is being followed in  our pain and rehabilitative clinic for predominantly low back pain and  left leg pain.   He is back in today for a refill of his medications, his average pain is  basically unchanged, about a 5 on a scale of 10.  He gets good relief  with the medications prescribed by our clinic.  Sleep tends to be poor.  Recently pain is described as constant, aching and tingling in nature,  worse with bending and walking and standing, improved with rest,  medication and TENS unit.   HE HAS NO KNOWN DRUG ALLERGIES.   MEDICATIONS PROVIDED BY OUR CLINIC:  1. Norco 5/325 up to 4 times a day.  2,  MS Contin 30 mg twice a day.  1. Neurontin 300 mg 4 times a day.  2. P.r.n. Lidoderm.   He is able to walk about 5 minutes at a time, he is limited in his  ability to climb stairs and he is able to drive.  He is independent with  his self care.  No changes on past medical, social and family history.  He did mention that he is having some abdominal complaints.  He ran out  of his Nexium about a week ago and has had some epigastric discomfort.  I have asked him to follow up with his primary care doctor regarding  this.   States he is not having any dark stools, blood in his stools, is not  throwing up any blood, is not lightheaded.   Past medical, social and family history otherwise unchanged.  He is  smoking about 1-2 cigarettes a day at this point which is down from less  than 1/2 a pack last month.   EXAMINATION:  Blood pressure is 152/76, pulse 79, respirations 18, 95%  saturated on room.  He is a well-developed, well-nourished gentleman who  appears his stated age.  He is oriented x3, speech is clear, his affect  is alert.  He does seem a bit tired, he is cooperative and pleasant,  follows commands without difficulty.   Transitioning from sit to stand is done without any difficulty, gait in  the room is normal.  Tandem gait,  Romberg's test are performed  adequately.  He has limitations in lumbar motion and especially in  extension.  Reflexes are symmetric at the patellar tendons and Achilles'  tendons, no abnormal tone is noted, motor strength is good in the lower  extremities.   Straight leg raise negative.   IMPRESSION:  1. Lumbago, status post lumbar diskectomy, Dr. Danielle Dess November 21, 2004.  2. Intermittent neuropathic left lower extremity pain, overall      improved with the use of Neurontin.  3. Bilateral knee pain, patient has deferred radiographs and      orthopedic referrals for several visits now.  4. Nicotine addition, overall improving as he is down to 1-2      cigarettes at this time.   PLAN:  Refill the following medications for him today:  Norco 5/325 one  p.o. q.6 hours p.r.n. back or knee pain #120 no refills; MS Contin 30 mg  one p.o. q.12 hours #60 no refills; Neurontin 300 mg one p.o. q.i.d.  #120 three refills; and will trial him on trazodone 150 mg one-half  tablet, up to one tablet p.o. nightly #30 for insomnia.  Will  see him back in a month.  Regarding his abdominal  complaints, I asked  him to follow up with his primary care doctor and to fill his Nexium.  Apparently he did not have enough money to get this filled last week.           ______________________________  Bobby Henry, M.D.     DMK/MedQ  D:  12/05/2007 13:24:05  T:  12/06/2007 09:40:50  Job #:  [id number]

## 2011-05-08 NOTE — Procedures (Signed)
CAROTID DUPLEX EXAM   INDICATION:  Follow up right CEA with known left ICA occlusion.   HISTORY:  Diabetes:  Yes.  Cardiac:  No.  Hypertension:  Yes.  Smoking:  Yes.  Previous Surgery:  Right CEA, 07/28/2008.  CV History:  TIA before surgery.  Amaurosis Fugax No, Paresthesias No, Hemiparesis No.                                       RIGHT             LEFT  Brachial systolic pressure:  Brachial Doppler waveforms:  Vertebral direction of flow:        Antegrade  DUPLEX VELOCITIES (cm/sec)  CCA peak systolic                   105  ECA peak systolic                   134  ICA peak systolic                   164  ICA end diastolic                   68  PLAQUE MORPHOLOGY:  PLAQUE AMOUNT:                      None visualized  PLAQUE LOCATION:   IMPRESSION:  1. Limited right-side-only study due to known left internal carotid      artery occlusion.  2. Right internal carotid artery velocities are suggestive of 40% to      59% stenosis.  Velocities may be elevated due to contralateral      internal carotid artery occlusion.  3. No significant plaque visualized.  4. No significant changes from previous study.   ___________________________________________  Quita Skye Hart [place], M.D.   AS/MEDQ  D:  09/05/2010  T:  09/05/2010  Job:  [id number]

## 2011-05-08 NOTE — Assessment & Plan Note (Signed)
Mr. Sease is a 65 year old married gentleman who is followed in our  [facility]itative Clinic for multiple chronic pain complaints  which include low back pain, bilateral knee pain worse on the right than  on the left, and more recently some left shoulder pain.   His medical problems include gastroesophageal reflux disease.  He is  status post a left laparoscopic gastrojejunostomy and selective anterior  and posterior vagotomies as well as a carotid endarterectomy in August  2009.   He maintains contact with Dr. Cyril Mourning to monitor his carotid artery  status.   Mr. Leisinger has been doing well over the last few weeks.  I had ordered  an x-ray for him; however, he states that his car broke down and he was  unable to have the x-ray done.  He also states his mother has been sick  at [facility] with pneumonia and urinary tract infection and that she  has been quite ill.   Mr. Veney is back in today for a refill of his pain medications.  His  average pain is between 5 and 6 on a scale of 10.  Main pain is in the  left lumbar region radiating down the left posterior thigh.  Also, right  knee pain has been bothering him, but he has not had any recent falls.  Left shoulder pain has also been aggravating for him.  Pain is typically  worse with walking and using the shoulder pain.  He reports improvement  with medication and use of the TENS unit.  He has fair-to-good relief  with current meds.   Medications from this clinic include:  1. MS Contin 15 mg 1 p.o. q.a.m.  2. Vicodin 7.5/325 up to 4 times a day on a p.r.n. basis.  3. Ambien 5 mg not more than 15 tablets per month.  4. He also uses Neurontin 300 mg 4 times a day.   He denies any problems with side effects or ill effects from the above  medications at this time.  No problems with constipation or  oversedation.   FUNCTIONAL STATUS:  He walks with a cane.  He can walk about 5-10  minutes at a time.  He has difficulty with  stairs.  He does drive.  He  is independent with self-care.   Review of systems is otherwise negative.  He notes occasional night  sweats and variations in blood sugar.  I asked him to maintain contact  with primary care for these issues.   No other change in the past medical, social or family history.  Continues to smoke half a pack of cigarettes each day.   On exam, today blood pressure is 126/58, pulse 63, respiration 18, 97%  saturated on room air.   Mr. Jawad is a thin adult gentleman who appears his stated age and does  not appear in any distress.   He is oriented x3.  Speech is clear.  Affect is bright.  He is alert,  cooperative, and pleasant.  He follows commands without difficulty and  answers all questions appropriately.   His cranial nerves and coordination are intact grossly.  His reflexes  are 1+ in the upper and lower extremities diminished at the Achilles  tendons bilaterally.  No abnormal tone is noted.  No clonus is noted.  Motor strength is good in both upper and lower extremities without focal  deficits.   He has limitations in lumbar motion in all planes.  He has some  limitations  in cervical range of motion as well today.  Full shoulder  range of motion is noted on the right.  On the left, he has limitations  in internal and external occasion.  He has full abduction 108  bilaterally.  He has signs of some impingement on exam today as well.   Bicipital tendon does not appear to be tender.  Speed test and Yergason  tests do not provoke pain anteriorly.   There does not appear to be any evidence of shoulder instability on exam  as well today.   IMPRESSION:  1. Right knee with known severe tricompartmental osteoarthritis with      chronic anterior cruciate ligament tear per MRI on October 11, 2008.  He would like to follow up with Delbert Harness; however, he      has some financial constraints at this time.  2. Lumbago status post lumbar diskectomy,  Dr. Danielle Dess, November 21, 2004.  3. Left shoulder pain.  Exam consistent with possible rotator cuff      tendonitis/bursitis, would like to rule out hooked acromion on x-      ray.  He states he will comply and get x-rays before the next      visit, may consider injection.  4. Status post left laparoscopy, gastrojejunostomy, selective anterior      and posterior vagotomy, Dr. Karie Soda.  5. Nicotine addiction.  6. Status post carotid endarterectomy in August 2009.  7. Inconsistent urine drug screen noted on September 15, 2008.  The      metabolite for hydrocodone was not noted; however, he may lack the      enzymes to convert this.  We will repeat urine drug screen this      visit.  We will see him back in a month.   We will refill the following medications:  1. Ambien 5 mg 1 p.o. nightly p.r.n. insomnia, #15.  2. Vicodin 7.5/325 one p.o. q.i.d. p.r.n. back and knee pain, #120, no      refills.  3. MS Contin 50 mg 1 p.o. q.a.m., #30, no refills.   We will see him back in a month.  We would like to check shoulder  radiographs at that time, may consider injection to the left shoulder as  well.  We will discuss treatment options with Mr. Normoyle at that time.           ______________________________  Brantley Stage, M.D.     DMK/MedQ  D:  01/26/2009 13:10:18  T:  01/27/2009 03:25:18  Job #:  [id number]

## 2011-05-08 NOTE — Assessment & Plan Note (Signed)
Bobby Henry is a very pleasant 65 year old gentleman who is followed in  our [facility] for chronic pain complaints, which  include low back pain and right knee pain.  He is status post lumbar  diskectomy by Dr. Danielle Dess in November 2005.  He also has a history of  recent stomach surgery.  He has bilateral knee osteoarthritis, worse on  the right than on the left.  He is status post carotid endarterectomy.   A knee MRI was ordered and done on October 11, 2008.  The results of his  knee MRI are reviewed with him today.  He understands that he has severe  tricompartmental osteoarthritis as well as likely chronic ACL tear as  well as some other degenerative changes in that knee.   His primary care physician has also reviewed the scan with him prior to  him following up here in my clinic today.  Primary care has referred Mr.  Henry to Dr. Delbert Harness for further evaluation and treatment.   Bobby Henry states that he owe some money to the clinic, and they will  not seem until he has taken care of his bills.   Today, Bobby Henry indicates his pain is between a 5 and a 6 on a scale  of 10 and this is in the area of low back.  His right knee also bothers  him especially when he is up walking.  Pain is described in the back as  rather constant, sharp, and burning in nature.  Sleep is fair.  The pain  improves with rest and medication.  It is worse with activities.   FUNCTIONAL STATUS:  Bobby Henry is able to walk about 10 minutes at a  time.  He has difficulty with stairs.  He does drive.  He is independent  with self-care and is able to perform some high-level household  activities.   REVIEW OF SYSTEMS:  Positive for occasional night sweats.  Otherwise,  denies problems controlling bowel or bladder.  Denies depression,  anxiety, or suicidal ideations.  Denies problems with oversedation or  constipation.   No other changes in past medical, social, or family history.   Continues  to smoke about a half pack of cigarettes a day.   PHYSICAL EXAMINATION:  VITAL SIGNS:  Blood pressure is 139/75, pulse 64,  respirations 18, and 96% saturated on room air.  GENERAL:  He is a thin adult gentleman who does not appear in any  distress.  He is oriented x3.  Speech is clear.  Affect is bright.  He  is alert, cooperative, and pleasant.  Follows commands quite easily.  NEUROLOGIC:  Cranial nerves are grossly intact.  Coordination is intact.  EXTREMITIES:  Reflexes are 2+ at the patellar and Achilles tendons.  He  has decreased sensation over the left thigh.  Motor strength is 5/5  without any obvious focal deficits with respect to strength.  He does  have some limitations in knee extension.  He lacks about 10 degrees  bilaterally.   Gait is more symmetric today, mildly antalgic.  He does not need a cane  to ambulate in the room.  Tandem gait is performed with difficulty.  Romberg test, however, is performed adequately.   IMPRESSION:  1. Right knee pain with known severe tricompartmental osteoarthritis      and chronic anterior cruciate ligament tear for MRI on October 11, 2008.  Referral has been made by Primary Care to  [facility], and this is on hold in lieu of a bill that needs to be paid      at this time.  2. Lumbago status post lumbar diskectomy, Dr. Danielle Dess, November 21, 2004.  3. Status post left laparoscopy, gastrojejunoscopy, and selective      anterior and posterior vagotomies, Dr. Karie Soda.  4. Nicotine addiction.  5. Status post carotid endarterectomy, August 2009.   PLAN:  1. Follow up with Orthopedic Surgery when he can.  We refilled the      following medications for him today, May 26, 2008.  Ambien 5 mg 1      p.o. nightly p.r.n., not more than 15 tablets per month; Neurontin      300 mg 1 p.o. q.i.d., #120 with 3 refills; MS Contin 15 mg 1 p.o.      every morning, #30, no refills; and Vicodin 7.5/325 one p.o.  q.i.d.      p.r.n. back or leg pain, #120, no refills.  Follow up with nursing      next month for refill of medications.  Follow up with MD in 2      months.   Bobby Henry has been taking his medications as prescribed.  He has not  exhibited aberrant behavior and reports overall improvement in his  functional status with the use of these pain medications.           ______________________________  Brantley Stage, M.D.     DMK/MedQ  D:  11/01/2008 11:17:55  T:  11/01/2008 23:41:35  Job #:  [id number]

## 2011-05-08 NOTE — Assessment & Plan Note (Signed)
Bobby Henry is a 65 year old gentleman who is followed in our [facility] for multiple chronic pain complaints.   His chief complaint today are low back pain and right knee pain.  He is  known to have severe tricompartmental degenerative changes in the right  knee.  He has been trying to get into Orthopedic Clinic; however, he  states he has been unsuccessful.  We will try again this month.   He does have intermittent low back pain as well.  He is status post  lumbar diskectomy Dr. Danielle Dess on November 21, 2004, with intermittent  chronic low back pain.   Average pain is 7-8 on a scale of 10.  Sleep tends to be poor.  Pain is  worse with activities, improves with medication and reports good relief  with current meds.   MEDICATIONS FROM THIS CLINIC:  1. Vicodin 7.5/325 up to 4 per day.  2. Neurontin 300 mg 4 per day.  3. Lidoderm p.r.n.  4. MS Contin 15 mg one p.o. q.a.m.  5. P.r.n. Ambien as well, not more than 15 tablets per month.   FUNCTIONAL STATUS:  He is able to walk about 5 minutes at a time.  He  does use a cane occasionally when he does give way on him.  He is prone  to swelling and locking on him.  He has difficulty with steps.  He is  able to drive.  He is independent with self-care.  Denies any new  problems with respect to review of systems.  No new medical problems are  appreciated.  Continues to smoke half pack of cigarettes per day states  he had a recent long x-ray per Dr. Wallene Huh, had been a nodule in his lung  apparently unchanged, does not need another x-ray for about a year per  Mr. Blagg.  I do not have notes from primary care office.   No changes in family history.   PHYSICAL EXAMINATION:  Blood pressure is 145/68, pulse 64, respirations  18, and 99% saturated on room air, thin, adult gentleman in no apparent  distress.  Oriented x3.  Speech is clear.  Affect is bright.  He is  alert, cooperative, and pleasant.  Follows commands without  difficulty,  answers my questions appropriately.   Cranial nerves and coordination are intact.  His gait is antalgic.  Decreased weightbearing to the right lower extremity.   Reflexes are 1+ at the right knee, 0 at the left knee, 0 at the right  ankle, 1+ at the left ankle.  No abnormal tone is noted.  No clonus is  noted.  Motor strength is in 5/5 range without focal deficits.  He has  tenderness along the medial and lateral joint line of the right knee.  Crepitus noted with flexion and extension.  No effusion noted today.   He has +2 pulses at the posterior tib and dorsalis pedis.  There is no  edema in either extremity.   IMPRESSION:  1. Right knee severe tricompartmental osteoarthritis per recent MRI.      Chronic right anterior cruciate ligament tear noted on a MRI      October 11, 2008, awaiting orthopedic referral.  2. Lumbago status post lumbar diskectomy Dr. Danielle Dess, November 21, 2004.  3. Intermittent left shoulder pain, not a problem at this time.   MEDICAL PROBLEMS:  History of gastroesophageal reflux, history of  carotid endarterectomy, hypertension, diabetes, nicotine addiction.   PLAN:  Refill the following medications for him; Vicodin 7.5/325 one  p.o. q.i.d. p.r.n. leg or back pain, #120.  He does not need a refill on  his Neurontin or Ambien today.  We will refill his MS Contin 15 mg one  p.o. q.a.m. #30, no refills.  See him back in a month and continue to  monitor pill counts.  He is reporting overall good relief from his  narcotic pain medication.  He is not exhibiting any aberrant behavior.  He has been bringing his pills in for account.  He appears to be taking  his medications as prescribed.           ______________________________  Bobby Henry, M.D.     DMK/MedQ  D:  04/20/2009 12:25:46  T:  04/21/2009 01:00:24  Job #:  [id number]

## 2011-05-08 NOTE — Assessment & Plan Note (Signed)
Bobby Henry is a 65 year old married gentleman who is followed in our  [facility] for chronic low back pain and left leg  pain.  In the interim since he was seen on May 10, 2008, he has  undergone a right carotid endarterectomy.  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  Surgery was done by Dr.  Hart Rochester about 2 weeks ago.  He does have a followup appointment with Dr.  Hart Rochester in the upcoming week or so.  Bobby Henry states he is out of pain  medications.  He ran out a couple of days ago.  Average pain is about a  6 on a scale of 10.  Sleep tends to be poor.  Pain in low back and leg  is worse with walking, bending, and prolonged standing.  He gets fair  relief with current medication.   His functional status is quite good.  He is able to walk.  He is  independent with all of his self-care and light household tasks.  He is  able to walk about 5 minutes at a time.  He does not climb stairs.  He  does not drive.   Denies problems controlling bowel or bladder.  Denies any numbness,  tingling, or tremors.  Denies trouble walking, spasms, or dizziness.  Denies confusion, depression, or anxiety.  Denies suicidal ideation.   REVIEW OF SYSTEMS:  Otherwise negative except for recent weight gain.  He has been trying to gain a bit more weight after his abdominal surgery  earlier this year.   SOCIAL HISTORY:  The patient continues to live with his wife.  He smokes  1 to 2 cigarettes a day.   On exam today, his blood pressure is 168/88, pulse 78, respiration 18,  and 99% saturation on room air.  He is a thin, adult gentleman who does  not appear in any distress.  He is oriented x3.  Speech is clear.  Affect is bright.  He is alert, cooperative, and pleasant.  Brief mental  status exam reveals intact cognitive abilities.   Cranial nerves are grossly intact.  Coordination is intact.  Reflexes  are 1+ and symmetric in the upper and lower extremities  with the  exception of left Achilles tendon reflex which is 0.  Motor strength is  5/5 in the upper and lower extremities without focal deficits.  Sensory  exam is intact except for over the left anterior thigh which is not new.   He has limitations in cervical range of motion, lacking about 30 degrees  with rotation to the left.  He has mild limitations with rotation to  right.  He does have limitations with flexion and extension as well  today.  He has a healing scar over the right anterior lateral neck area.   He has full shoulder range of motion without pain.   IMPRESSION:  1. Lumbago status post lumbar diskectomy Dr. Danielle Dess, November 21, 2004.  2. Status post diagnostic left laparoscopy, gastrojejunoscopy, and      selective anterior and posterior vagotomy per Dr. Karie Soda.  3. History of incision and drainage of right wrist cellulitis, Dr.      Priscille Kluver, Apr 25, 2008.  4. Bilateral knee osteoarthritis.  5. Nicotine addiction, currently decreased smoking down to 1 to 2      cigarettes per day.  6. Status post carotid endarterectomy 2 weeks ago, currently followed  by Dr. Hart Rochester.   PLAN:  We refill Bobby Henry's pain medication today.  He has been off  his mediations for several days now, and we will discontinue his  morphine at this time.  Since he has been off for a while, we will place  him back on Vicodin 7.5/325 one p.o. q.i.d. p.r.n. back and knee pain  #120, Ambien 5 mg 1 p.o. nightly p.r.n. insomnia #20 per month.  I will  see him back in a month.  He does not need refills on Neurontin and  Lidoderm patches at this time.   He has not exhibited any aberrant behavior with the use of his pain  medications.  He takes them as prescribed, and he does not exhibit  problems with oversedation or constipation with their use.  I will see  him back in a month.           ______________________________  Bobby Henry, M.D.     DMK/MedQ  D:  08/09/2008  13:12:29  T:  08/10/2008 01:46:10  Job #:  [id number]

## 2011-05-08 NOTE — Assessment & Plan Note (Signed)
Mr. Oyama is a 65 year old married gentleman who is being followed in  our [facility] for problems with his low back.  He  is status post lumbar diskectomy, Dr. Danielle Dess, November 2005.  He also  has problems with his stomach.  He has had recent stomach surgery  earlier this spring.  He has bilateral knee osteoarthritis, status post  carotid endarterectomy.  His chief complaint in our clinic is low back  pain and bilateral knee pain.  He is requesting to be placed back on MS-  Contin at least at a smaller dose because he is having trouble getting  out of bed in the morning.   He states that he has been having to get up little bit earlier because  his grandson was recently kidnapped by the other grandparents apparently  and there has been some legal battle regarding that.   His average pain is about 5 or 6 on a scale of 10, moderately  interfering with his activities.  His pain is fairly constant, worse  with activities, increase at rest.  He gets fair relief with current  meds.   FUNCTIONAL STATUS:  He is able to walk 5-10 minutes at time.  He has  difficulties with stairs.  He is able to drive.  He is independent with  self care.   REVIEW OF SYSTEMS:  Really noncontributory.   Past medical, surgical, and family history also noncontributory.  He  continues to smoke about 1-5 cigarettes a day.   MEDICATIONS:  Medications which he uses, which are provided through this  [facility] Clinic include Ambien 5 mg up to 20 tablets a month,  Vicodin 7.5/325 four times a day, Neurontin 300 mg q.i.d., and Lidoderm  patch 5% on p.r.n. basis.   PHYSICAL EXAMINATION:  VITAL SIGNS:  Blood pressure is 137/67, pulse 69,  respiration 18, and 98% saturate on room air.  GENERAL:  He is well-developed, well-nourished gentleman who appears to  stated age without appearing in any distress.   He is oriented x3.  Speech is clear.  Affect is bright.  He is alert,  cooperative  and pleasant.  Follows commands without difficulty.   Cranial nerves are grossly intact.  Coordination is intact.  Reflexes  are 2+ at the right Achilles tendon and 0 at the left Achilles tendon.  He has good strength in bilateral lower extremities without focal  deficits.  Sensation is diminished over the left lateral thigh.   Lumbar motion is limited in all planes especially with extension.  He  has almost no extension today.   Gait reveals nonantalgic, however, he is unable to extend his knee fully  during the stance phase and heel strike.   Further examination reveals bilateral knee flexion contracture of 15  degrees in both knees.   IMPRESSION:  1. Lumbago, status post lumbar diskectomy Dr. Danielle Dess, November 21, 2004.  2. Bilateral knee osteoarthritis with bilateral knee flexion      contracture.  3. Status post left laparoscopy, gastrojejunoscopy, and selective      anterior and posterior vagotomy per Dr. Karie Soda.  4. Nicotine addiction currently down to 1-5 cigarettes per day.  5. Status post carotid endarterectomy back in August 2009.   PLAN:  Recommend physical therapy to address hamstring tightness, also  recommend orthopedic followup.  Mr. Loera has really not wanted to  following up with the orthopedics at this time.  He feels that he has  too much going on in his social life as well as recent medical problems.   He is requesting some morphine in the morning to help him get out of  bed.  We will refill the following medications for him Vicodin 7.5/325  one p.o. q.i.d. p.r.n. back or knee pain #120, no refills, Ambien 5 mg  one p.o. nightly #15, no refills.  We will start MS-Contin 15 mg one  p.o. q.a.m. #30, no refills.  We will obtain urine drug screen today as  well.  We will follow him back up in a month.           ______________________________  Brantley Stage, M.D.     DMK/MedQ  D:  09/06/2008 86:57:84  T:  09/07/2008 07:02:10  Job #:   [id number]

## 2011-05-08 NOTE — Op Note (Signed)
NAMETURON, KILMER               ACCOUNT NO.:  [id number]   MEDICAL RECORD NO.:  [id number]          PATIENT TYPE:  INP   LOCATION:  3306                         FACILITY:  [facility]   PHYSICIAN:  Quita Skye. Hart Rochester, M.D.  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   OPERATION:  1. Right carotid endarterectomy with Dacron patch angioplasty.  2. Resection of redundant carotid artery with primary reanastomosis.   SURGEON:  Quita Skye. Hart Rochester, MD   FIRST ASSISTANT:  Wilmon Arms, PA   ANESTHESIA:  General endotracheal.   BRIEF HISTORY:  This patient 1 week ago experienced aphasia and  dizziness and was evaluated and found to have a total left internal  carotid occlusion and a moderate right internal carotid stenosis, but no  evidence of an acute stroke.  He is scheduled now for a right carotid  endarterectomy.   PROCEDURE:  The patient was taken to the operating room and placed in  the supine position at which time satisfactory general endotracheal  anesthesia was administered.  The right neck was prepped with Betadine  scrub and solution and draped in routine sterile manner.  Incision was  made along the anterior border of sternocleidomastoid muscle and carried  down through subcutaneous tissue and platysma using the Bovie.  The  common facial vein and external jugular veins were ligated with 3-0 silk  ties and divided exposing the common internal and external carotid  arteries.  Care was taken not to injure the vagus or hypoglossal nerves  both of which were exposed.  There was a calcified  atherosclerotic  plaque at the carotid bifurcation extending up the internal carotid  artery about 4 cm.  The distal vessel appeared normal, but there was a  great deal of tortuosity in the proximal internal carotid artery with a  mild kink.  A #10 shunt was prepared and the patient was heparinized.  Carotid vessels were occluded with vascular clamps.  Longitudinal  opening made in the common carotid with 15 blade and extended up in the  internal carotid with Potts scissors to a point distal to the disease.  The plaque was about 70% stenotic in severity with no ulceration.  Distal vessel appeared normal.  Standard endarterectomy was then  performed using the elevator and Potts scissors with eversion and  endarterectomy of the external carotid.  The plaque feathered off the  distal internal carotid artery nicely not requiring any tacking sutures.  Lumen was thoroughly irrigated with heparin saline.  All loose debris  carefully removed and using 4 marking sutures of 6-0 Prolene, a 2-cm  segment of common carotid artery was resected with primary reanastomosis  done with 6-0 Prolene.  Clamps were then released.  A Dacron patch  was  sewn into place.  Following this, the shunt was removed.  Following  antegrade and retrograde flushing, closure was completed with  reestablishing the flow, initially up the external and up the internal  branch.  Carotid was occluded  for less than 2 minutes for removal of the shunt.  Protamine was then  given to reverse the heparin.  Following adequate hemostasis, the wound  was irrigated with saline and closed in layers with Vicryl in a  subcuticular fashion.  Sterile dressing applied.  The patient was taken  to the recovery room in satisfactory condition.      Quita Skye Hart Rochester, M.D.  Electronically Signed     JDL/MEDQ  D:  07/28/2008  T:  07/28/2008  Job:  [id number]

## 2011-05-08 NOTE — Discharge Summary (Signed)
NAMEJAZIR, NEWEY               ACCOUNT NO.:  [id number]   MEDICAL RECORD NO.:  [id number]          PATIENT TYPE:  INP   LOCATION:  1439                         FACILITY:  [facility]   PHYSICIAN:  Ardeth Sportsman, MD     DATE OF BIRTH:  Sep 19, 1946   DATE OF ADMISSION:  04/21/2008  DATE OF DISCHARGE:                               DISCHARGE SUMMARY   REFERRING PHYSICIAN:  Dr. Karie Soda.   PRIMARY CARE PHYSICIAN:  Dr. Benn Moulder.   ORTHOPEDIC SURGEON:  Dr. Priscille Kluver.   NEUROLOGIST:  Dr. Sharene Skeans.   INFECTIOUS DISEASE PHYSICIAN:  Dr. Cliffton Asters.   FINAL DISCHARGE DIAGNOSES:  1. Gastric bowel obstruction secondary to chronic ulcer disease.  2. Right wrist septic arthritis.  3. Delirium.   OTHER DIAGNOSES:  1. Chronic peptic ulcer disease.  2. Hypertension.  3. Chronic pain secondary to low back pain.  4. Diabetes mellitus.  5. Negative stress test in 2008.  6. Hypercholesterolemia.  7. Status post bilateral knee surgery.  8. Status post nose surgery in 1982.  9. Status post neck surgery in 1984.  10.Status post lower back surgery in 1990.  11.Right knee and left elbow surgery in 1985.  12.Bilateral wrist surgery in 2005.  13.Repeat lower back surgery in 2005.   PROCEDURE PERFORMED:  1. Laparoscopic Kocherization to the duodenum with a gastrojejunostomy      and highly selective vagotomy on April 22, 2008.  2. Incision and drainage of right wrist at proximal and distal carpal      pockets on Apr 24, 2008, by Dr. Priscille Kluver.  3. CT scan of the head which showed no evidence of a stroke.   SUMMARY OF HOSPITAL COURSE:  Mr. Zobrist is a 65 year old gentleman with  numerous health issues who had persistent gastric outlet obstruction  from a chronic ulcer.  Dr. Bosie Clos sent the patient to me for a  surgical evaluation.  I took him to the operating room.  The area was  too stuck to his pancreas, and bile duct is safely excised so therefore  we bypassed using a gastrojejunostomy  laparoscopically.  Because he had  chronic ulcer disease, refractory medical therapy did a highly selective  vagotomy as well anteriorly and posteriorly.  Postoperatively, he  developed right wrist pain and swelling and had septic arthritis.  An  Orthopedic consultation was made and on postop day number 3, he was sent  to the operating room and had pus evacuated.  He was started on IV  antibiotics.  His cultures grew out gram-positive rods on Gram stain but  no definite organism.  Infectious disease consultation was made by Dr.  Cliffton Asters.  He was started on unison and transitioned over to  Augmentin.   Patient's gastric graft and swallow studies showed no evidence of a leak  and a past visit of contrast into his jejunum when he was at least semi-  erect.  Drain amylases were in the normal range arguing against the leak  as well.  By the time of discharge, he was able to advance to a solid  diet and  was tolerating that relatively well.   On postoperative day 7 and 4, he became increasingly agitated and  restless. Based on concerns, we got a CAT scan of the head which was  negative and some laboratory values.  He had a mildly elevated white  count around 12 but everything else was rather normal.  Because of his  mental status changes and his difficulty with standing had physical  therapy evaluation as well as Neurological evaluation.  Dr. Sharene Skeans  felt it was most likely consistent with delirium given the fact that he  did not have any focal neurological deficits and negative head CT.  He  was monitored on the following day, he felt much better.  Dr. Sharene Skeans  felt it was reasonable for him to be discharged home so could be in his  home environment where he could better normalize and recuperate.   Therefore, if Physical Therapy feels he can be cleared with a walker, we  will set up home health physical therapy and have them evaluate.   PLAN AT DISCHARGE WITH THE FOLLOWING  INSTRUCTION:  1. He should stick to sort of a smaller more frequent fields of post      gastrectomy-type diet.  2. He should avoid any aspirin, nonsteroidals or any other ulcer      reducing medications.  3. He should call us if he has any fever, chills, sweats, nausea, or      vomiting, worsening abdominal pain or uncontrolled diarrhea or      other concerns.  4. He should have home health physical therapy for evaluation and use      a cane like he normally does or transition even to a walker if he      needs it.  Occupational Therapy is going to do activities of daily      living evaluation as well just prior to discharge.  If he has      worsening walking or difficulty, he should call myself or his      primary care physician Dr. Seleta Rhymes or followup with Dr. Roel Cluck as      needed.  5. He should followup with Dr. Priscille Kluver with Orthopedic Surgery next      week for evaluation of his wrist.  6. He should continue Augmentin 875 mg p.o. b.i.d. for a total of 4      weeks of antibiotic therapy per Dr. Blair Dolphin recommendations of      Infectious Disease.   He is to continue on the following medications at home:  1. He told us he takes amitriptyline but his wife says actually he      takes trazodone at home so he should continue the trazodone at home      25 mg p.o. at bedtime.  2. Amitriptyline.  3. Chantix 1 mg b.i.d.  4. Crestor 10 mg q.p.m.  5. Enalapril/hydrochlorothiazide 10/25 two tabs daily.  6. Gabapentin 300 mg three times a day.  7. Glucotrol 10 mg b.i.d.  8. Vicodin 5/325 one to two every 4 hours p.r.n. pain.  9. Levitra 10 mg p.r.n.  10.Lidoderm 5% patch a day.  11.Metformin 750 mg p.o. daily.  12.MS-Contin 30 mg p.o. b.i.d.  13.Nexium 40 mg p.o. b.i.d.  14.Norvasc 5 mg p.o. daily.  15.Metoprolol 150 mg XL daily.  16.Niaspan 750 mg daily.  17.Tylenol p.r.n.  18.Vitamin B complex daily.  19.Vitamin C daily.   He should take Reglan 10 mg p.o. every 6 hours p.r.n.  nausea or  Phenergan suppositories 25 mg p.r.n. nausea.      Ardeth Sportsman, MD  Electronically Signed     SCG/MEDQ  D:  04/30/2008  T:  04/30/2008  Job:  [id number]   cc:   Cliffton Asters, M.D.  Fax: [phone]   Benn Moulder, M.D.   Shirley Friar, MD  Fax: [phone]   Carlisle Beers. Rendall, M.D.  Fax: [phone]   Deanna Artis. Sharene Skeans, M.D.  Fax: [phone]   Ardeth Sportsman, MD  [address] [place] [address]

## 2011-05-08 NOTE — Assessment & Plan Note (Signed)
Bobby Henry is a 65 year old married gentleman, who is followed in our  [facility] for multiple chronic pain complaints  including left shoulder pain, right knee pain, and low back pain.   He has significant gastroesophageal reflux disease.  He is status post  left laparoscopic gastrojejunostomy, and selective anterior and  posterior vagotomies, as well as carotid endarterectomy in August 2009.   He maintains contact with Dr. Cyril Mourning to monitor his carotid artery  status.   Bobby Henry is typically complaining of right knee pain today.  He is  known to have tricompartmental arthritis and chronic anterior cruciate  ligament tear on that side.  His knee does give way on him.  He is  wearing a brace today, but is requesting referral to an orthopedic  surgeon.   His left shoulder does not bother him quite as much as it did at last  visit.  He states that he does have some limitations in range of motion.  However, he has no numbness, tingling, or weakness in the upper  extremities.  No neck pain.   His average pain is between 6 and 7 on a scale of 10, described as  sharp, stabbing, aching, and tingling.  Pain is worse with activities  and improves with medication a little bit.   Medications provided through this clinic include:  1. Vicodin 7.5/325 four times a day.  2. Neurontin 300 mg 4 times a day.  3. Lidoderm p.r.n.  4. MS Contin 15 mg in the morning.  5. Ambien 5 mg at night.   FUNCTIONAL STATUS:  Bobby Henry can walk about 5 minutes at a time.  He  has difficulty with stairs.  He does drive.  He is independent with self-  care.   REVIEW OF SYSTEMS:  Negative for problems with bowel or bladder control.  Denies numbness, tingling, tremor, or dizziness.  Denies depression,  anxiety, or suicidal ideation.   Review of systems is positive for occasional night sweats.   Past medical, social, and family history otherwise unchanged.   On exam today, blood pressure  155/68, pulse is 64, respirations 18, and  96% saturated on room air.  He is thin, adult gentleman, who does not  appear in any distress.  He is oriented x3.  Speech is clear.  Affect is  bright.  He is alert, cooperative, and pleasant.  Follows commands  without difficulty and answers questions appropriately.   Cranial nerves and coordination are grossly intact.  Reflexes are 2+ in  the patellar and Achilles tendon, and 2+ in the upper extremities.  No  abnormal tone is noted.  No clonus is noted.  Motor strength is  nonfocal.   Evaluation of the left shoulder, positive empty can test and minimal  tenderness today along the bicipital tendon.  Speed and Yergason tests  negative and limitations in full range of motion.  He is able to abduct  left shoulder about 90 degrees today.  Some limitations noted in  internal rotation and external rotation approximately 90 degrees.   Right knee is evaluated, mild effusion is appreciated.  Positive AP as  well as mediolateral laxity appreciated of 20, flexion to 125 degrees,  extension -20.  Tenderness along the medial joint line, crepitus with  flexion/extension.   IMPRESSION:  1. Right knee known severe tricompartmental osteoarthritis with      chronic right anterior cruciate ligament tear per MRI on October 11, 2008.  We  will refer him over to Dr. Lequita Halt per request.  2. Lumbago, status post lumbar diskectomy by Dr. Danielle Dess on November 21, 2004.  3. Left shoulder pain, probable rotator cuff tendinitis/bursitis.      Recent radiograph notes some acromioclavicular joint osteoarthritis      as well as mild glenohumeral osteoarthritis noted, a type 1      acromion appreciated.  Results were reviewed with Bobby Henry today      as well.   Medical problems include:  1. History of gastroesophageal reflux disease.  2. History of carotid endarterectomy.  3. Hypertension.  4. Diabetes.  5. Nicotine addiction.   Most recent urine drug  screen reviewed.   We will refill the following medications for him today:  1. Neurontin 300 mg 1 p.o. q.i.d., #120.  2. Vicodin 7.5/325 q.i.d., #120 p.r.n. for knee and shoulder pain.  3. MS Contin 15 mg 1 p.o. q.a.m., #30.  4. Ambien 5 mg 1 p.o. nightly p.r.n. insomnia, #15.   Suggested shoulder injection.  The patient would like to defer this for  now, would like to follow up with Dr. Lequita Halt as planned.  We will see  Bobby Henry back in a month.  Pill counts have been appropriate and he  takes his medication as prescribed.           ______________________________  Brantley Stage, M.D.     DMK/MedQ  D:  02/23/2009 13:39:35  T:  02/24/2009 02:59:37  Job #:  [id number]

## 2011-05-08 NOTE — Assessment & Plan Note (Signed)
Monday, January 05, 2008.   HISTORY:  Bobby Henry is a 65 year old married gentleman who is followed  in our pain and rehabilitative clinic for low back pain and left leg  pain.  He is back in today for a refill of his medications.  He states  his average pain is about a 5-6 on a scale of 10, constant, aching in  nature in the low back and in the left lateral thigh as well as in the  anterior and posterior thigh.   Sleep is fair.  Pain is worse towards the end of the day.  Worse with  walking and bending and improves with rest, TENS unit, medications.  He  gets fair relief with the current medications that he is on.  He is able  to walk between 5 and 10 minutes at a time.  He has some difficulty with  stairs.  He is able to drive.   He is independent with self care, feeding, bathing, toileting, dressing,  meal prep.   He denies depression, anxiety or suicidal ideation.   He states in the last week he was seen in the emergency room for  abdominal complaints.  He was worked up and put on some Carafate.  He  has a followup appointment with his primary care physician today.   PAST MEDICAL, SOCIAL, FAMILY HISTORY:  Otherwise unchanged.  He  continues to smoke about 5 cigarettes a day.   ALLERGIES:  The patient has no known drug allergies.   MEDICATIONS:  Medications provided by our clinic include:  1. Neurontin 300 mg one p.o. q.i.d.  2. Trazodone 50 mg, one-half to one full tablet p.o. daily nightly.  3. MS Contin 30 mg one p.o. q.12h. #60 per month.  4. Norco 5/325 one p.o. q.6h. p.r.n. back or knee pain, #120.   Bobby Henry has been stable on these medications.  He has not displayed  any aberrant behavior.  He takes his medications as directed and is able  to maintain a functional lifestyle.  We will see him back in one month.           ______________________________  Bobby Henry, M.D.     DMK/MedQ  D:  01/05/2008 09:31:10  T:  01/05/2008 10:26:25  Job #:  [id number]

## 2011-05-08 NOTE — Assessment & Plan Note (Signed)
OFFICE VISIT   FARLEY, CROOKER  DOB:  [birth year]                                       08/17/2008  ZOXWR#:[id number]   The patient underwent a right carotid endarterectomy plus resection of  redundant common carotid artery with reanastomosis on August 5.  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  He has had no neurologic symptoms since  his surgery, is swallowing well, has no hoarseness and denies any  particular problems.  He has been taking Plavix on a daily basis since  his TIAs and I have recommended that he continue to take these until his  current supply is gone and then to begin taking one aspirin per day.  He  has had no chest pain, dyspnea on exertion, PND or orthopnea.   PHYSICAL EXAM:  Vital signs:  Blood pressure is 120/70, heart rate 60,  respirations 14.  Right neck incision has healed nicely.  Carotid pulses  3+ with no bruits audible.  Neurological:  Essentially normal.  Upper  extremity pulses are 3+ bilaterally.   I think in general he is making good progress and will be beginning the  aspirin when his Plavix supply is gone.  If he develops any neurologic  symptoms he will be in touch with me otherwise I will see him in 6  months with a followup carotid duplex exam in our office.   Quita Skye Hart [place], M.D.  Electronically Signed   JDL/MEDQ  D:  08/17/2008  T:  08/18/2008  Job:  [id number]

## 2011-05-08 NOTE — Assessment & Plan Note (Signed)
OFFICE VISIT   Bobby Henry, Bobby Henry  DOB:  Mar 03, 1946                                       02/01/2009  EAVWU#:[id number]   The patient underwent a right carotid neurectomy in August of 2009  following a left brain TIA which led to the discovery that he had total  left internal carotid occlusion and a moderate right internal carotid  stenosis.  He required resection of his common carotid artery because of  redundancy and has done very well postoperatively with no neurologic  complications or symptoms.  He is swallowing well, has had no hoarseness  and currently takes aspirin on a daily basis.  He has had no chest pain,  dyspnea on exertion, PND, orthopnea and also does not have claudication  symptoms because he is unable to ambulate long distances because of his  knee problems.  He does need knee replacement at least on the right.   PHYSICAL EXAMINATION:  Vital signs:  Blood pressure 101/57, heart rate  61, respirations 14.  Right neck incision is well-healed.  Carotid pulse  is 3+ on the right with no bruit.  The left carotid pulse is 2-3+ with  no bruit.  Neurologic:  Unremarkable.  Abdomen:  Soft, nontender with no  masses.  He has 3+ femoral and popliteal pulses bilaterally.   In general I think he is doing well.  His carotid duplex exam today  reveals no flow reduction in his right internal carotid at the  endarterectomy site and chronic occlusion at his left internal carotid.  I again encouraged him to discontinue smoking.  He is currently smokes  about one half pack of cigarettes a day.  He will return in 6 months for  followup carotid duplex at that time.   Quita Skye Hart [place], M.D.  Electronically Signed   JDL/MEDQ  D:  02/01/2009  T:  02/02/2009  Job:  [id number]

## 2011-05-08 NOTE — Procedures (Signed)
CAROTID DUPLEX EXAM   INDICATION:  Follow-up evaluation of known carotid artery disease.   HISTORY:  Diabetes:  Yes.  Cardiac:  No.  Hypertension:  Yes.  Smoking:  Yes.  Previous Surgery:  Right carotid endarterectomy on 07/28/2008 with  Dacron patch angioplasty.  CV History:  Previous duplex on 07/26/2008 revealed 40-59% right ICA  stenosis and occluded left ICA.  Amaurosis Fugax No, Paresthesias No, Hemiparesis No.                                       RIGHT             LEFT  Brachial systolic pressure:         118               126  Brachial Doppler waveforms:         Triphasic         Triphasic  Vertebral direction of flow:        Antegrade         Antegrade  DUPLEX VELOCITIES (cm/sec)  CCA peak systolic                   84                91  ECA peak systolic                   142               175  ICA peak systolic                   89                Occluded  ICA end diastolic                   37                Occluded  PLAQUE MORPHOLOGY:                  Mixed             Mixed  PLAQUE AMOUNT:                      Minimal           Severe  PLAQUE LOCATION:                    Proximal ICA      ICA   IMPRESSION:  1. No right internal carotid artery stenosis, status post      endarterectomy.  2. Occluded left internal carotid artery.       ___________________________________________  Quita Skye Hart [place], M.D.   MC/MEDQ  D:  02/01/2009  T:  02/01/2009  Job:  [id number]

## 2011-05-08 NOTE — Assessment & Plan Note (Signed)
HISTORY:  [name] is a 65 year old gentleman who is being seen in our  pain and rehabilitative clinic for chronic low back pain, right knee  pain.  Status post lumbar diskectomy back in 2005 by Dr. Danielle Dess.  Last  month he had some complaints of left lower extremity weakness and has  been engaged in a physical therapy program over the last several weeks.  He reports improvement in his strength.  He states that it does bother  him to go up and down stairs on occasion yet with respect to pain.  He  feels his strength has improved in the left lower extremity  significantly and he is now working on the right lower extremity as  well.   He is back in today for refill of his medications.  He has no new  problems.   His average pain is about a 5 on a scale of 10 which is an improvement  from last month.  He states his pain in the low back is fairly constant.  He does have some tingling in the left lateral thigh and no more  problems with weakness.   His sleep is improved from poor to fair within the last month.  He  reports fair relief with the current meds that he is treated with.   Medications prescribed by this clinic include:  1. MS Contin 30 mg one orally every 12 hours, #60 as month.  2. Norco 5/325 mg up to four times a day, 120 per month.  3. Neurontin 300 mg four times a day.  4. Amitriptyline 10 mg at night.   He is able to walk between 15 and 20 minutes which is an improvement  from last month as well.  He is independent with all of his self care.  He is able to perform higher level household duties.  In fact, he does  do a good bit of the cleaning at home.   No changes in past medical, social, or family history.  He is under the  care of Dr. Bosie Clos for an active ulcer.  Apparently he may have an  endoscopy in the next few months.  He also states he may have a small  scalp lesion removed in the next several weeks.   PHYSICAL EXAMINATION:  Blood pressure 134/75, pulse  63, respirations 16,  98% saturated on room air. He is a thin adult gentleman who does not  appear in any distress.  He is oriented x3.  Speech is clear.  Affect is  bright, alert, cooperative, and pleasant.  He follows commands without  difficulty.  Transitions from sit to stand easily.  Gait is not  antalgic.  Tandem gait.  Romberg test performed adequately.  Limitations  in lumbar motion noted in all planes.  Reflexes are diminished in the  lower extremities.  No abnormal tone is noted.  No clonus is noted.  Motor strength has improved, especially in the left lower extremity was  in the 4-/5 range with hip flexion and knee extension.  Today he has  full strength again, 5/5 at left knee extension, as well as left hip  flexion.   IMPRESSION:  1. Resolution of left lower extremity weakness with exercise.  2. Status post lumbar diskectomy, Dr. Stefani Dama, November 21, 2004.  3. Intermittent neuropathic left lower extremity pain, improved with      the use of Neurontin.  4. History of cervical spine disease.  5.  Bilateral knee pain with apparent component of patellofemoral      osteoarthritis, especially on the right.  6. Nicotine addiction, currently down to 4-5 cigarettes per day.   PLAN:  We will refill his medications:  1. Norco 5/325 mg up to four times a day, #120, no refills.  2. MS Contin 30 mg one orally every 12 hours, #60, no refills.  3. He is also continuing to use Neurontin 300 mg four times a day and      amitriptyline 10 mg at bedtime.   He is able to maintain a functional lifestyle on these medications.  He  is helping out with his daughter's baby at home.  He is able to  participate in household duties as well as some yard duties outside.  He  is independent with all of his self care.   He displays no aberrant behavior in this clinic.  He takes his  medications as prescribed and is getting fair to good relief with them.  We will see him back in two  months.  Nursing visit next month.           ______________________________  Brantley Stage, M.D.     DMK/MedQ  D:  05/21/2007 10:28:30  T:  05/21/2007 10:59:07  Job #:  [id number]   cc:   Stefani Dama, M.D.  Fax: [phone]

## 2011-05-08 NOTE — Assessment & Plan Note (Signed)
Bobby Henry is a 65 year old married gentleman who is followed in our  [facility] predominantly for low back pain and  radiating right leg pain.   He is back in today for a refill of his medications.  He states his  average pain os about 4-5 on a scale of 10, moderately interfering with  his general activity and enjoyment of life.  The pain is typically worse  in the morning and toward the evening, described as dull and tingling in  nature.  Worse with walking, bending and sitting.  Improved with rest,  medication, using the TENS unit.   Gets fair relief with the medications form provided by this clinic   MEDICATIONS:  From this clinic include:  1. MS-Contin 30 mg 1 p.o. q.12 h #60 per month.  2. As well as Norco 5/325 1 p.o. q.6 h p.r.n. back pain, #120.   Mr.  Henry is about to walk about 10 minutes at a time.  He is able to  climb stairs and drive.  He is independent with his self-care.  He is  able to manage the higher level household activities as well.   No problems controlling bowel or bladder.  No problems with depression,  anxiety or suicidal ideation.  Denies any changes in his past medical  history, surgical, social or family history in the interim since he was  last seen by me on 07/24/408.  He has had in the interim 2 nursing  visits for refill of his medications.  Incidentally, he did mention that  he is under the care of a gastrointestinal doctor who may be performing  another endoscopy in the upcoming months.   PHYSICAL EXAMINATION:  VITAL SIGNS:  On exam today is blood pressure is  114/74, pulse 69, respirations 18, 98% saturation on room air.  GENERAL:  He is a think adult gentleman who appears his stated age.  He  does not appear in any distress.  He is oriented x3.  Speech is clear.  Affect is bright.  He is alert, cooperative, pleasant.  Follows commands  without difficulty.   Transitions from sitting to standing without any difficulty.   Gait is  not antalgic in the room.  Tandem gait and Roberg's test were performed  adequately.  He has limitations in lumbar motion in all planes.  Reflexes are 1+ at the patellar tendons and Achilles tendons.  No  abnormal tone is noted.  No clonus is noted.  Motor strength in hip  flexors, knee extensors, dorsiflexors, plantar flexors  are in the 5/5  range.  No focal weakness is appreciated.  No abnormal tone is normal.  No significant tenderness is noted throughout the lumbar, paraspinal  muscles or throughout th lumbar spine.   IMPRESSION:  1. Lumbago, status post lumber diskectomy by Dr. Danielle Henry on November 21, 2004.  2. Intermittent nerve pathway left lower extremity pain improved with      the use of Neurontin.  3. Bilateral knee pain.  Bobby Henry states that his knees have      improved since the last time I saw him.  He states, I'm not      worried about my knees right now.  Last visit we recommended he      follow up with Dr. Nicola Henry.  He states that he did attempt to      call Dr. Leonides Henry however he was told that his insurance, [employer],  would not be taken by Dr. Leonides Henry and he would have to pay out of      pocket, and he has not pursued any further visits with Dr. Leonides Henry      at this time.  4. Nicotine addiction.  He is currently down to 3-4 cigarettes per      day.   PLAN:  1. Refill Norco 5/325 in p.o. 6.12 h p.r.n. back pain, #120.  2  MS-Contin 30 mg 1 p.o. ql.12 h, #60.  1. Neurontin will be refilled next month.  2. He does not need amitriptyline at this point.  He uses his      medications as prescribed.  He does not display any aberrant      behavior.  3. Nursing visit next month.  4. Will see him back in 2 months.           ______________________________  Brantley Stage, M.D.     DMK/MedQ  D:  09/11/2007 12:48:19  T:  09/11/2007 14:08:40  Job #:  [id number]

## 2011-05-08 NOTE — Consult Note (Signed)
NAME:  Bobby Henry, Bobby Henry               ACCOUNT NO.:  [id number]   MEDICAL RECORD NO.:  [id number]          PATIENT TYPE:  INP   LOCATION:  1439                         FACILITY:  [facility]   PHYSICIAN:  Deanna Artis. Hickling, M.D.DATE OF BIRTH:  April 06, 1946   DATE OF CONSULTATION:  04/29/2008  DATE OF DISCHARGE:                                 CONSULTATION   CHIEF COMPLAINT:  Altered mental status.   HISTORY OF PRESENT CONDITION:  Bobby Henry is a 65 year old gentleman  admitted to [facility] for laparoscopic treatment of  intractable ulcer disease.   The patient had a gastrojejunostomy and had a vagotomy.  He tolerated  this procedure extremely well and was able to start eating within two  days postoperatively.   The patient developed inflammation of his wrist which turned out to be a  septic arthritis and is on antibiotics.  He has had increasing problems  with altered mental status and has had polymyoclonus problems with his  gait and last night was at his worse when he stumbled over a garbage  can, further spraining his wrist.   The patient has had hallucinations talking about children who lives with  who are not in the room.  This has greatly concerned his wife.   I was asked to see him to determine etiology of his delirium and make  recommendations for further workup and treatment.   The the patient has had CT scan of the brain which was normal and has  laboratories that showed no significant abnormalities.  There has not  been a great deal of change in his medications.  He was on a Dilaudid  PCA pump and is now back on his MS Contin.  He usually takes trazodone  for to sleep at nighttime, and this was changed to amitriptyline.  His  wife says that despite the fact that he had greater problems with mental  status and his gait earlier in the day, he seems better now but is not  yet normal.   PAST MEDICAL HISTORY:  Remarkable for:  1. Peptic ulcer disease with chronic  pyloric ulcer and gastric outlet      obstruction  2. Type 2 diabetes mellitus.  3. Hypertension.  4. Dyslipidemia.  5. Normocytic anemia.  6. Degenerative joint disease, degenerative disk disease, chronic low      back pain.   PAST SURGICAL HISTORY:  1. Multiple lumbar laminectomies, most recent in 2005 by Barnett Abu.  2. Bilateral carpal tunnel releases.  3. Bilateral knee surgery.    He has left elbow pain, chronic low back pain, allergic rhinitis.  He  had a remote right hand laceration which does not seem as if is related  to his current septic arthritis.   SOCIAL HISTORY:  The patient is married.  He lives with two of his sons,  a daughter, a grandchild, and his wife.  His wife has been at bedside  since he was admitted.  The patient to smokes tobacco, and I think has  not been able to smoke.  This may also be causing additional stress for  him.   Twelve-system review is negative except as noted above.   On examination today, blood pressure 138/99, resting pulse 92,  respirations 20, temperature 99.2, capillary glucose 102, oxygen  saturation 93%.  HEENT:  No infections.  LUNGS:  Clear.  HEART:  No murmurs.  Pulses normal.  ABDOMEN:  Soft.  Bowel sounds normal.  No hepatosplenomegaly.  EXTREMITIES:  Were normal.  There is a splint on his right wrist.  Mental status:  The patient is awake.  He is oriented to place and name.  He had difficulty with the date.  He had no dysphasia, dyspraxia,  dysarthria, or hallucinations.  Cranial nerves:  Round reactive pupils.  Visual fields full to double simultaneous stimuli.  Extraocular  movements full and conjugate.  Symmetric facial strength.  Midline  tongue and uvula.  Air conduction greater than bone conduction  bilaterally.   Motor examination:  Normal strength.  Fine motor movements.  No drift.  No myoclonus.  Sensory examination:  Peripheral polyneuropathy and  paresthesias to his knees.  Cerebellar examination good  finger-to-nose.  Gait was antalgic, slightly broad-based.  Deep tendon reflexes were  diminished in the upper extremities, absent in the legs.  The patient  had bilateral flexor plantar responses.   IMPRESSION:  Delirium 293.0.  I believe this is multifactorial and would  be accounted for by lack of sleep, changes in narcotics, addition of  amitriptyline where he usually uses trazodone.  He seems genuinely  better based on the history that I have obtained from his wife.   PLAN:  Discontinue amitriptyline and restart trazodone at a lower dose  25 mg at bedtime.  Try to leave the patient alone to sleep between  midnight and 6:00 a.m.  Discharged to home as soon as it is possible  medically and surgically to do so to get him into his own environment  which should help.  I do not believe that further workup is needed, and  I believe that this will resolve on its own as long as we can return him  back to the medicines that he is used to taking in the time frame that  he takes them.      Deanna Artis. Sharene Skeans, M.D.  Electronically Signed     WHH/MEDQ  D:  04/29/2008  T:  04/29/2008  Job:  [id number]   cc:   Ardeth Sportsman, MD  [address] [place] [address]

## 2011-05-08 NOTE — Procedures (Signed)
CAROTID DUPLEX EXAM   INDICATION:  Carotid stenosis with known carotid disease.   HISTORY:  Diabetes:  Yes.  Cardiac:  No.  Hypertension:  Yes.  Smoking:  Yes.  Previous Surgery:  Right carotid endarterectomy with DPA on 07/28/08.  CV History:  TIA before surgery.  Amaurosis Fugax No, Paresthesias No, Hemiparesis No.                                       RIGHT             LEFT  Brachial systolic pressure:         118               110  Brachial Doppler waveforms:         Triphasic         Triphasic  Vertebral direction of flow:        Antegrade         Antegrade  DUPLEX VELOCITIES (cm/sec)  CCA peak systolic                   104               86  ECA peak systolic                   104               238  ICA peak systolic                   143               Occluded  ICA end diastolic                   63                Occluded  PLAQUE MORPHOLOGY:                  N/A               Mixed  PLAQUE AMOUNT:                      N/A               Severe  PLAQUE LOCATION:                    N/A               ICA, ECA   IMPRESSION:  1. Right internal carotid artery velocities are suggestive of 40% to      59% stenosis distal to patch.  No significant stenosis with mild      intimal thickening in proximal internal carotid artery.  2. Left known internal carotid artery occlusion.  3. Bilateral vertebral arteries show antegrade flow.   ___________________________________________  Quita Skye Hart Rochester, M.D.   NT/MEDQ  D:  04/04/2010  T:  04/04/2010  Job:  [id number]   cc:   Bobby Rumpf, MD

## 2011-05-08 NOTE — Assessment & Plan Note (Signed)
Mr. Essick is a __________-year-old gentleman who is being seen in our  pain and rehabilitative clinic for chronic low back pain, right knee  pain.   He is status post lumbar diskectomy 2005, Dr. Danielle Dess.  He continues to  have chronic low back pain as well as bilateral leg pain, especially  knee pain.   Average pain is about a 4 or 5 on a scale of 10.  Described as constant,  tingling and aching.  Tingling is into the left thigh region.  His pain  is typically worse with walking, bending, sitting and standing for  prolonged periods of time; improves with rest, medication, TENS unit.   He gets fair relief with the current medications that he is being  treated with from this clinic which include:  1. Norco 5/325 up to four times a day.  2. MS Contin 30 mg twice a day.  3. Neurontin 300 mg four times a day.  4. Amitriptyline 10 mg nightly.   He is able to walk about 10 minutes at a time, he is able to climb  stairs and drive.  He is independent with his self care.  He does help  out quite a bit around the house.  Denies depression or anxiety.  Denies  suicidal ideation, no problems controlling bowel or bladder.   Past medical, social, family history noncontributory.  He is smoking  about 5 cigarettes a day.   EXAMINATION:  Today blood pressure is 140/78, pulse 61, respirations 18,  99% saturated on room air.  He is a well-developed, well-nourished  gentleman who appears his stated age.  He is oriented x3.  Speech is  clear, affect is bright.  Alert, cooperative, and pleasant.  Follows  commands without difficulty.  Does not appear in any distress.   Transitions from sitting to standing with ease.  Gait in the room is  nonantalgic.  Tandem gait, Romberg's test are performed adequately.  Limitations are noted in lumbar motion in all planes.  Reflexes are 1+  at the patellar and Achilles tendon.  No abnormal tone is noted, no  clonus is noted.  Motor strength is in the 5 over 5 range  without focal  deficit in the lower extremities.  He has tenderness along the medial  joint line of both knees.   IMPRESSION:  1. Lumbago, status post lumbar diskectomy, Dr. Danielle Dess, November 21, 2004.  Intermittent neuropathic left lower extremity pain, improved      with the use of Neurontin.  2. Bilateral knee pain.  We would like to pursue further evaluation,      however patient is reluctant at this point.  Have offered referral      to orthopedist, he declines at this time.  3. Nicotine addiction.  He is currently down to about 5 cigarettes a      day.   PLAN:  Will refill the following medications for him:  Norco 5/325 one  p.o. q.i.d. #120, no refills; and MS Contin 30 mg one p.o. q.12 hours  #60, no refills.  He does not need refills on Neurontin nor  Amitriptyline at this time.   Mr. Tiegs has stated he would like to have some coping strategies to  help him manage at home.  Apparently he is living in a fairly stressful  situation currently that may be impacting his pain overall.  We would  like him to follow up with Dr. Leonides Cave for further evaluation and  possibly some help regarding coping strategies.   We will see Mr. Costlow back in 2 months, nursing visit next month for  refill of medications.           ______________________________  Brantley Stage, M.D.     DMK/MedQ  D:  07/17/2007 11:52:53  T:  07/17/2007 19:56:50  Job #:  [id number]   cc:   Gladstone Pih, Ph.D.  [address] [place]  [place] [address]

## 2011-05-08 NOTE — Assessment & Plan Note (Signed)
Bobby Henry is a 65 year old married gentleman who is followed in our  pain and rehabilitative clinic for chronic low back pain and leg pain.  He was last seen by me on March 03, 2008.  He is back in today for a  refill of his pain medications.   He states his average pain is about a 5/6 on a scale of 10.  Sleep tends  to be poor.  He gets fair to good relief with the current meds that he  is prescribed.  The pain is localized to the low back and radiates down  the anterolateral thigh of both lower extremities.   In the last month, he states he has been scheduled for a partial  gastrectomy for stomach ulcers by Dr. Michaell Henry on April 21, 2008.   FUNCTIONAL STATUS:  He is able to walk between 5-10 minutes at a time.  He is able to drive.  He has some difficulty with stairs.  Last time he  was employed was in March 1995.   He denies depression, anxiety, or suicidal ideation.  Denies bladder  control problems.   PAST MEDICAL HISTORY:  Otherwise unchanged.   SOCIAL HISTORY:  Otherwise unchanged.  Continues to smoke, less than a  half pack of cigarettes a day.  He lives with his wife.   FAMILY HISTORY:  Otherwise unchanged.   MEDICATIONS:  Provided by our clinic include:  1. Norco 5/325 up to 4 times a day.  2. MS Contin 30 mg twice a day.  3. Trazodone 50 mg half tablet to whole tablet at night.  4. Neurontin 300 mg four times a day.  5. Lidoderm 5% patch p.r.n.   PHYSICAL EXAMINATION:  VITAL SIGNS:  Blood pressure is 153/84, pulse 68,  respirations 18, 98% saturated on room air.  GENERAL:  He is a thin, adult gentleman who does not appear in any  distress.  He is oriented x3.  Speech is clear.  His affect is bright.  He is alert, cooperative, and pleasant.  He follows commands without  difficulty.  MUSCULOSKELETAL:  Reflexes are diminished in the lower extremities.  His  tone is normal.  He has good strength at hip flexors, knee extensors,  dorsiflexors, and plantar flexors.   Sensory exam was not done today.  He  is able to transition from sitting to standing without difficulty.  Gait  in the room is not antalgic.  He has a little difficulty with tandem  gait.  Romberg test is performed adequately.   IMPRESSION:  1. Lumbago, status post lumbar diskectomy by Dr. Danielle Henry on November 21, 2004.  2. Intermittent left lower extremity  neuropathic pain, improved with      the use of Neurontin.  3. Bilateral knee pain.  The patient prefers to defer evaluation at      this time.  4. Nicotine addiction, currently smoking less than 1/2 a pack.  5. New problems include his gastrointestinal problems and he will be      undergoing a partial gastrectomy on April 21, 2008, by Dr. Michaell Henry.   1. We will see him back in one month.  2. We will refill the following medications for him:      a.     MS Contin 30 mg one p.o. q.12 h. #60, no refills.      b.     Norco 5/325 one p.o. q.i.d. p.r.n. backache or leg pain,       #  120, no refills.      c.     Trazodone 50 mg one half to one tablet p.o. nightly p.r.n.       insomnia, #30.      d.     Neurontin 30 mg one p.o. q.i.d., #120 with two refills on       both of those two.  He is stable on these medications.  He does not display any aberrant  behavior.  He takes his medications as prescribed.  He is not over  sedated, nor does he have problems with constipation at this time.  1. I will see him back in a month.           ______________________________  Bobby Henry, M.D.     DMK/MedQ  D:  03/29/2008 14:11:48  T:  03/29/2008 14:40:16  Job #:  [id number]

## 2011-05-08 NOTE — Assessment & Plan Note (Signed)
Mr. Gaulin is a 65 year old married gentleman who is being seen in our  pain and rehabilitative clinic predominantly for chronic low back pain  and left lower extremity weakness, bilateral knee pain.   He is known to have osteoarthritis of both knees and neuropathic pain of  the left lower extremity.  He is status post a discectomy by Dr. Danielle Dess  back in November 21, 2004.   His average pain is about a 6 or 7 on a scale of 10.  Sleep tends to be  poor.  Pain is localized mainly to the low back and bilateral knees.   He gets fair relief from the medications prescribed by this clinic which  include Amitriptyline 10 mg at night, MS Contin 30 mg twice a day and  Norco 5/325 up to 4 times a day for his back pain.   He has been participating in a physical therapy program which has been  emphasizing quadriceps and hip flexor strengthening.   He notes an overall improvement in his strength and his stability on the  stairs.   No other changes in past medical history, social or family history since  our last visit.   EXAMINATION:  Blood pressure is 129/78, pulse 66, respirations 16, 98%  saturated on room air.   He is a thin, adult gentleman who appears his stated age.  He does not  appear in any distress.  He is oriented x3.  His affect is bright,  alert, cooperative and pleasant.  He follows commands without difficulty  and his speech is clear.   He transitions from sit-to-stand easily.  Gait in the room is stable and  nonantalgic.  He has a little trouble with tandem gait.  He performs it  slowly, however.  Romberg's test is performed adequately.   He has limitations in lumbar motion in all planes.  Seated his reflexes  are 2+ at the patellar tendons, 1+ at the right Achilles tendon, 0 at  the left Achilles tendon.   Motor strength is improved in the left quadriceps.  It is in the 5/5  range now.  Right lower extremity is 5/5 at the hip flexors, knees  flexors, dorsiflexors,  plantar flexors.  Left lower extremity still  displays some hip flexor weakness about a 4-/5 in the left hip flexor.  The rest of the lower extremity is in the 5/5 range.   IMPRESSION:  1. Status post lumbar discectomy by Dr. Stefani Dama November 21, 2004.  2. Intermittent left neuropathic lower extremity pain improved with      the use of Neurontin.  3. Left lower extremity weakness, overall improvement with strength      with physical therapy over the last 3 weeks.  4. History of cervical spine disease.  5. Bilateral knee pain with history of OA.  6. Nicotine addiction.   PLAN:  We will refill the following medications for him:  Norco 5/325 1  p.o. q.6 hours p.r.n. back pain.  1. MS Contin 30 mg 1 p.o. b.i.d. #60.  2. Amitriptyline 10 mg 1 p.o. nightly p.r.n. 3 refills.   On the way out, Mr. Keehan noted that he has a little swelling above his  right ear.  He asked me to look at it.  It is about 2 cm x 2 cm and it  is a small, soft lesion above the right ear.  Apparently he is going to  have it evaluated within the next  week by Dr. __________ .  We will see  Mr. Blatt back in a month.  He has been stabile on his pain  medications.  He takes them as prescribed.  Displays no aberrant  behavior and is getting adequate relief and is able to maintain a very  functional life style despite the multiple impairments that he has.           ______________________________  Brantley Stage, M.D.     DMK/MedQ  D:  04/21/2007 11:14:41  T:  04/21/2007 11:56:53  Job #:  [id number]

## 2011-05-08 NOTE — H&P (Signed)
NAME:  Bobby Henry, Bobby Henry               ACCOUNT NO.:  [id number]   MEDICAL RECORD NO.:  [id number]          PATIENT TYPE:  OBV   LOCATION:  3312                         FACILITY:  [facility]   PHYSICIAN:  Santiago Bumpers. Hensel, M.D.DATE OF BIRTH:  Dec 27, 1945   DATE OF ADMISSION:  07/20/2008  DATE OF DISCHARGE:  07/22/2008                              HISTORY & PHYSICAL   PRIMARY CARE PHYSICIAN:  Bobby Rumpf, MD   CHIEF COMPLAINT:  Dizziness.   HISTORY OF PRESENT ILLNESS:  This is a 65 year old male with a history  of hypertension, hyperlipidemia, tobacco abuse, and diabetes mellitus  type 2, who presents to the emergency department with dizziness and  other complaints.  The patient is here with his daughter and son.  He  states for the past three weeks, he has had intermittent dizziness which  he describes as disequilibrium - does not feel like he is going to pass  out, has not had syncopal episodes, and no sensation like the room is  spinning.  These episodes are not worse from sitting to standing  necessarily.  He has had a headache off and on for the past 3-4 days  that starts frontal and goes all over his head.  He has had chest  pain/epigastric pain once on Sunday that lasted a few hours and went  away on its own.  Otherwise, no chest pain, dyspnea, palpitations around  times with his dizziness.  Per daughter, he has had trouble with speech  noted today as he is slurring words, saying some strange things like  he is using the wrong words occasionally.  He did this twice during my  interview, but corrected himself quickly.  He stated he smoked a week  per day then corrected to less than a half pack per day.  He also said  [facility] when asked about his dizziness, but then corrected himself.  He perseverated some on his headache when asked about his dizziness as  well.  He denies weakness/numbness.  No visual or hearing changes.  No  dysphagia.  He has trouble with his balance, but this is  not new and is  a result of back injury years ago for which he is on disability.   PAST MEDICAL HISTORY:  1. Hypertension.  2. Hyperlipidemia.  3. Type 2 diabetes mellitus.  4. Gastroesophageal reflux disease.  5. Tobacco abuse.  6. Solitary pulmonary nodule.   ALLERGIES:  No known drug allergies.   MEDICATIONS:  1. Amitriptyline 10 mg nightly.  2. Crest or 10 mg nightly.  3. Enalapril/hydrochlorothiazide 10/25 two tabs p.o. daily.  4. Gabapentin 300 mg t.i.d.  5. Glucotrol 10 mg b.i.d.  6. Metformin XL 750 mg daily.  7. MS Contin 30 mg b.i.d.  8. Nexium 40 mg b.i.d.  9. Norvasc 5 mg daily.  10.Toprol-XL 100 mg 1-1/2 tablets daily.  11.Niacin 750 mg daily.   PAST SURGICAL HISTORY:  1. Laparoscopic gastrojejunostomy and laparoscopic high selective      anterior and posterior vagotomies for presumed peptic ulcer disease      in May 2008.  2. Back  surgery.  3. C-spine surgery.  4. New surgery.   FAMILY HISTORY:  He has two sisters with bipolar.  Father who has had  coronary artery bypass grafting and has had an MI and also arthritis and  mother with Alzheimer's and arthritis.   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  Wife has cancer.  He  is a primary caretaker of twins and has smoked cigarettes for greater  than 13 years, but is now at one pack per day and is now down to less  than one half pack per day.  Denies any drug or alcohol abuse.   REVIEW OF SYSTEMS:  See HPI.   PHYSICAL EXAMINATION:  VITAL SIGNS:  Temperature 99.4, pulse 81,  respirations 18, blood pressure 151/91, pulse ox 96% on room air.  GENERAL:  No acute distress, well developed, well nourished, alert, and  cooperative.  HEENT:  Head normocephalic, atraumatic.  Pupils equal, round, and  reactive to light.  Extraocular movements intact.  No conjunctival  inflammation.  Pharynx is normal without erythema or exudate.   Mucous  membranes are moist.  External ear exam is normal.  TMs without bulging  or erythema.  No nasal discharge.  NECK:  No lymphadenopathy, thyromegaly, masses, or bruits.  CARDIOVASCULAR:  Regular rate and rhythm.  No murmurs, rubs, or gallops.  LUNGS:  Normal respiratory effort and clear to auscultation bilaterally.  No wheezes, rales, or rhonchi.  ABDOMEN:  Soft, nontender, and nondistended.  Bowel sounds are  normoactive.  No hepatosplenomegaly.  No masses.  EXTREMITIES:  Warm and well-perfused.  No edema or cyanosis.  SKIN:  No rashes or lesions on visible skin.  NEURO:  Alert and oriented x4.  Cranial nerves II through XII grossly  intact.  Strength 5/5 all extremities.  Sensation intact to light touch  except over the left hip and this was not new.  Muscle stretch reflexes  are 2+ and symmetric in patellar, brachioradialis, biceps, and gastroc  tendons.  Romberg is negative.  No dysmetria, dysdiadochokinesia, and  normal heel-to-shin.  Normal gait and toe gait is normal.  Unable to do  tandem but states he has been unable to do this since his back injury  and surgery.  He does have some word-finding difficulties, but no  dysarthria.   LABORATORIES AND STUDIES:  Blood cell count 10.2, hemoglobin 14.5,  hematocrit 42.5, platelets 277, sodium 130, potassium 3.4, chloride 94,  bicarb 26, BUN 22, creatinine 1.09, glucose 241, calcium 8.9, protein  6.7, albumin 3.8, and AST 20.  Urinalysis:  Specific gravity 1.021,  hemoglobin, negative, leukocyte esterase and nitrites negative.   Head CT:  No acute abnormality.   EKG:  Normal sinus, no ST-T or Q-wave changes.   ASSESSMENT AND PLAN:  This is a 65 year old male with;  1. Dizziness.  He has several risk factors for stroke, but head CT is      negative and neuro exam is now normal except for occasional word      finding mistakes.  TIA is also possible.  We will start aspirin 81      mg daily and this should be okay even with his  history of peptic      ulcer disease given that he is on PPI b.i.d.  We will neuro checks      q.6 h.  Consider MRI of brain to further evaluate for stroke,  but      this may not change the management, so we will discuss with      attending.  We will order carotid Doppler ultrasounds.  We will      modify risk factors as noted below.  We will order PT, OT consult.      The patient is walking okay currently.  We will do swallow eval and      place him on a heart-healthy diet.  2. Diabetes mellitus type 2.  We will check an A1c.  His last one was      7.1 in January 2009, but his glucose is 241 on basic metabolic      panel here.  We will place him on moderate sliding scale insulin      q.a.c. and check CBGs at bedtime.  We will continue his home      metformin and Glucotrol given his creatinine is normal.  We will      continue ACE inhibitor.  We will start aspirin 81 as noted above.      Consider adding an Actos if additional meds are needed versus      increasing metformin.  3. Hypertension.  Initial blood pressure was elevated but is now      improved.  No acute stroke on head CT and no focal neuro symptoms.      He feel comfortable.  Continuing his home regimen to keep his blood      pressure in the normal range.  We will decrease hydrochlorothiazide      component of blood pressure meds to 25 mg daily.  Decrease Toprol      to 100 mg daily.  It generally do not support extended release      pills and we will titrate up as needed.  4. Hypercholesterolemia.  LDL was 56 in March 2009.  We will check      full lipid panel in the morning and continue Crestor and Niaspan in      the meantime.  5. Tobacco dependence.  He smokes less than the one-half pack per day      and I have given nicotine patch at this time.  Smoking cessation      consult.  6. Hypokalemia.  We will replete his K.  7. Gastroesophageal reflux.  We will continue Nexium b.i.d.      Norton Blizzard, M.D.   Electronically Signed      Santiago Bumpers. Leveda Anna, M.D.  Electronically Signed    SH/MEDQ  D:  07/22/2008  T:  07/23/2008  Job:  [id number]

## 2011-05-08 NOTE — Procedures (Signed)
CAROTID DUPLEX EXAM   INDICATION:  Carotid artery disease.   HISTORY:  Diabetes:  Yes.  Cardiac:  No.  Hypertension:  Yes.  Smoking:  Yes.  Previous Surgery:  No.  CV History:  No.  Amaurosis Fugax No, Paresthesias No, Hemiparesis No                                       RIGHT             LEFT  Brachial systolic pressure:         100               98  Brachial Doppler waveforms:         Normal            Normal  Vertebral direction of flow:        Antegrade         Antegrade  DUPLEX VELOCITIES (cm/sec)  CCA peak systolic                   87                65  ECA peak systolic                   82                161  ICA peak systolic                   153               Occluded  ICA end diastolic                   59  PLAQUE MORPHOLOGY:                  Heterogeneous     Heterogeneous  PLAQUE AMOUNT:                      Mild/moderate     Varied  PLAQUE LOCATION:                    ICA/ECA/CCA       ICA/ECA/CCA   IMPRESSION:  1. A 40-59% stenosis of the right internal carotid artery with mild      vessel tortuosity noted.  2. Totally occluded left internal carotid artery noted.      ___________________________________________  Quita Skye Hart [place], M.D.   CH/MEDQ  D:  07/26/2008  T:  07/27/2008  Job:  [id number]

## 2011-05-08 NOTE — Procedures (Signed)
CAROTID DUPLEX EXAM   INDICATION:  Follow up carotid artery disease.   HISTORY:  Diabetes:  Yes.  Cardiac:  No.  Hypertension:  Yes.  Smoking:  Yes.  Previous Surgery:  Right CEA with DPA, 07/28/08.  CV History:  TIA before surgery.  Amaurosis Fugax No, Paresthesias No, Hemiparesis No.                                       RIGHT             LEFT  Brachial systolic pressure:         126               128  Brachial Doppler waveforms:         WNL               WNL  Vertebral direction of flow:        Antegrade         Antegrade  DUPLEX VELOCITIES (cm/sec)  CCA peak systolic                   87                100  ECA peak systolic                   163               255  ICA peak systolic                   155               Occluded  ICA end diastolic                   67                Occluded  PLAQUE MORPHOLOGY:                  N/A               Mixed  PLAQUE AMOUNT:                      N/A               Severe  PLAQUE LOCATION:                    N/A               ICA/ECA   IMPRESSION:  1. Right internal carotid artery velocities are suggestive of 40-59%      stenosis distal to patch and showing an increase from previous      study; however, no significant stenosis visualized with mild      intimal thickening noted in the proximal internal carotid artery.  2. Left internal carotid artery known occlusion.  3. Left external carotid artery stenosis.   ___________________________________________  Quita Skye Hart [place], M.D.   AS/MEDQ  D:  08/09/2009  T:  08/09/2009  Job:  [id number]

## 2011-05-08 NOTE — Assessment & Plan Note (Signed)
[name] is a 65 year old married gentleman who is followed in our  pain rehabilitative clinic for chronic low back pain, let pain.  He is  back in today for a refill of his medications.  He was last seen by me  on March 29, 2008.   In the interim, he has undergone a diagnostic laparoscopic kocherization  of the duodenum, laparoscopic gastrojejunostomy, a laparoscopic high  selective anterior and posterior vagotomies.  These procedures were  complicated by a cellulitis and infection of his dorsal right wrist  during hospitalization and he underwent I and D of this as well on Apr 25, 2008.   The hospitalization was also complicated by a multifactorial deliriums  which may be related to some of the medications he had been on.   [name] is back in today and states that he has done quite well.  He  states he does not really remember much about his hospitalization but  overall feels quite good at this time.  States is starting to eat again  and is just requesting a refill of his medication.   He states his average pain is between a 5 and a 6 on a scale of 10  predominately located in the left lower lumbar region, also somewhat  down the left leg.  Pain is typically worse with walking and bedding,  improves with rest, medication and his TENS unit.  Pain is described as  variable sometimes it is more intermittent, more constant, sharp, aching  in nature.  He gets better relief with current meds provided by this  clinic.   FUNCTIONAL STATUS:  [name] is able to walk 5 to 10 minutes at a  time.  He has some difficulty with stairs.  He is able to drive.  He is  independent with his self-care.   Denies problems controlling bowel or bladder.  Denies numbness,  tingling, tremors.  Denies depression, anxiety or suicidal ideation.   Review of systems otherwise negative.  He overall feels quite good.  He  has reduced his smoking down to 3-4 cigarettes a day.   MEDICATIONS:  Provided by  this clinic:  1. Norco 5/325 up to 4x a day.  2. MS-Contin 30 mg twice a day.  3. Trazodone 1/2 tablet to 1 whole tablet at night.  4. Neurontin 300 mg 4x a day.  5. Lidoderm patch p.r.n.   PHYSICAL EXAMINATION:  Blood pressure is 120/64.  Pulse 76.  Respirations 20, 97% saturated on room air.  He is a thin adult gentleman who appears his stated age.  He is oriented  x3, speech is clear, his affect is bright, he is alert, cooperative and  pleasant.  Follow commands easily.  Transitioning from sitting to standing is done with ease.  His gait in  the room is stable.  A 10 gait Romberg's test performed adequately.  Reflex is 2+ at the patellar tendon , 1+ at the Achilles tendon on the  right, 0 at the Achilles tendon on the left.  He has good strength in  his lower extremities.  He has a half a grade of weakness in the left  hip flexors compared to the right but does report some increased back  pain when he exerts to reflex his hip.  No new sensory deficits are appreciated.   IMPRESSION:  1. Lumbago status post lumbar diskectomy with Dr. Danielle Dess November 21, 2004.  2. Status post diagnostic laparoscopy __________ of  duodenum,      gastrojejunostomy and selective anterior and posterior vagotomies      by Dr. Karie Soda.  3. Recent incision and drainage of right wrist for cellulitis and      infection by Dr. Priscille Kluver Apr 25, 2008.   His other problems include bilateral knee pain, nicotine addiction.   We will refill the following medications for him today, and we will give  him Ambien 5 mg one p.o. at bedtime p.r.n. insomnia #15.  He is  completely off of his trazodone.  Apparently he was told by his  gastroenterologist to discontinue this medication.  We will give him 15  of the 5 mg Ambien per month, Norco 5/325 one p.o. four times a day  p.r.n. back pain and leg pain #120 no refills, and MS-Contin 30 mg one  p.o. every 12 hours #60 no refills.  He has been stable on the  above  medications.  He has not exhibited aberrant behavior  with them.  He is  able to maintain a relatively functional lifestyle despite muscle  impairments.           ______________________________  Brantley Stage, M.D.     DMK/MedQ  D:  05/10/2008 13:48:17  T:  05/10/2008 16:30:33  Job #:  [id number]

## 2011-05-08 NOTE — Op Note (Signed)
NAME:  Bobby Henry, Bobby Henry NO.:  [id number]   MEDICAL RECORD NO.:  [id number]          PATIENT TYPE:  INP   LOCATION:  0001                         FACILITY:  [facility]   PHYSICIAN:  Ardeth Sportsman, MD     DATE OF BIRTH:  16-Feb-1946   DATE OF PROCEDURE:  04/21/2008  DATE OF DISCHARGE:                               OPERATIVE REPORT   PRIMARY CARE PHYSICIAN:  Benn Moulder, M.D.   GASTROENTEROLOGIST:  Shirley Friar, MD   Patient is also followed by [facility].   SURGEON:  Karie Soda, M.D.   ASSISTANT:  Adolph Pollack, M.D.   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   POSTOPERATIVE DIAGNOSIS:  Gastric outlet obstruction secondary to  chronic prepyloric peptic ulcer.   PROCEDURES PERFORMED:  1. Diagnostic laparoscopy.  2. Laparoscopic kocherization of the duodenum.  3. Laparoscopic gastrojejunostomy.  4. Laparoscopic high selective anterior and posterior vagotomies.   ANESTHESIA:  By general anesthesia.  Local anesthetic as a field block  at all port sites.   SPECIMENS:  None.   DRAINS:  A 19-French Blake drain rests across on top of the anastomosis  and goes down over across the stomach and over to behind the spleen.   ESTIMATED BLOOD LOSS:  200 mL.   COMPLICATIONS:  No major complications.   INDICATIONS:  Mr. Agne is a pleasant 65 year old male who has had a  chronic peptic ulcer for some time and he has head treatments.  He used  to be having intake of NSAID's but he has off that and has been quite  compliant under Dr. Marge Duncans care.  However, in spite of this he has  persistent ulceration.  Biopsies have been done and these have been  benign.  We have concerns he was sent for me to consideration in either  resection or bypass of the area.  Anatomy and physiology of digestive  tract was explained.  Pathophysiology of ulceration with its risks of  obstruction,  bleeding, etc. were discussed in detail.  Options discussed  and recommendations made for diagnostic laparoscopy with possible distal  gastrectomy with Billroth I or II anastomosis versus palliative  gastrojejunostomy.   Risks, benefits and alternatives were discussed in details.  Options  were discussed in details.  His questions were answered and he agreed to  proceed.   OPERATIVE FINDINGS:  His stomach was not particularly dilated.  He had  inflammation around his pylorus.  He had a thick amount of inflammation  on the first of his duodenum, especially the head of his pancreas as  well as his common bile duct that made it very difficult to free the  duodenum off these key structures safely.  There was no major  lymphadenopathy or any evidence of metastatic disease.  The serosa  mucosa anteriorly seemed normal just with no strong evidence of cancer.  The rest of the abdomen appeared to be normal as well.   DESCRIPTION OF PROCEDURE:  Informed consent was confirmed. The patient  received IV antibiotics just prior to  surgery.  Sequential compression  devices were active during the entire case.  He was positioned supine  with both arms tucked on the split leg table.  He underwent general  anesthesia without any difficulty.  He had Foley catheter sterilely  placed.  The abdomen was clipped, prepped and draped in sterile fashion.   A 5 mm port was placed in the left upper quadrant using optical entry  technique.  The capnoperitoneum 15 provided good intra-abdominal  insufflation.  Under visualization, 5 mm ports were placed in the left  subcostal region and through the umbilicus and in the left subxiphoid  region.  The left lower quadrant 5 mm port was upgraded to a 12 mm port.  Another 12 mm port was placed in the right upper quadrant as well.  The  epigastric port was removed and switched over to a Nathanson liver  retractor to help lift the left lobe of the liver anteriorly for  better  exposure of the stomach of the esophageal hiatus as well as down the  pylorus.   Attention was turned towards diagnostic laparoscopy which revealed no  evidence of any diffuse metastatic disease.  There were some moderate  adhesions of omentum and some duodenum to the gallbladder.  Gallbladder  was elevated cephalad and these were freed off using ultrasonic  dissection.  I went ahead and got into the lesser sac by leaving the  stomach anteriorly and taking the gastrocolic attachment just inferior  to the gastropelvic vessels to keep that intact on the stomach.  This  was carried along the greater curvature about halfway up and then  carried down towards the pylorus as well.  On palpation you could see  the pylorus and what appeared to be a stricture right near that area as  well.   The duodenum with Kocherized and the first and second parts of the  duodenum and starting onto the third part as well in a lateral medial  fashion rather well.  In trying to Kocherize over could follow the  gallbladder to the cystic duct to the common bile duct to follow the  common bile duct as it ran posterior to the first part of the duodenum.  At this point it was rather very inflamed.  There was no definite  evidence of cancer or metastatic disease, but it was very bloody and  swollen.  It was hard to get clean planes around here and attempts at  dissection it was getting very close to near the common bile duct and  the pancreas itself.  Posterior also could get  part of it, but it was  very difficult to ascertain exactly where the common bile duct plugged  in.   A discussion was made by Dr. Abbey Chatters and myself and after numerous  attempts to try and get around this safely we were worried that we would  cause significant dissection near the pancreas and common duct and the  morbidity of resection and in this case that had no definite evidence of  malignancy it was too high.  Therefore, we  decided to palliate and do a  gastrojejunostomy.   The transverse colon was elevated cephalad and the proximal jejunum at  the ligament of Treitz was identified.  We ran at about 34 cm.  There  was a nice loop that easily came antecolic up to the stomach.  It was  secured to the posterior wall of the stomach on the antimesenteric side  using intracorporeal suturing  with 2-0 silk stitches x2.  A gastrotomy  was made in the posterior wall of the distal greater curvature of the  stomach and an enterotomy was made on the jejunum on the antimesenteric  side. A  gastrojejunostomy stapled anastomosis was done using a 60 mm  stapler to good result.  Inspection revealed healthy viable tissue with  no evidence of bleeding.  The staple defect was closed using a 2-0 PDS  running stitch.  Inspection revealed a couple loose areas and these were  closed using 2-0 figure-of-eight silk stitches.  Reinspection of the  closure of the staple hole revealed excellent closure and good  viability.   The resulting new mesenteric defect was closed using running 2-0 silk  stitch going from the pylorus of the stomach and along the jejunum of  the gastrojejunostomy all the way down towards its base so that there  would be no potential internal hernias that could result in internal  herniation later.   Attention was turned towards the vagotomy.  The gastrohepatic omentum  was ligated using ultrasonic dissection along the lesser curvature up  towards the right crus.  It was able to get into the mediastinum between  the right crus and the esophagus bluntly.  We immediately saw the  posterior vagus nerve and this was isolated and skeletonized and  skeletonized about 5 cm proximally.  It was followed distally as it came  across the esophagogastric junction and along the posterior lesser curve  and skeletonized further and down into the second crow's foot of the  lesser curvature of the stomach.  The dissection was  done on the  anterior; we went back in the esophagogastric junction and found the  anterior vagus anteriorly.  This was elevated with a Penrose drain.  It  was skeletonized approximately about 5 cm proximally as well and then  the anterior vagus branch was followed distally along the anterior side  of the lesser curvature of the stomach until we got to about the second  crow's foot as well.  It did seem to thin down and there was a major  branching in the anterior vagus.  At the anterior vagus it was a little  more skeletonized, but we had a pretty good posterior branch.   Copious irrigation was done with clear return.  Hemostasis was ensured.  I Placed a 19-French Blake drain as described above and secured it to  the skin using 2-0 nylon stitch.  Inspection was made circumferentially.  An inspection was made of the anastomosis which was viable with no  evidence of ischemia or leak.  Reinvestigation was again made at the  suture line and it was intact as well.  The capnoperitoneum was  evacuated and ports were removed.  The 12 mm port site was closed using  a zero Vicryl stitch at the fascia.  Skin was closed using 4-0 Monocryl  stitch.  Sterile dressing was applied.  The patient extubated and sent  to the recovery room in stable addition.   I explained the operative findings to the patient's family.      Ardeth Sportsman, MD  Electronically Signed     SCG/MEDQ  D:  04/21/2008  T:  04/21/2008  Job:  [id number]   cc:   Benn Moulder, M.D.   Shirley Friar, MD  Fax: [phone]

## 2011-05-08 NOTE — Assessment & Plan Note (Signed)
[name] is a 64 year old married gentleman who is followed in  our pain and rehabilitative clinic for chronic low back pain and left  leg pain.   He is status post lumbar diskectomy by Dr. Danielle Dess back in November, 2005  but continues to have intermittent neuropathic left lower extremity  pain, improved with the use of Neurontin.   He is back in today for a refill of his medications.  He states that his  average pain in his back is about a 5 or 6 on a scale of 10.  Sleep  tends to be poor to fair.  He gets fair relief with current meds that he  is on.   He states that he has had another problem in the interim.  He has had  some gastrointestinal problems and is following up with Dr. Bosie Clos for  this.  Apparently, he has had a workup which includes CT of the stomach  and barium swallow and apparently possible surgery is planned for him in  the upcoming weeks.   [name] states that he can walk about 5-10 minutes at a time.  He is  able to drive.  He is independent with his self-care and tends to be a  high-functioning individual, helping take care of his household.   He denies problems controlling bowel or bladder.  He denies depression,  anxiety, or suicidal ideations.   He has had quite a few problems with nausea, vomiting, diarrhea, poor  appetite, and abdominal pain, and he is seeing Dr. Bosie Clos for this and  is getting his treatment through Dr. Marge Duncans office.   No changes in social or family history.  He smokes about 3-5 cigarettes  a day now.   Medications provided by our clinic include Norco 5/325, up to 4 times  per day; MS Contin 30 mg twice daily; trazodone 50 mg 1/2 tablet  nightly; Neurontin 300 mg, up to 4 times a day.   PHYSICAL EXAMINATION:  Blood pressure is 128/71, pulse 70, respirations  18, 98% saturated on room air.  He is a well-developed, thin adult gentleman who does not appear in any  distress.  He is oriented x3.  His speech is clear.   His affect is bright.  He is  alert, cooperative, and pleasant.  He follows commands without any  difficulty.  He transitions from sitting to standing without problems.  Gait in the  room is nonantalgic.  Limitations are noted in lumbar motion in all  planes.  Reflexes are 1+ in the left patellar tendon and bilateral  Achilles tendon, 2+ at the right patellar tendon.  Tone is normal.  Motor strength is good in both lower extremities without focal deficit.  Tandem gait and Romberg's test are also performed adequately.   IMPRESSION:  1. Lumbago, status post lumbar diskectomy, Dr. Danielle Dess on November 21, 2004.  2. Intermittent left lower extremity neuropathic pain which is      improved with the use of Neurontin.  3. Bilateral knee pain.  The patient is deferring further evaluation      at this time.  4. Nicotine addiction, down to 3-5 cigarettes a day.   New problems include gastrointestinal problems.  He is being followed  currently by Dr. Bosie Clos, and some interventions are planned within the  upcoming weeks.   [name] is stable on the above medications.  The following  prescriptions are written for him today.  Lidoderm 5% patch 12  hours on,  12 hours off, one month's supply.  MS Contin 30 mg 1 p.o. b.i.d., #60.  Norco 5/325 1 p.o. q.i.d. p.r.n. back pain, #120, no refills.   We will see him back in a month.           ______________________________  Brantley Stage, M.D.     DMK/MedQ  D:  03/03/2008 14:32:08  T:  03/03/2008 22:19:34  Job #:  [id number]

## 2011-05-08 NOTE — Assessment & Plan Note (Signed)
Bobby Henry is a 65 year old married gentleman who is followed in our  pain and rehabilitative clinic predominantly for low back pain and  intermittent radiating upper extremity pain.   He is back in today for a refill of his medications.  He states his  average pain is a 6 or a 7 on a scale of 10.  Described as dull and  aching across the low back, somewhat into the buttocks on the left.  He  gets fair relief with the current meds that he is on.  Sleep tends to be  poor.  Pain is worse with walking, bending, sitting, standing.   MEDICATIONS PRESCRIBED BY OUR CLINIC:  1. MS Contin 30 mg twice a day.  2. Norco 5/325 one p.o. q.6 h. p.r.n. back pain #120 per month.   FUNCTIONAL STATUS:  The patient is able to walk about 5 minutes at a  time.  He reports difficulty climbing stairs.  He is able to drive.  He  is independent with his self care, and helps out with light household  tasks at home.  Denies any problems with respect to review of systems on  the health and history form.  No changes in his past medical, social, or  family history since last visit.   PHYSICAL EXAMINATION:  Blood pressure is 145/81, pulse 68, respirations  18, 98% saturated on room air.  He is a well-developed, well-nourished gentleman who appears his stated  age.  He is oriented x3.  His speech is clear.  His affect is bright.  He is alert, cooperative, and pleasant.  He follows commands without  difficulty.  Transitioning from sit to stand is done with ease.  Gait in the room is non antalgic.  He has limitation in lumbar mobility  in all planes.  Reflexes are 1+ at the patellar tendons, 1+ at the Achilles tendon,  diminished at the left Achilles tendon.  Motor strength is in the 5/5 range without focal deficit.  He has patchy  areas of numbness in the left lower extremities.  Straight leg raise is  negative.  Balance is quite good.  No abnormal tone is noted.  No clonus  is noted.   IMPRESSION:  1. Lumbago,  status post lumbar diskectomy, Dr. Ilean Skill, November 21, 2004.  2. Intermittent neuropathic left lower extremity pain which is      improved with the use of Neurontin.  3. Bilateral knee pain.  The patient has deferred radiograph and      orthopedic referral for several visits now.  4. Nicotine addiction.  He is down to a half of a pack of cigarettes a      day.   PLAN:  Will refill his Norco 5/325 1 p.o. q.6 h. p.r.n. back pain #120,  MS Contin 30 mg 1 p.o. q.12 h. #60, Neurontin 300 mg 1 p.o. q.i.d., this  is due in February.  Again, he does not need amitriptyline at this  point.  Will see him back in a month.  He has been stable on these  medications and has not exhibited aberrant behavior in our clinic.  Pill  counts are appropriate, takes his medications as prescribed.           ______________________________  Bobby Henry, M.D.     DMK/MedQ  D:  11/07/2007 13:41:19  T:  11/08/2007 08:49:51  Job #:  [id number]

## 2011-05-08 NOTE — Assessment & Plan Note (Signed)
HISTORY OF PRESENT ILLNESS:  Bobby Henry is a 65 year old married  gentleman who recently underwent partial gastrectomy in April 2009 by  Dr. Michaell Cowing.  He is also status post lumbar discectomy, Dr. Danielle Dess,  November 21, 2004.   He has also had some problems with cellulitis of the right wrist  infection treated by Dr. Priscille Kluver, May 2009.   [name] is back into our Pain and Rehabilitative Clinic for pain  related to his low back and left leg.  His average pain is about 5-6 on  a scale of 10.  Pain is described as constant, aching, burning in  nature, and moderately interfering with his activity.  Sleep overall  improved with the use of Ambien every other day.   The patient's pain is typically worse with standing, bending, and  sitting and improves with rest, medication, and TENS unit.  He gets fair-  to-good relief with current meds prescribed by this clinic.   MEDICATIONS:  1. Norco 5/325 up to 4 times a day.  2. MS Contin 30 mg twice a day.  3. Ambien 5 mg 1 p.o. at bedtime 15 per month.  4. Neurontin 300 mg 4 times a day and p.r.n. Lidoderm.   Mr. Hanzlik is able to walk about 15 minutes at a time.  He has trouble  with stairs.  He is able to drive.  He is independent with his self  care.  Denies problems controlling bowel or bladder.  Denies depression,  anxiety, or suicidal ideation.   REVIEW OF SYSTEMS:  Otherwise, noncontributory.   He has gained about 15 pounds in the last month.  Overall, he is feeling  much better.  He has decreased his smoking to about a quarter of pack  per day.   Otherwise, past medical, social, and family history are unchanged.   PHYSICAL EXAMINATION:  VITAL SIGNS:  Blood pressure is 133/76, pulse 68,  respirations 18, and 99% saturated on room air.  GENERAL:  [name] is a thin adult gentleman, who appears his stated  age, and does not appear in any distress.  He is oriented x3.  Speech is  clear.  Affect is bright.  He is alert, cooperative, and  pleasant.   He is able to transition from sitting to standing easily.  Gait in the  room is non-antalgic.  Normal mechanics are noted.  He has limitations  of lumbar motion in all planes.  He has some trouble with tandem gait.  Romberg test is, however, performed adequately.   He has numbness over the left anterior thigh.  His motor strength is  excellent in the lower extremities.  Reflexes are diminished at the  patellar tendons and 1+ at the Achilles tendon.  Straight leg raise is  negative.   IMPRESSION:  Lumbago, status post lumbar discectomy, Dr. Danielle Dess,  November 21, 2004, status post diagnostic laparoscopy, gastrojejunostomy  and selective anterior and posterior vagotomies by Dr. Karie Soda,  April 2009, recent incision and drainage of right wrist for cellulitis,  Dr. Priscille Kluver, May 2009.   Other problems include bilateral knee pain, lumbago, nicotine addiction,  overall improving, down to a quarter pack cigarettes per day.   PLAN:  We will refill the following medications for him.  We will  decrease his morphine dose from MS Contin 30 mg b.i.d. to 15 mg t.i.d.  #90, Norco 5/325 up to 4 times a day, #120, no refills on either.  We  will refill his Ambien.  He  states it does seem to help him, 5 mg 1 p.o.  at bedtime, #15 per month, no refills.  We will see him back in 2  months.  We will have nursing visit see him next month.  We will  continue these medications.  He is tolerating them well and has no  problems with oversedation or constipation.  He takes his medications as  prescribed.  No aberrant behavior has been appreciated.  He is able to  maintain his function.  I have asked him to continue to do walking at  least daily 10-15 minutes.  Anticipate attempting to decrease his  morphine further in August to 15 twice a day.           ______________________________  Brantley Stage, M.D.     DMK/MedQ  D:  06/09/2008 11:02:32  T:  06/10/2008 01:07:20  Job #:   [id number]

## 2011-05-08 NOTE — Assessment & Plan Note (Signed)
[name] is a 65 year old married gentleman who is followed in  our Pain and Rehabilitative Clinic for chronic low back pain, left  residual thigh weakness, and right knee pain.   He was last seen by me on June 22, 2009.   In the interim, he states he has had an appointment with his vascular  surgeon, Dr. Hart Henry.  Bobby Henry is about 1 year status post a right  carotid endarterectomy.   Apparently overall, he is doing well.  He does not need another  appointment for 1 year now.  He continues to smoke about a half a pack  of cigarettes a day, he is cautioned against this.   With respect to our Pain and Rehabilitative Clinic, he indicates that  his average pain is 7 to an 8 on a scale of 10.  Predominant pain  complaints are the right knee right now.  He has severe tricompartmental  degenerative changes of the right knee and he intends to follow up with  an orthopedist in the next year to have this managed surgically.   He was walking on uneven ground last week and twisted his right knee.  He had some swelling which has improved.  He walks with a cane at this  point and has been for some time now.   Pain is typically worse with walking, bending, sitting, prolonged  sitting and standing, improves with medication, pacing his activities.   FUNCTIONAL STATUS:  He is able to walk about 5 minutes at a time.  He  does not climb stairs, has difficulty with this.  He is able to drive.  He is independent with self-care.  He has been disabled since 1995.   REVIEW OF SYSTEMS:  Positive for trouble walking, variations in blood  sugar.  He has a history of 10-year history of diabetes type 2, managed  with oral medications.   There is no other changes in social or family history since last visit.  No changes in past medical history other than that already noted.   He is requesting a refill of the long-acting morphine 15 mg in the  morning as well as his Vicodin which he takes up to  four 4 times a day  on a p.r.n. basis and he uses about 15 Ambien per month for insomnia.   On exam today, his blood pressure is 156/88, pulse 65, respirations 16,  99% saturated on room air.  He is a well-developed thin gentleman who  does not appear in any distress.  He is oriented x3.  Speech is clear.  His affect is bright.  He is alert, cooperative, pleasant.  Follows  commands without difficulty, answers my questions appropriately.   Cranial nerves and coordination are intact.  Reflexes are 0 at the right  patellar tendon, 2+ at the left patellar tendons, 2+ at the right  Achilles tendon, and 0 at the left Achilles tendon.  He has some mild  atrophy in the left quadriceps which is not new.  He reports some  diminished sensation over the left thigh anteriorly and also some  decreased sensation along the lateral aspect of both lower extremities.   Motor strength is, however, quite good, 5/5 at hip flexors, knee  extensors, dorsiflexors, plantar flexors, EHL.   Transitions from sitting to standing without difficulty.  Tandem gait is  performed with minimal difficulty.  Romberg test is performed  adequately.  Gait is antalgic.  Decreased decreased stance phase on the  right due to knee pain.   The evaluation of the right knee reveals mild effusion.  He is able to  extend the leg almost fully.  He lacks about 5 degrees, and he has about  140 degrees of right knee flexion.   IMPRESSION:  1. 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  Mr.      Henry plans to followup with orthopedist in the next 6-9 months.  2. History of low back pain, lumbago status post diskectomy, Dr.      Danielle Dess, November 21, 2004.  3. Intermittent left shoulder pain which is not a problem at this      time.   Medical problems include history of carotid endarterectomy, esophageal  reflux, hypertension, diabetes, nicotine addiction.   PLAN:  Cautioned the patient regarding smoking.  We will  refill his  morphine sulfate long acting 15 mg 1 p.o. q.a.m., Vicodin 7.5/325 one  p.o. q.i.d. p.r.n. back or knee pain #120 no refills, Ambien 5 mg 1 p.o.  at bedtime p.r.n. insomnia #15 per month.  We will see him back in 3  months'.  Nursing visit over the next 2 minutes for refill of his  medications.   Bobby Henry takes his narcotic pain medication responsibly.  He takes  them as prescribed.  No evidence of aberrant behavior has been observed.  Pill counts have been appropriate.           ______________________________  Brantley Stage, M.D.     DMK/MedQ  D:  08/17/2009 16:10:96  T:  08/18/2009 00:11:07  Job #:  [id number]

## 2011-05-08 NOTE — Discharge Summary (Signed)
Bobby Henry, Bobby Henry               ACCOUNT NO.:  [id number]   MEDICAL RECORD NO.:  [id number]          PATIENT TYPE:  INP   LOCATION:  3306                         FACILITY:  [facility]   PHYSICIAN:  Quita Skye. Hart Rochester, M.D.  DATE OF BIRTH:  04-21-1946   DATE OF ADMISSION:  07/28/2008  DATE OF DISCHARGE:  07/29/2008                               DISCHARGE SUMMARY   Patient of Dr. Hart Rochester.   FINAL DIAGNOSES:  1. 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  2. Dyslipidemia.  3. Non-insulin-dependent diabetes mellitus.  4. Hypertension.  5. Degenerative joint disease.   PROCEDURES PERFORMED:  1. Right carotid endarterectomy using deep Dacron patch closure.  2. Resection of redundant right common carotid artery with primary      closure of distal and proximally ends, both procedures by Dr.      Hart Rochester on July 28, 2008.   COMPLICATIONS:  None.   CONDITION AT DISCHARGE:  Stable.   DISPOSITION:  He is being discharged home in stable condition with his  wound healing well.  He is instructed to observe his wound for signs of  infection.  He is to clean his wound with soap and warm water.  He is  cautioned about lifting of heavy objects and driving for the next couple  of weeks.  He is to have an appointment with Dr. Hart Rochester in two weeks for  followup.   BRIEF IDENTIFYING STATEMENT:  For complete details, please refer the  typed history and physical.  Briefly, this very pleasant 65 year old  gentleman was referred to Dr. Hart Rochester for carotid occlusive disease.  He  was found to have an occluded left carotid artery and a stenosis of his  right carotid artery.  Dr. Hart Rochester evaluated him and felt he should  undergo right carotid endarterectomy for stroke prevention.  He was  informed of the risks and benefits of the procedure, and after careful  consideration, he elected to proceed with surgery.   HOSPITAL COURSE:  Preoperative workup was completed as an outpatient.  He  was brought in through Same-day Surgery and underwent the  aforementioned procedure.  For complete details, please refer the typed  operative report.  The procedure was without complications.  He was  returned to the postanesthesia care unit extubated.  Following  stabilization, he was transferred to a bed in a surgical step-down unit.  He was observed overnight.  The following morning, he was desirous of  discharge.  His wound was healing well.  He was swallowing.  His tongue  was midline.  His neurological exam was nonfocal and he was felt stable  and discharged home in stable condition.      Wilmon Arms, PA      Quita Skye Hart Rochester, M.D.  Electronically Signed    KEL/MEDQ  D:  07/29/2008  T:  07/29/2008  Job:  [id number]

## 2011-05-08 NOTE — Assessment & Plan Note (Signed)
OFFICE VISIT   SEWELL, PITNER  DOB:  Dec 15, 1946                                       08/09/2009  EAVWU#:[id number]   The patient returns today 1 year post right carotid endarterectomy.  He  has a known left internal carotid occlusion and was felt to have a TIA  prior to his surgery for a moderately severe right internal carotid  stenosis.  Since his surgery he has had no neurologic symptoms such as  hemiparesis, aphasia, amaurosis fugax, diplopia, blurred vision or  syncope.  He is taking one aspirin per day.  He also has no claudication  symptoms but is bothered by his knee on the right side which needs  replacement.  Denies any chest pain, dyspnea on exertion, PND or  orthopnea.   PHYSICAL EXAMINATION:  Blood pressure is 105/50, heart rate 64,  respirations 14.  His carotid pulses are 3+ with no audible bruit  bilaterally.  Neurologic exam is normal.  Chest is clear to  auscultation.  Abdomen is soft, nontender, with no masses.  He has 3+  femoral and popliteal pulses bilaterally.  He has well-perfused lower  extremities.   Carotid duplex exam reveals occlusion of the left internal carotid which  is chronic and mild narrowing of the right internal carotid artery in  the 40-50% range.   In general I think he is doing well but we will continue to watch his  right carotid artery for evidence of restenosis.  He will return in 1  year with followup carotid duplex exam unless he develops any symptoms  in the interim.   Quita Skye Hart Rochester, M.D.  Electronically Signed   JDL/MEDQ  D:  08/09/2009  T:  08/10/2009  Job:  [id number]

## 2011-05-08 NOTE — Assessment & Plan Note (Signed)
Bobby Henry is a pleasant 65 year old gentleman who is followed in our  [facility] for multiple chronic pain complaints,  which included low back pain, bilateral knee pain, worse on the right  than on the left.  He also has had a left laparoscopy,  gastrojejunoscopy, and selective anterior and posterior vagotomies as  well as carotid endarterectomy in August 2009.   He is back in today for refill of his pain medications.  He reports his  average pain is about 5/6 on a scale of 10.  Sleep is poor.  The  patient's pain gets worse with walking and prolonged standing or  sitting.  Pain is localized to low back.  It is described as sharp and  stabbing, occasionally aching.  Right knee is the most problematic knee,  worse with walking and prolonged sitting.   FUNCTIONAL STATUS:  He is able to walk about 5 minutes at a time.  He  has difficulty with stairs.  He is able to drive.  He is independent  with self-care.  He has been disabled since 1995.   Denies changes or problems with controlling bowel or bladder.  Denies  depression, anxiety, or suicidal ideation.   He does admit to poor appetite.   No other changes in past medical, social, or family history.  Other than  that, he does have several people living in his home right now including  his mother-in-law, who is in her 80s and aunt, who is in her 79s.  He  has a stepson, who is 9 years old and has a son, who is 52 years old,  as well as his wife.  He continues to smoke about half pack of  cigarettes a day, drinks couple of cups coffee as well.   MEDICATIONS:  Prescribed through this clinic include;  1. MS Contin 15 mg 1 p.o. q.a.m.  2. Vicodin 7.5/325 one p.o. q.i.d.  3. Ambien 5 mg not more than 15 tablets per month.  4. Neurontin 300 mg 4 times a day.   PHYSICAL EXAMINATION:  VITAL SIGNS:  Today, blood pressure 154/81, pulse  63, respirations 16, and 98% saturated on room air.  GENERAL:  He is a  well-developed, well-nourished, thin adult gentleman  who appears his stated age.  He is oriented x3.  Speech is clear.  His  affect is bright.  He is alert, cooperative, and pleasant.  He follows  commands without difficulty and answers questions appropriately.  NEUROLOGIC:  Cranial nerves are grossly intact.  Coordination is grossly  intact as well.  Reflexes are 2+ at the patellar and Achilles tendon.  Decreased sensation is noted over the left thigh.  Motor strength,  however, is quite good, 5/5 at hip flexors bilaterally, knee extensors  bilaterally, dorsiflexors, and plantar flexors bilaterally.   Transitioning from sitting to standing is done without difficulty today.  Gait is slightly antalgic.  He is able to ambulate independently in the  room.  Tandem gait is performed with slight difficulty.  Romberg test is  performed adequately.   IMPRESSION:  1. Right knee pain with known severe tricompartmental osteoarthritis.      Chronic anterior cruciate ligament tear per MRI on October 11, 2008.  He plans to follow up with [facility] in the next      couple of months.  2. Lumbago status post diskectomy, Bobby Henry, November 21, 2004.  3. Status post left laparoscopy, gastrojejunoscopy, selective anterior  and posterior vagotomies, Bobby Henry.  4. Nicotine addiction.  5. Status post carotid endarterectomy, August 2009.   PLAN:  We refilled the following medications for him today, MS Contin 15  mg 1 p.o. q.a.m. #30, no refills; Ambien 5 mg 1 p.o. nightly p.r.n.  insomnia #15, no refills; and Vicodin 7.5/325 one p.o. q.i.d. #120, no  refills.  He does not need a refill on Neurontin at this time.   Bobby Henry takes his medications as prescribed.  He has not exhibited  any aberrant behavior and reports overall improvement in pain and  function with the use of the above medications.   We will see him back in a month.   Incidentally, towards the end of our  interview, he asked me to assess  his left shoulder.  Apparently, he has been having some pain in his left  shoulder region, especially when he stretches out his left arm.  Pain is  intermittent and dependent upon whether or not he stretches his arms  out.  Further assessment of the left shoulder revealed full abduction  and forward flexion of the upper extremity.  External rotation is  preserved at 80 degrees.  Internal rotation is slightly limited to about  25 degrees.  He has exquisite tenderness over the left bicipital tendon.  No tenderness is noted at the acromion.  He has no pain with rotation of  neck or radiation into the upper extremity.  He has good strength in the  shoulder and upper extremity muscles.  Reflexes are symmetric and intact  in bilateral upper extremities.   Probable bicipital tendonitis.  We will obtain a radiograph to rule out  any type of acromial spurs.  We will see him back in a month.  Recommend  icing.  Avoid nonsteroidals with history of gastric problems.  Reduce  activity in the upper extremity.  Relative rest.  Consider injection as  well as physical therapy at the next visit.           ______________________________  Bobby Henry, M.D.     DMK/MedQ  D:  12/29/2008 11:30:12  T:  12/30/2008 04:21:41  Job #:  [id number]

## 2011-05-08 NOTE — Discharge Summary (Signed)
NAME:  Bobby Henry, Bobby Henry NO.:  [id number]   MEDICAL RECORD NO.:  [id number]          PATIENT TYPE:  OBV   LOCATION:  3312                         FACILITY:  [facility]   PHYSICIAN:  Pearlean Brownie, M.D.DATE OF BIRTH:  30-Oct-1946   DATE OF ADMISSION:  07/20/2008  DATE OF DISCHARGE:  07/22/2008                               DISCHARGE SUMMARY   PRIMARY CARE PHYSICIAN:  Bobby Rumpf, MD at [facility].   DISCHARGE DIAGNOSES:  1. Altered mental status.  2. Carotid stenosis.  3. Hypertension.  4. Hyperlipidemia.   DISCHARGE MEDICATIONS:  1. Plavix 75 mg daily.  2. Norvasc 5 mg daily.  3. Crestor 10 mg at bedtime.  4. Hydrochlorothiazide 12.5 mg/enalapril 10 mg take 2 tablet daily.  5. Neurontin 300 mg 3 times a day.  6. Glucotrol 10 mg twice a day.  7. Metformin XL 750 mg daily.  8. MS Contin 30 mg twice a day.  9. Nexium 40 mg twice a day.  10.Toprol XL 100 mg take 1-1/2 tablets daily.   MEDICATIONS DISCONTINUED:  Aspirin, niacin, amitriptyline.   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  MRI/MRA head without contrast on July 21, 2008, shows occlusion of the left internal carotid artery.  No other  stenosis in the circle of Willis is identified. Carotid duplex study,  July 21, 2008, severe plaque in the entire left internal carotid artery,  right proximal internal carotid artery 60%-79% stenosis, left proximal  internal carotid artery totally occluded.  Vertebral artery flow  antegrade bilaterally.   LABORATORY DATA:  CBC on admission showed white blood count of 10.2,  hemoglobin 14.5, hematocrit 42.5, platelets 277.  CMP on July 21, 2008  showed sodium 130, potassium 3.4, chloride 94, bicarbonate 26, glucose  241, BUN 22, creatinine 1.09.  AST 20, ALT 22, alk phos 83.  Urinalysis  was negative for nitrites, leukocytes,  blood, glucose, and ketones.  Lipid profile on July 21, 2008, showed total cholesterol of 100,  triglycerides 245, HDL 25, LDL 26.  RPR on July 22, 2008 was  nonreactive.  Hemoglobin A1c was 6.9, folate 87, TSH 0.271, vitamin B12  of 569.   BRIEF HOSPITAL COURSE:  In brief, this is a 65 year old male with  hypertension, hyperlipidemia, tobacco abuse presenting to the ED with  dizziness and altered mental status.  1. Altered mental status.  The patient with several risks factors for      stroke with negative head CT was further evaluated for possible      TIA, MRI, MRA of brain and carotid Doppler showed significant      stenosis, but unlike the etiology of altered mental status, the      patient's physical exam continued to be nonfocal throughout the      hospital course with waxing and waning delirium.  Notably, the      patient had significant postoperative delirium at previous      admission and history per family  notes episodes of sundowning.  2. Stenosis.  The patient with significant bilateral carotid stenosis      found on this admission.  The patient will be discharged to refer      to a Cardiothoracic Surgery for evaluation for CEA as outpatient.  3. Hypertension.  Controlled on home meds.  4. Hyperlipidemia.  The patient on the statins.   FOLLOWUP APPOINTMENT:  Delbert Harness, MD at [facility] center on  July 29, 2008 at 1:38 p.m.   DISCHARGE CONDITION:  Stable.   ISSUES FOR FOLLOWUP:  Followup labs for reversible causes of dementia.  After discharge, the patient was found to have low TSH.      Delbert Harness, MD  Electronically Signed      Pearlean Brownie, M.D.  Electronically Signed    KB/MEDQ  D:  09/01/2008  T:  09/02/2008  Job:  [id number]

## 2011-05-08 NOTE — Assessment & Plan Note (Signed)
Mr. Gallardo is a 64 year old married gentleman who is being seen in our  pain and rehabilitative clinic for chronic low back pain and recently  slow progression of the left quadriceps weakness status post a lumbar  discectomy at the   Dictation ended at this point.           ______________________________  Brantley Stage, M.D.     DMK/MedQ  D:  04/21/2007 11:09:55  T:  04/21/2007 12:03:32  Job #:  [id number]

## 2011-05-09 ENCOUNTER — Encounter: Payer: MEDICARE | Attending: Physical Medicine and Rehabilitation | Admitting: Physical Medicine and Rehabilitation

## 2011-05-09 DIAGNOSIS — M5137 Other intervertebral disc degeneration, lumbosacral region: Secondary | ICD-10-CM | POA: Insufficient documentation

## 2011-05-09 DIAGNOSIS — Z8673 Personal history of transient ischemic attack (TIA), and cerebral infarction without residual deficits: Secondary | ICD-10-CM | POA: Insufficient documentation

## 2011-05-09 DIAGNOSIS — K219 Gastro-esophageal reflux disease without esophagitis: Secondary | ICD-10-CM | POA: Insufficient documentation

## 2011-05-09 DIAGNOSIS — M25539 Pain in unspecified wrist: Secondary | ICD-10-CM | POA: Insufficient documentation

## 2011-05-09 DIAGNOSIS — I779 Disorder of arteries and arterioles, unspecified: Secondary | ICD-10-CM | POA: Insufficient documentation

## 2011-05-09 DIAGNOSIS — M47817 Spondylosis without myelopathy or radiculopathy, lumbosacral region: Secondary | ICD-10-CM | POA: Insufficient documentation

## 2011-05-09 DIAGNOSIS — M545 Low back pain, unspecified: Secondary | ICD-10-CM

## 2011-05-09 DIAGNOSIS — Z79899 Other long term (current) drug therapy: Secondary | ICD-10-CM | POA: Insufficient documentation

## 2011-05-09 DIAGNOSIS — M79609 Pain in unspecified limb: Secondary | ICD-10-CM

## 2011-05-09 DIAGNOSIS — M25569 Pain in unspecified knee: Secondary | ICD-10-CM | POA: Insufficient documentation

## 2011-05-09 DIAGNOSIS — I10 Essential (primary) hypertension: Secondary | ICD-10-CM | POA: Insufficient documentation

## 2011-05-09 DIAGNOSIS — G894 Chronic pain syndrome: Secondary | ICD-10-CM

## 2011-05-09 DIAGNOSIS — M159 Polyosteoarthritis, unspecified: Secondary | ICD-10-CM | POA: Insufficient documentation

## 2011-05-09 DIAGNOSIS — G8929 Other chronic pain: Secondary | ICD-10-CM | POA: Insufficient documentation

## 2011-05-09 DIAGNOSIS — M51379 Other intervertebral disc degeneration, lumbosacral region without mention of lumbar back pain or lower extremity pain: Secondary | ICD-10-CM | POA: Insufficient documentation

## 2011-05-09 DIAGNOSIS — M1710 Unilateral primary osteoarthritis, unspecified knee: Secondary | ICD-10-CM | POA: Insufficient documentation

## 2011-05-09 DIAGNOSIS — E119 Type 2 diabetes mellitus without complications: Secondary | ICD-10-CM | POA: Insufficient documentation

## 2011-05-09 DIAGNOSIS — F17200 Nicotine dependence, unspecified, uncomplicated: Secondary | ICD-10-CM | POA: Insufficient documentation

## 2011-05-09 NOTE — Assessment & Plan Note (Signed)
[name] is a 65 year old married gentleman who is followed in our center for pain and rehabilitative medicine for multiple chronic pain complaints.  Chief complaint is typically related to his low back and his right knee.  He has a history of tricompartmental osteoarthritis of his right knee and intermittent neuropathic pain from lumbar spondylosis in both lower extremities.  He is status post spine surgery.  He underwent left L2-3 microdiskectomy by Dr. Danielle Dess on November 21, 2004.  He has had chronic residual left leg numbness since then.  He is back in today requesting refill of his pain medications.  His average pain is about 5 or 6 on a scale of 10 interfering somewhat with activity levels.  His right knee gives out occasionally.  He does use a cane indoors as well as outdoors.  His pain is described as sharp, stabbing, and aching.  Sleep tends to be poor.  Pain is worse with activity such as walking, standing, prolonged sitting also bothers him. He gets fair relief with current meds.  Medications prescribed through center for pain include Vicodin 7.5/325 up to 4 times a day, MS Contin 50 mg in the morning, Ambien not more than 15 times each month, gabapentin 300 mg one p.o. q.7 a.m., 1 p.m., 6 p.m., 3 a.m., and two at 11 p.m.  FUNCTIONAL STATUS:  He can walk about 5 minutes at a time.  He does not climb stairs.  He is able to drive.  He is independent with self-care.  REVIEW OF SYSTEMS:  Negative for problems controlling bowel or bladder. Denies depression, anxiety, or suicidal ideation.  Maintains contact with Dr. Sharen Hones, his primary care physician.  Past medical, social, family history are otherwise unchanged.  He continues to smoke quarter pack of cigarettes a day, and I have advised him to discontinue.  PHYSICAL EXAMINATION:  VITAL SIGNS:  Blood pressure is 142/72, pulse 62, respiration 18, 96% saturated on room air.  GENERAL:  He is a well- developed,  well-nourished thin gentleman who does not appear in any distress who is oriented x3.  Speech is clear.  Affect is bright.  He is alert, cooperative, pleasant.  Follows commands without difficulty, answers my questions appropriately. NEUROLOGICAL:  Cranial nerves and coordination are grossly intact. MUSCULOSKELETAL:  His reflexes are 0 at the right knee, 2+ at the left knee and 2+ at the right Achilles tendon diminished at the left Achilles tendon.  No abnormal tone, clonus, or tremors are noted.  He has patchy decreased sensation especially over the left anterior and lateral thigh.  His right knee is without effusion today.  He does have some joint line tenderness especially medially.  He has some laxity of the right lateral collateral ligament.  He has limitations in lumbar motion in all planes.  Tandem gait and Romberg test are performed adequately.  He has a stable gait otherwise.  IMPRESSION: 1. Lumbar degenerative disk disease status post diskectomy at L2-3,     Dr. Danielle Dess, on November 21, 2004. 2. Degenerative joint disease of sacroiliac joint per radiographs. 3. Left, acromioclavicular osteoarthritis. 4. Right knee osteoarthritis, tricompartmental.  The patient declines     injection. 5. Chronic left wrist pain with degenerative changes at the left wrist     joint, consider checking for gout.  The patient has declined blood     work. 6. Chronic opioid use.  PLAN:  His past medical history is significant for carotid artery disease.  He has had a history of TIA  and status post carotid endarterectomy in 2009 followed by Dr. Hart Rochester.  Other medical problems include a history of esophageal reflux, hypertension, diabetes, nicotine addiction.  PLAN:  Encouraged him to stay active.  We have discussed consideration of discontinuing MS Contin he is reluctant.  He is not interested in any kind of knee injection at this time.  He would like to follow up with orthopedics at sometime  in the future but his wife is currently going to have some major surgery, and he would like to hold off on any kind of procedures himself at this point.  We will continue to monitor his pill counts.  He will be seen at the next visit in 3 weeks.  I will like to have pill counts assessed as well as random urine drug screen done at that time as well.  I will see him back in 2 months.  He is comfortable with our plan.     Brantley Stage, M.D. Electronically Signed    DMK/MedQ D:  05/09/2011 11:07:47  T:  05/09/2011 22:40:34  Job #:  [id number]

## 2011-05-10 ENCOUNTER — Telehealth: Payer: Self-pay | Admitting: *Deleted

## 2011-05-10 NOTE — Telephone Encounter (Signed)
Pt is scheduled for jury duty on June 4 and he is asking for a letter to excuse him.  Please call when ready.

## 2011-05-11 NOTE — Telephone Encounter (Signed)
He said because of his back pain. It prevents him from sitting for long periods of time.

## 2011-05-11 NOTE — Group Therapy Note (Signed)
BRIEF HISTORY:  Bobby Henry is in for recheck today.  He was initially seen  on December 08, 2003.  He is a 65 year old gentleman who has a long history  of low back pain dating back to the early 1990s.  His history includes the  following:  (1) Status post cervical spine surgery in the 1980s, (2) status  post lumbar surgery in the 1990s, (3) status post three knee surgeries in  the 1990s, (4) chronic low back pain, (5) degenerative disk disease.   Patient had been on OxyContin 20 mg once daily initially when he was seen at  his initial visit he was switched to a twice-a-day regime, he takes  OxyContin 20 mg one p.o. b.i.d.  He reports overall he feels much better.  He is walking approximately 30 minutes a day.  He gets up at 6 a.m. and  helps his twins who are 65 years old get ready for school.  He does spend a  good amount of time watching television and playing video games  approximately 2-3 hours.  Overall his mood is good.  His pain is in the 4/5  range, at the first visit he reported his pain scores to be between 6 and 9,  he is quite pleased with the improvement in his pain management.  He denies  any problems with his mood.  He reports he would not harm himself or others.  He has no significant side effects from the OxyContin and he denies any  constipation or any somnolence.  Bowel and bladder are working fine; no  problems.  No new numbness, tingling, weakness, no gait problems or  clumsiness.   PHYSICAL EXAMINATION:  His blood pressure is 131/64, pulse 84, pulse  oximetry is 99% saturated on room air.  He is alert, appropriate,  cooperative with a bright mood.  His skin is unremarkable.  HEENT  unremarkable.  He is able to get off of the exam table easily.  His gait is  normal in the room with good balance.  He is able to rise on his toes.  He  is able to flex forward to approximately 50-60%.  His extension is limited  as well as his lateral flexion is limited.  Seated reflexes  are 2+ at the  knees, 2+ at the right ankle, 0 at the left ankle.  Toes are downgoing.  No  clonus is noted.  He has some patchy areas of decreased sensation in the  left lower extremity.  Right lower extremity is intact to sensory exam.  Motor strength is 5/5 in both lower extremities including hip flexors, knee  extensors, dorsiflexors, plantar flexors, EHL and evertors.  Straight leg  raise is negative.   IMPRESSION:  1. Chronic low back pain.  2. Degenerative disk disease.   PLAN:  Patient overall is doing well, has no major complaint problems, is  tolerating his OxyContin well.  He reports no problems with the  amitriptyline, he only takes it infrequently and has been doing well on it,  denies any problems.  He continues to do his walking daily about 30 minutes  a day.  Overall patient stable while taking OxyContin and amitriptyline for  chronic low back pain.  We will see patient in approximately 1 month.     Brantley Stage, M.D.   DMK/MedQ  D:  02/09/2004 10:49:07  T:  02/09/2004 13:02:18  Job #:  [id number]

## 2011-05-11 NOTE — Telephone Encounter (Signed)
Plz call pt to see why he can't serve for jury duty?  Inability to be seated long periods?  Does need to keep legs elevated as well. Thanks.

## 2011-05-12 NOTE — Telephone Encounter (Signed)
Notify letter ready (placed in chart).  However if they require functionality assessment for excusal, he will need to ask pain management to provide letter.

## 2011-05-14 NOTE — Telephone Encounter (Signed)
Patient notified. Letter placed up front for patient to pick up.

## 2011-05-30 ENCOUNTER — Encounter: Payer: Medicare Other | Attending: Physical Medicine and Rehabilitation | Admitting: Neurosurgery

## 2011-05-30 DIAGNOSIS — M25569 Pain in unspecified knee: Secondary | ICD-10-CM | POA: Insufficient documentation

## 2011-05-30 DIAGNOSIS — M545 Low back pain, unspecified: Secondary | ICD-10-CM | POA: Insufficient documentation

## 2011-05-30 DIAGNOSIS — M1710 Unilateral primary osteoarthritis, unspecified knee: Secondary | ICD-10-CM | POA: Insufficient documentation

## 2011-05-30 DIAGNOSIS — Z79899 Other long term (current) drug therapy: Secondary | ICD-10-CM | POA: Insufficient documentation

## 2011-05-30 DIAGNOSIS — E119 Type 2 diabetes mellitus without complications: Secondary | ICD-10-CM | POA: Insufficient documentation

## 2011-05-30 DIAGNOSIS — K219 Gastro-esophageal reflux disease without esophagitis: Secondary | ICD-10-CM | POA: Insufficient documentation

## 2011-05-30 DIAGNOSIS — M79609 Pain in unspecified limb: Secondary | ICD-10-CM | POA: Insufficient documentation

## 2011-05-30 DIAGNOSIS — I10 Essential (primary) hypertension: Secondary | ICD-10-CM | POA: Insufficient documentation

## 2011-05-30 DIAGNOSIS — M47817 Spondylosis without myelopathy or radiculopathy, lumbosacral region: Secondary | ICD-10-CM | POA: Insufficient documentation

## 2011-05-31 NOTE — Assessment & Plan Note (Signed)
Bobby Henry follows up here for pain in his low back and lower extremities.  He is followed by Dr. Pamelia Hoit normally.  He has osteoarthritis of his right knee and is in the holding pattern for an arthroplasty in that knee, but his wife has back surgery scheduled with Dr. Tressie Stalker at [facility] in a couple of weeks and he will not show until she is over that.  He rates his pain about 5 or 6, sharp, burning and aching.  General activity level is about 6-7. Pain is worse during the day and night. Sleep patterns are poor.  All activities aggravate. Medication tends to help.  REVIEW OF SYSTEMS:  Notable for some blood sugar fluctuations, otherwise within normal limits.  He does use a cane from time to time.  He drives but he does not climb steps.  He can walk about 15 minutes at a time.  PAST MEDICAL HISTORY:  Significant for stroke, hypertension, stomach problems, diabetes.  SOCIAL HISTORY:  He is married, lives with his wife.  FAMILY HISTORY:  Significant for hypertension.  PHYSICAL EXAMINATION:  His blood pressure is 140/75, his pulse 63, his respirations 18, O2 saturations 98 on room air.  Motor strength is 5/5 in lower extremities.  Sensation is intact.  He has major crepitus in his right knee with passive range of motion.  Constitutionally, he is within normal limits.  He is alert and oriented x3.  His affect is bright.  He does walk with a cane.  IMPRESSION: 1. Lumbago. 2. Right knee osteoarthritis pending arthroplasty. 3. Status post laminectomy syndrome, Dr. Danielle Dess.  PLAN: 1. We will go ahead and refill his Vicodin 7.5/325 one p.o. q.i.d.,     #120, no refill. 2. MS Contin 15 mg one p.o. q.a.m., #30 with no refill. 3. He will follow up with Dr. Pamelia Hoit in 2 weeks.  He states he     already has an appointment.  I will see him back in this clinic     after that.     Roza Creamer L. Blima Dessert Electronically Signed    RLW/MedQ D:  05/30/2011  14:58:31  T:  05/31/2011 06:20:59  Job #:  [id number]

## 2011-06-15 ENCOUNTER — Ambulatory Visit
Admission: RE | Admit: 2011-06-15 | Discharge: 2011-06-15 | Disposition: A | Discharge status: Home / Self Care | Payer: Medicare Other | Source: Ambulatory Visit | Attending: Cardiovascular Disease | Admitting: Cardiovascular Disease

## 2011-06-15 ENCOUNTER — Other Ambulatory Visit: Payer: Self-pay | Admitting: Cardiovascular Disease

## 2011-06-15 DIAGNOSIS — Z01811 Encounter for preprocedural respiratory examination: Secondary | ICD-10-CM

## 2011-07-04 ENCOUNTER — Ambulatory Visit: Payer: MEDICARE | Admitting: Physical Medicine and Rehabilitation

## 2011-07-06 ENCOUNTER — Encounter: Payer: Medicare Other | Attending: Physical Medicine and Rehabilitation | Admitting: Neurosurgery

## 2011-07-06 DIAGNOSIS — M545 Low back pain, unspecified: Secondary | ICD-10-CM | POA: Insufficient documentation

## 2011-07-06 DIAGNOSIS — K219 Gastro-esophageal reflux disease without esophagitis: Secondary | ICD-10-CM | POA: Insufficient documentation

## 2011-07-06 DIAGNOSIS — M25569 Pain in unspecified knee: Secondary | ICD-10-CM | POA: Insufficient documentation

## 2011-07-06 DIAGNOSIS — I10 Essential (primary) hypertension: Secondary | ICD-10-CM | POA: Insufficient documentation

## 2011-07-06 DIAGNOSIS — M25529 Pain in unspecified elbow: Secondary | ICD-10-CM

## 2011-07-06 DIAGNOSIS — E119 Type 2 diabetes mellitus without complications: Secondary | ICD-10-CM | POA: Insufficient documentation

## 2011-07-06 DIAGNOSIS — M1710 Unilateral primary osteoarthritis, unspecified knee: Secondary | ICD-10-CM | POA: Insufficient documentation

## 2011-07-06 DIAGNOSIS — Z79899 Other long term (current) drug therapy: Secondary | ICD-10-CM | POA: Insufficient documentation

## 2011-07-06 DIAGNOSIS — M79609 Pain in unspecified limb: Secondary | ICD-10-CM | POA: Insufficient documentation

## 2011-07-06 DIAGNOSIS — M47817 Spondylosis without myelopathy or radiculopathy, lumbosacral region: Secondary | ICD-10-CM | POA: Insufficient documentation

## 2011-07-07 ENCOUNTER — Ambulatory Visit (HOSPITAL_COMMUNITY)
Admission: RE | Admit: 2011-07-07 | Discharge: 2011-07-07 | Disposition: A | Discharge status: Home / Self Care | Payer: Medicare Other | Source: Ambulatory Visit | Attending: Physical Medicine and Rehabilitation | Admitting: Physical Medicine and Rehabilitation

## 2011-07-07 ENCOUNTER — Ambulatory Visit (HOSPITAL_COMMUNITY)
Admission: EM | Admit: 2011-07-07 | Discharge: 2011-07-07 | Disposition: A | Discharge status: Home / Self Care | Payer: Medicare Other | Source: Ambulatory Visit | Attending: Physical Medicine and Rehabilitation | Admitting: Physical Medicine and Rehabilitation

## 2011-07-07 ENCOUNTER — Other Ambulatory Visit: Payer: Self-pay | Admitting: Physical Medicine and Rehabilitation

## 2011-07-07 DIAGNOSIS — M25519 Pain in unspecified shoulder: Secondary | ICD-10-CM | POA: Insufficient documentation

## 2011-07-07 DIAGNOSIS — M19019 Primary osteoarthritis, unspecified shoulder: Secondary | ICD-10-CM | POA: Insufficient documentation

## 2011-07-07 DIAGNOSIS — R208 Other disturbances of skin sensation: Secondary | ICD-10-CM

## 2011-07-07 NOTE — Assessment & Plan Note (Signed)
Account [id number]  This is a patient of Dr. Pamelia Hoit followed for back pain and right shoulder pain.  He has had surgery in the past with Dr. Lovell Sheehan __________.  He today rates his pain at about an 8.  It is a sharp pain. General activity level is an 8.  Sleep patterns are poor.  Pain is worse during the day and at night.  All activities aggravate.  Medication helps about level I.  He does use a cane from time to time.  He does have a slight limp.  He does not climb steps. He can drive.  He cannot walk for about 5 minutes at a time.  He is on disability.  REVIEW OF SYSTEMS:  Notable for the difficulties described above, otherwise within normal limits.  No signs of aberrant behavior or suicidal thoughts.  His Oswestry score is 78.  PAST MEDICAL HISTORY:  Unchanged.  SOCIAL HISTORY:  He is married and lives with his wife.  FAMILY HISTORY:  Unchanged.  PHYSICAL EXAMINATION:  VITAL SIGNS:  Physical exam his blood pressure 160/85, pulse 65, respiration 18, O2 sats 100 on room air.  His motor strength in lower extremities are 5/5.  His sensation is intact in upper lower extremities.  He does have significant pain today in his right shoulder.  He states he has taken a fall on some wet pavement or flooring and laded on his right elbow and ever since that time, he has had excruciating right shoulder pain.  His range of motion is quite limited.  He cannot pronate his arm without pain.  He cannot reach behind his strength and range of motion are quite limited with passive range of motion.  There is a clicking sensation in the shoulder on the right side.  IMPRESSION: 1. Acute on chronic right shoulder pain. 2. Osteoarthritis of the knees. 3. Post laminectomy syndrome.  PLAN: 1. Refilled his Vicodin #1 7.5/325 one p.o. q.i.d. 120 with no refill. 2. MS Contin 15 mg one p.o. q.a.m., 30 with no refill. 3. Ambien 5 mg one p.o. nightly, 15 with no refill. 4. After informed consent, I injected  his right shoulder with 2 mL of     lidocaine and 1 mL/40 mg of Depo-Medrol he tolerated it well.  He     knows to ice the shoulder tonight and avoiding exercise or     strenuous his activity with that arm.  We are going to obtain an     MRI of the right shoulder without gadolinium, bring him back for     review of that.  He is in agreement with this.  His questions were     encouraged and answered.  We will see him back in 2 to 3 weeks     after his MRI.     [name] Electronically Signed    RLW/MedQ D:  07/06/2011 15:09:42  T:  07/07/2011 02:20:30  Job #:  [id number]

## 2011-08-06 ENCOUNTER — Encounter
Payer: Medicare Other | Attending: Physical Medicine and Rehabilitation | Admitting: Physical Medicine and Rehabilitation

## 2011-08-06 DIAGNOSIS — M19029 Primary osteoarthritis, unspecified elbow: Secondary | ICD-10-CM

## 2011-08-06 DIAGNOSIS — M25519 Pain in unspecified shoulder: Secondary | ICD-10-CM | POA: Insufficient documentation

## 2011-08-06 DIAGNOSIS — G894 Chronic pain syndrome: Secondary | ICD-10-CM

## 2011-08-06 DIAGNOSIS — M25569 Pain in unspecified knee: Secondary | ICD-10-CM | POA: Insufficient documentation

## 2011-08-06 DIAGNOSIS — G8929 Other chronic pain: Secondary | ICD-10-CM | POA: Insufficient documentation

## 2011-08-06 DIAGNOSIS — F17200 Nicotine dependence, unspecified, uncomplicated: Secondary | ICD-10-CM | POA: Insufficient documentation

## 2011-08-06 DIAGNOSIS — M545 Low back pain, unspecified: Secondary | ICD-10-CM | POA: Insufficient documentation

## 2011-08-06 DIAGNOSIS — M1710 Unilateral primary osteoarthritis, unspecified knee: Secondary | ICD-10-CM

## 2011-08-06 DIAGNOSIS — Z79899 Other long term (current) drug therapy: Secondary | ICD-10-CM | POA: Insufficient documentation

## 2011-08-06 NOTE — Assessment & Plan Note (Signed)
[name] is a pleasant married 65 year old gentleman who is followed at [facility] for chronic pain complaints predominately related to his low back, his left knee, and more recently right shoulder pain.  He sustained an injury to the right shoulder after a fall, underwent injection by nurse practitioner, Lauree Chandler, last month.  He had a followup appointment with orthopedist, Dr. Valma Cava physician's assistant and again underwent 2 more injections last week.  While he was at [facility], his left knee was also assessed apparently and was told to consider surgical option for the left knee pain.  Prior to considering surgery, however, [name] does need to follow up with his cardiologist for further workup apparently.  He reports after the last injection, his right shoulder is doing much better.  He has improved range of motion and decreased pain.  He has a therapeutic exercise band which he is starting to use now.  His average pain overall is about 7 to 8 on a scale of 10.  Sleep tends to be poor. His pain interferes significantly with activity levels.  Pain is typically worse with most activities, improves a little bit with medication.  Medications that are prescribed through [facility] include the following, gabapentin 300 mg he takes 4 times a day, MS Contin 15 mg once a day, Vicodin 7.5/325 four times a day and Ambien 5 mg not more than 15 times per month.  Functional status:  He can walk about 10 minutes at a time.  He does drive.  He is independent with self-care.  REVIEW OF SYSTEMS:  He notes trouble walking, otherwise negative.  No other changes in past medical, social, or family history.  He does continue to smoke about a quarter of a pack of cigarettes.  I have cautioned him against this.  PHYSICAL EXAMINATION:  VITAL SIGNS:  On exam today, his blood pressure is 146/75,  pulse 75, respirations 16, 98% saturated on room air. GENERAL:  He is a well-developed, well-nourished thin gentleman who does not appear in any distress. NEUROLOGICAL:  He is oriented x3.  Speech is clear.  Affect is bright. He is alert, cooperative, and pleasant.  Follows commands without difficulty answers my questions appropriately.  Cranial nerves coordination are intact. EXTREMITIES:  His reflexes are 2+ in the upper extremities at biceps, triceps, brachioradialis 2+ at patellar tendons bilaterally, 1+ at the right Achilles tendon, 0 at the left Achilles tendon.  He has no abnormal tone, clonus, or tremors noted.  His motor strength is good in both upper extremities without obvious focal deficit, lower extremity 5/5 without obvious deficit.  He transitions easily from sitting to standing.  His gait is stable.  He does have some trouble with tandem gait due to the left knee pain. Romberg test is performed adequately.  He has some limitations of cervical range of motion with rotation to the left.  He has full shoulder range of motion today with minimal pain in the right shoulder.  He has external rotation of about at 80 degrees on the right, internal rotation is significantly limited essentially 0.  Left knee continues to be tender along the joint line.  Mild swelling noted as well.  He has some limitations in lumbar motion in all planes as well.  IMPRESSION: 1. Lumbago status post L2-3 microdiskectomy Dr. Danielle Dess on November 21, 2004. 2. Chronic left knee pain with known tricompartmental degenerative  changes, recently seen by [facility] and surgical     consideration is being pursued. 3. Right shoulder pain most likely multifactorial.  He has per x-ray     acromioclavicular joint arthritis.  He may have some glenohumeral     involvement as well.  He has done well with the last 3 injections.  PLAN:  I have encouraged him to continue using his Thera-Band  for right shoulder exercises.  He will continue to use a cane, especially for uneven surfaces.  He is tolerating his pain medications and reports some improvement with their use.  His last urine drug screen was January 31, 2011, and was consistent.  He is short on his medications today.  He does understand, he needs to take his medications as prescribed.  I have rewritten his medications for him today.  I have answered all his questions.  His medical problems are numerous and include history of carotid endarterectomy, nicotine addiction.  I have cautioned him against continued smoking.  He has a history of esophageal reflux, hypertension, diabetes.  He was following up with his cardiologist for a possible catheterization as well, and he is planning to follow up with [facility] for possible surgical intervention on his left knee.  He is currently comfortable with our treatment plan.  He will see the nurse practitioner over the next 2 months.  We will continue to monitor pill counts as well as random urine drug screen.     Brantley Stage, M.D. Electronically Signed    DMK/MedQ D:  08/06/2011 11:39:29  T:  08/06/2011 19:41:29  Job #:  [id number]

## 2011-08-09 ENCOUNTER — Other Ambulatory Visit: Payer: Self-pay | Admitting: Family Medicine

## 2011-08-10 ENCOUNTER — Ambulatory Visit: Payer: Self-pay | Admitting: Physical Medicine and Rehabilitation

## 2011-08-21 ENCOUNTER — Other Ambulatory Visit: Payer: Self-pay | Admitting: *Deleted

## 2011-08-21 MED ORDER — ENALAPRIL-HYDROCHLOROTHIAZIDE 10-25 MG PO TABS: 2.0000 | ORAL_TABLET | Freq: Every day | ORAL | Status: DC

## 2011-08-22 ENCOUNTER — Other Ambulatory Visit: Payer: Self-pay | Admitting: Family Medicine

## 2011-08-30 ENCOUNTER — Other Ambulatory Visit (INDEPENDENT_AMBULATORY_CARE_PROVIDER_SITE_OTHER): Payer: Medicare Other | Admitting: *Deleted

## 2011-08-30 ENCOUNTER — Ambulatory Visit: Payer: Self-pay

## 2011-08-30 DIAGNOSIS — I6529 Occlusion and stenosis of unspecified carotid artery: Secondary | ICD-10-CM

## 2011-09-03 ENCOUNTER — Ambulatory Visit (HOSPITAL_COMMUNITY)
Admission: RE | Admit: 2011-09-03 | Discharge: 2011-09-03 | Disposition: A | Discharge status: Home / Self Care | Payer: Medicare Other | Source: Ambulatory Visit | Attending: Cardiovascular Disease | Admitting: Cardiovascular Disease

## 2011-09-03 DIAGNOSIS — I209 Angina pectoris, unspecified: Secondary | ICD-10-CM | POA: Insufficient documentation

## 2011-09-03 DIAGNOSIS — I2510 Atherosclerotic heart disease of native coronary artery without angina pectoris: Secondary | ICD-10-CM | POA: Insufficient documentation

## 2011-09-03 LAB — BASIC METABOLIC PANEL
CO2: 28 mEq/L (ref 19–32)
Calcium: 9.4 mg/dL (ref 8.4–10.5)
Creatinine, Ser: 0.83 mg/dL (ref 0.50–1.35)
Glucose, Bld: 195 mg/dL — ABNORMAL HIGH (ref 70–99)

## 2011-09-04 ENCOUNTER — Encounter: Payer: Self-pay | Admitting: Vascular Surgery

## 2011-09-04 NOTE — Procedures (Unsigned)
CAROTID DUPLEX EXAM  INDICATION:  Right carotid endarterectomy.  HISTORY: Diabetes:  Yes. Cardiac:  No. Hypertension:  Yes. Smoking:  Yes. Previous Surgery:  Right carotid endarterectomy on 07/28/2008. CV History:  TIA before surgery. Amaurosis Fugax No, Paresthesias No, Hemiparesis No                                      RIGHT             LEFT Brachial systolic pressure:         132               128 Brachial Doppler waveforms:         Normal            Normal Vertebral direction of flow:        Antegrade DUPLEX VELOCITIES (cm/sec) CCA peak systolic                   92 ECA peak systolic                   108 ICA peak systolic                   126 ICA end diastolic                   41 PLAQUE MORPHOLOGY:                  Heterogeneous PLAQUE AMOUNT:                      Mild PLAQUE LOCATION:                    CCA  IMPRESSION: 1. Patent right carotid endarterectomy site with elevated velocities     suggestive of 40%-59% stenosis noted in the right proximal internal     carotid artery, however, this increase in velocity is most likely     due to compensatory flow from contralateral internal carotid artery     occlusion. 2. No significant change noted when compared to the previous exam on     09/05/2010.  ___________________________________________ Quita Skye. Hart Rochester, M.D.  CH/MEDQ  D:  08/31/2011  T:  08/31/2011  Job:  [id number]

## 2011-09-06 ENCOUNTER — Other Ambulatory Visit: Payer: Self-pay

## 2011-09-06 DIAGNOSIS — I6529 Occlusion and stenosis of unspecified carotid artery: Secondary | ICD-10-CM

## 2011-09-06 DIAGNOSIS — Z48812 Encounter for surgical aftercare following surgery on the circulatory system: Secondary | ICD-10-CM

## 2011-09-07 ENCOUNTER — Encounter: Payer: Medicare Other | Attending: Physical Medicine and Rehabilitation | Admitting: Neurosurgery

## 2011-09-07 DIAGNOSIS — Z79899 Other long term (current) drug therapy: Secondary | ICD-10-CM | POA: Insufficient documentation

## 2011-09-07 DIAGNOSIS — E119 Type 2 diabetes mellitus without complications: Secondary | ICD-10-CM | POA: Insufficient documentation

## 2011-09-07 DIAGNOSIS — M47817 Spondylosis without myelopathy or radiculopathy, lumbosacral region: Secondary | ICD-10-CM | POA: Insufficient documentation

## 2011-09-07 DIAGNOSIS — M25569 Pain in unspecified knee: Secondary | ICD-10-CM

## 2011-09-07 DIAGNOSIS — M1710 Unilateral primary osteoarthritis, unspecified knee: Secondary | ICD-10-CM | POA: Insufficient documentation

## 2011-09-07 DIAGNOSIS — M961 Postlaminectomy syndrome, not elsewhere classified: Secondary | ICD-10-CM

## 2011-09-07 DIAGNOSIS — I10 Essential (primary) hypertension: Secondary | ICD-10-CM | POA: Insufficient documentation

## 2011-09-07 DIAGNOSIS — M79609 Pain in unspecified limb: Secondary | ICD-10-CM | POA: Insufficient documentation

## 2011-09-07 DIAGNOSIS — K219 Gastro-esophageal reflux disease without esophagitis: Secondary | ICD-10-CM | POA: Insufficient documentation

## 2011-09-07 DIAGNOSIS — M545 Low back pain, unspecified: Secondary | ICD-10-CM | POA: Insufficient documentation

## 2011-09-08 NOTE — Assessment & Plan Note (Signed)
Account [id number].  This is a patient of Dr. Pamelia Hoit that is seen for multiple pain complaints mostly low back, right knee, and shoulder.  The patient states that Dr. Thomasena Edis, [facility] is in the process of setting up a total knee replacement for him on the right.  He has not got that scheduled yet.  He rates his average pain at 8 to 9.  It is a sharp burning-type pain.  General activity level is 7-8.  The pain is same 24 hours a day.  Sleep patterns are poor.  Pain is worse with walking, bending, sitting, standing.  Medications help some.  He uses a cane to ambulate.  He does not climb steps.  He can drive.  He can only walk about 5 minutes at a time.  He is on disability.  REVIEW OF SYSTEMS:  Notable for difficulties described above, otherwise within normal limits.  His Oswestry score is 82.  The patient tells me that after looking at his pill count chart, his counts were 0 today.  He was last filled on August 06, 2011, for his Vicodin and MS Contin.  I asked the patient how long he has been out and he admitted that he has been taken more of both than he should and he could not exactly remember when he ran out.  The patient was verbally warned that he will be discharged from the clinic if this happens again.  I told him if he takes any more than prescribed of either medicine, he will be discharged and he stated understanding.  He was given one prescriptions for each medicine today but will not receive more if he comes in with another short pill count.  His past medical history unchanged.  SOCIAL HISTORY:  He is married, lives with wife.  FAMILY HISTORY:  Unchanged.  PHYSICAL EXAMINATION:  VITAL SIGNS:  His blood pressure is 138/76, pulse 62, respirations 16, O2 sat is 98 on room air.  Constitutionally, he is within normal limits.  He is alert and oriented x3.  Sensation is intact.  He gives way right lower extremity to confrontation testing due to pain.  Otherwise  he is within normal limits in the upper and lower extremities.  ASSESSMENT: 1. Lumbago.  Post laminectomy syndrome. 2. History of right shoulder pain. 3. Osteoarthritis of the right knee with pinning arthroplasty with Dr.     Thomasena Edis.  PLAN: 1. Refill Vicodin 7.5/325 one p.o. q.i.d. 120 with no refill. 2. MS Contin 15 mg one p.o. q.a.m. 30 with no refill.  The patient will follow up here in 1 month again he was warned that if he is more than 1 day short on either one of his prescriptions he will be discharged and he stated understanding.     Mindi Akerson L. Blima Dessert Electronically Signed   RLW/MedQ D:  09/07/2011 15:32:50  T:  09/08/2011 00:34:58  Job #:  [id number]

## 2011-09-10 ENCOUNTER — Other Ambulatory Visit: Payer: Self-pay | Admitting: *Deleted

## 2011-09-10 NOTE — Telephone Encounter (Signed)
I questioned if he has had labs/OV recently.

## 2011-09-11 MED ORDER — METFORMIN HCL ER 750 MG PO TB24: 750.0000 mg | ORAL_TABLET | Freq: Every day | ORAL | Status: DC

## 2011-09-11 NOTE — Telephone Encounter (Signed)
Fu due

## 2011-09-12 LAB — CBC
HCT: 43.4
Hemoglobin: 14.5
MCV: 90.5
RBC: 4.8
WBC: 11.1 — ABNORMAL HIGH

## 2011-09-12 LAB — DIFFERENTIAL
Basophils Absolute: 0
Eosinophils Relative: 1
Lymphocytes Relative: 15

## 2011-09-12 LAB — LIPASE, BLOOD: Lipase: 27

## 2011-09-12 LAB — COMPREHENSIVE METABOLIC PANEL
AST: 17
BUN: 12
CO2: 35 — ABNORMAL HIGH
Chloride: 94 — ABNORMAL LOW
Creatinine, Ser: 1.09
GFR calc non Af Amer: >60
Total Bilirubin: 0.1 — ABNORMAL LOW

## 2011-09-12 NOTE — Cardiovascular Report (Signed)
NAME:  Bobby Henry, Bobby Henry NO.:  [id number]  MEDICAL RECORD NO.:  [id number]  LOCATION:  [facility]                         FACILITY:  [facility]  PHYSICIAN:  Thurmon Fair, MD     DATE OF BIRTH:  [birth year]  DATE OF PROCEDURE:  09/03/2011 DATE OF DISCHARGE:                           CARDIAC CATHETERIZATION   REASON FOR PROCEDURE:  Atypical angina and abnormal myocardial perfusion study.  PROCEDURES PERFORMED: 1. Left heart catheterization 2. Left ventriculography. 3. Selective coronary angiography  Bobby Henry is a 65 year old man with atypical chest discomfort in a previous nuclear stress test showing evidence of mixture of scar and ischemia in the inferior wall.  He has never previously undergone coronary angiography.  After risks and benefits of the procedure were described, the patient provided informed consent and was brought to the cardiac cath lab in a fasting state.  He was prepped and draped in usual sterile fashion. Local anesthesia with 1% lidocaine was administered to the right groin area and using the modified Seldinger technique, a 5-French right common femoral artery sheath was introduced.  Under fluoroscopic guidance and using 6-French JL-4 catheter, selective cannulation and multiple angiograms of the left coronary artery were performed in a variety of projections.  A 5-French JR catheter and a 5-French no-torque catheter was then employed to attempt cannulation of the right coronary artery. The ostium of the right coronary could not be located.  The vessel was felt to be occluded at the level of its ostium.  The distal right coronary artery fills in a retrograde fashion via left-to-right collaterals and retrograde flow can be seen all the way to the most proximal portion of the right coronary artery including what appears to be the infundibular branch.  A 5-French pigtail catheter was then used across the aortic valve to record intracardiac  pressures, perform a left ventriculogram in the RAO projection and a pullback across the aortic valve.  At the end of the procedure, all catheters were removed and sheath will be removed with manual pressure.  No immediate complications occurred.  FINDINGS: 1. The left main coronary artery is a relatively short vessel that     appears to have an eccentric 40% stenosis at its distal point     extending into the proximal left circumflex coronary artery. 2. Left anterior descending artery has mild diffuse disease in its     proximal third.  The vessel was quite calcified in this area.     There is no flow-limiting lesions in the maximum diagnosed stenosis     is probably less than 30%.  The LAD artery generates 2 major     diagonal arteries of which the second is much larger.  Large left-     to-right collaterals are seen filling the right coronary artery     during injection of the left coronary artery. 3. The left circumflex coronary artery is a medium-size nondominant     vessel.  There is a long segment of smooth tubular stenosis of     about 50% in the proximal third of the vessel.  Two medium-sized     oblique marginal vessels are seen, after which the vessel is  totally occluded.  Branches of a third oblique marginal vessel     which is also medium size are seen filling in a retrograde fashion     via left-to-left collaterals.  The collaterals are of mediocre     quality. 4. The right coronary artery is flush occluded at the ostium.     However, the entire course of the vessel can be seen filling in a     retrograde fashion via left-to-right collaterals.  The vessel     itself appears to be fairly healthy without any other visible     stenoses.  A single major branch (posterior descending artery) is     seen to the inferior wall. 5. The left ventricle is normal in size, regional wall motion overall     systolic function with estimated ejection fraction of about 60%.     The  inferior wall shows excellent contractility.  The end-diastolic     pressure is 10 mmHg.  There is no evidence of aortic stenosis or     mitral regurgitation.  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  At this point in time, his symptoms are fairly mild and revascularization does not appear to be justified.  The only way to provide adequate revascularization to be surgical bypass.  The focus should be on risk factor modification, primarily reduction in LDL cholesterol levels and complete cessation of smoking.     Thurmon Fair, MD     MC/MEDQ  D:  09/03/2011  T:  09/03/2011  Job:  [id number]  Electronically Signed by Thurmon Fair M.D. on 09/12/2011 11:26:29 AM

## 2011-09-12 NOTE — Telephone Encounter (Signed)
Spoke with patient's wife and notified her of refill. She will have patient call in and schedule appt because he wasn't there when I called.

## 2011-09-13 ENCOUNTER — Other Ambulatory Visit: Payer: Self-pay | Admitting: Vascular Surgery

## 2011-09-13 DIAGNOSIS — Z48812 Encounter for surgical aftercare following surgery on the circulatory system: Secondary | ICD-10-CM

## 2011-09-13 DIAGNOSIS — I6529 Occlusion and stenosis of unspecified carotid artery: Secondary | ICD-10-CM

## 2011-09-18 LAB — BASIC METABOLIC PANEL
BUN: 10
BUN: 17
Calcium: 8.2 — ABNORMAL LOW
Calcium: 9
Creatinine, Ser: 0.76
Creatinine, Ser: 0.92
GFR calc non Af Amer: >60
GFR calc non Af Amer: >60
Glucose, Bld: 82
Potassium: 3.3 — ABNORMAL LOW
Sodium: 137

## 2011-09-18 LAB — CBC
Platelets: 212
RDW: 14

## 2011-09-18 LAB — HEMOGLOBIN AND HEMATOCRIT, BLOOD: Hemoglobin: 13.3

## 2011-09-21 LAB — CBC
HCT: 42.3
HCT: 37.6 — ABNORMAL LOW
HCT: 46.7
MCHC: 33.8
MCHC: 33.6
MCV: 90.3
MCV: 88.5
MCV: 90.4
Platelets: 277
Platelets: 308
Platelets: 369
RBC: 4.68
RBC: 4.73
RDW: 13.1
RDW: 13.3
WBC: 10.2
WBC: 7.7
WBC: 13.2 — ABNORMAL HIGH

## 2011-09-21 LAB — URINALYSIS, ROUTINE W REFLEX MICROSCOPIC
Bilirubin Urine: NEGATIVE
Glucose, UA: NEGATIVE
Ketones, ur: NEGATIVE
Nitrite: NEGATIVE
Nitrite: NEGATIVE
Protein, ur: NEGATIVE
Protein, ur: NEGATIVE
Specific Gravity, Urine: 1.021
Urobilinogen, UA: 1
pH: 6

## 2011-09-21 LAB — BASIC METABOLIC PANEL
BUN: 12
Calcium: 9.3
Chloride: 93 — ABNORMAL LOW
Creatinine, Ser: 0.76
Creatinine, Ser: 0.62
GFR calc Af Amer: >60
GFR calc non Af Amer: >60
Glucose, Bld: 179 — ABNORMAL HIGH
Glucose, Bld: 170 — ABNORMAL HIGH
Potassium: 3.2 — ABNORMAL LOW
Sodium: 131 — ABNORMAL LOW

## 2011-09-21 LAB — DIFFERENTIAL
Basophils Relative: 0
Eosinophils Absolute: 0.1
Eosinophils Absolute: 0.4
Eosinophils Relative: 1
Eosinophils Relative: 4
Lymphocytes Relative: 18
Lymphs Abs: 1.7
Lymphs Abs: 1.8
Monocytes Absolute: 0.9
Monocytes Relative: 11
Monocytes Relative: 12
Neutrophils Relative %: 64

## 2011-09-21 LAB — COMPREHENSIVE METABOLIC PANEL
ALT: 22
AST: 20
AST: 17
Albumin: 3.8
Albumin: 4.1
BUN: 39 — ABNORMAL HIGH
Calcium: 8.9
Calcium: 9.5
Chloride: 91 — ABNORMAL LOW
Creatinine, Ser: 1.36
GFR calc Af Amer: >60
GFR calc Af Amer: >60
Sodium: 130 — ABNORMAL LOW
Total Bilirubin: 0.9
Total Protein: 6.7
Total Protein: 7.1

## 2011-09-21 LAB — CROSSMATCH: ABO/RH(D): A POS

## 2011-09-21 LAB — APTT: aPTT: 27

## 2011-09-21 LAB — ABO/RH: ABO/RH(D): A POS

## 2011-09-21 LAB — LIPID PANEL
HDL: 25 — ABNORMAL LOW
Total CHOL/HDL Ratio: 4
VLDL: 49 — ABNORMAL HIGH

## 2011-09-21 LAB — PROTIME-INR: INR: 0.9

## 2011-09-21 LAB — HEMOGLOBIN A1C: Mean Plasma Glucose: 168

## 2011-09-21 LAB — FOLATE RBC: RBC Folate: 887 — ABNORMAL HIGH

## 2011-09-29 ENCOUNTER — Other Ambulatory Visit: Payer: Self-pay | Admitting: Family Medicine

## 2011-10-07 ENCOUNTER — Other Ambulatory Visit: Payer: Self-pay | Admitting: Family Medicine

## 2011-10-07 DIAGNOSIS — Z0000 Encounter for general adult medical examination without abnormal findings: Secondary | ICD-10-CM

## 2011-10-07 DIAGNOSIS — E119 Type 2 diabetes mellitus without complications: Secondary | ICD-10-CM

## 2011-10-07 DIAGNOSIS — E7800 Pure hypercholesterolemia, unspecified: Secondary | ICD-10-CM

## 2011-10-07 DIAGNOSIS — I10 Essential (primary) hypertension: Secondary | ICD-10-CM

## 2011-10-07 DIAGNOSIS — Z125 Encounter for screening for malignant neoplasm of prostate: Secondary | ICD-10-CM

## 2011-10-08 ENCOUNTER — Other Ambulatory Visit (INDEPENDENT_AMBULATORY_CARE_PROVIDER_SITE_OTHER): Payer: Medicare Other

## 2011-10-08 ENCOUNTER — Encounter: Payer: Medicare Other | Attending: Neurosurgery | Admitting: Neurosurgery

## 2011-10-08 DIAGNOSIS — M1710 Unilateral primary osteoarthritis, unspecified knee: Secondary | ICD-10-CM | POA: Insufficient documentation

## 2011-10-08 DIAGNOSIS — Z0000 Encounter for general adult medical examination without abnormal findings: Secondary | ICD-10-CM

## 2011-10-08 DIAGNOSIS — E7800 Pure hypercholesterolemia, unspecified: Secondary | ICD-10-CM

## 2011-10-08 DIAGNOSIS — I10 Essential (primary) hypertension: Secondary | ICD-10-CM

## 2011-10-08 DIAGNOSIS — M545 Low back pain, unspecified: Secondary | ICD-10-CM | POA: Insufficient documentation

## 2011-10-08 DIAGNOSIS — Z125 Encounter for screening for malignant neoplasm of prostate: Secondary | ICD-10-CM

## 2011-10-08 DIAGNOSIS — M961 Postlaminectomy syndrome, not elsewhere classified: Secondary | ICD-10-CM | POA: Insufficient documentation

## 2011-10-08 DIAGNOSIS — E119 Type 2 diabetes mellitus without complications: Secondary | ICD-10-CM

## 2011-10-08 DIAGNOSIS — M25519 Pain in unspecified shoulder: Secondary | ICD-10-CM | POA: Insufficient documentation

## 2011-10-08 DIAGNOSIS — M25529 Pain in unspecified elbow: Secondary | ICD-10-CM

## 2011-10-08 LAB — COMPREHENSIVE METABOLIC PANEL
AST: 20 U/L (ref 0–37)
Albumin: 4 g/dL (ref 3.5–5.2)
BUN: 20 mg/dL (ref 6–23)
CO2: 29 mEq/L (ref 19–32)
Calcium: 9.6 mg/dL (ref 8.4–10.5)
Chloride: 101 mEq/L (ref 96–112)
GFR: 88.76 mL/min (ref >60.00)
Glucose, Bld: 170 mg/dL — ABNORMAL HIGH (ref 70–99)
Potassium: 4 mEq/L (ref 3.5–5.1)

## 2011-10-08 LAB — LIPID PANEL: Cholesterol: 109 mg/dL (ref 0–200)

## 2011-10-08 LAB — HEMOGLOBIN A1C: Hgb A1c MFr Bld: 7.1 % — ABNORMAL HIGH (ref 4.6–6.5)

## 2011-10-08 LAB — MICROALBUMIN / CREATININE URINE RATIO
Creatinine,U: 58.8 mg/dL
Microalb, Ur: 0.6 mg/dL (ref 0.0–1.9)

## 2011-10-08 LAB — PSA: PSA: 0.55 ng/mL (ref 0.10–4.00)

## 2011-10-08 LAB — LDL CHOLESTEROL, DIRECT: Direct LDL: 52.6 mg/dL

## 2011-10-09 NOTE — Assessment & Plan Note (Signed)
This is a patient of Dr. Pamelia Hoit seen for multiple pain complaints mostly located in right shoulder and low back.  Right knee reports no change.  The pain is 7-8.  This is a sharp, constant pain, and general activity level is 7-8.  He does not indicate when his pain is worse, what activity, or what modalities helps.  Mobility wise he uses a single- point cane.  He does not climb steps.  He drives.  He walks about 5 minutes at a time.  He is on disability and needs some help with household duties.  REVIEW OF SYSTEMS:  Notable for difficulties described above as well as some night sweats, numbness, trouble walking.  No suicidal thoughts. His pill counts were correct today.  Last UDS was consistent.  We will obtain another UDS at next appointment.  Oswestry score is 72.  Past medical history, social history, family history unchanged.  PHYSICAL EXAMINATION:  Blood pressure 138/69, pulse 64, respirations 16, O2 sats 96 on room air.  His motor strength and sensation are intact. Constitutionally, he is disheveled.  He is alert and oriented x3.  His gait is normal.  ASSESSMENT: 1. Lumbago post laminectomy syndrome. 2. Right shoulder pain. 3. Osteoarthritis of the right knee.  Pending arthroplasty with Dr.     Thomasena Edis at [facility]  PLAN: 1. Vicodin 7.5/325 one p.o. q.i.d., 120 with no refill. 2. MS Contin 15 mg 1 p.o. q.a.m., 30 with no refill. 3. His questions were encouraged and answered.  He will follow up with     Dr. Pamelia Hoit next month.     Wilhelmina Hark L. Blima Dessert Electronically Signed    RLW/MedQ D:  10/08/2011 13:46:26  T:  10/09/2011 04:24:41  Job #:  [id number]

## 2011-10-10 ENCOUNTER — Encounter: Payer: Medicare Other | Admitting: Family Medicine

## 2011-10-12 ENCOUNTER — Encounter: Payer: Medicare Other | Admitting: Family Medicine

## 2011-11-09 ENCOUNTER — Encounter: Payer: Self-pay | Admitting: Family Medicine

## 2011-11-09 ENCOUNTER — Ambulatory Visit: Payer: Medicare Other | Admitting: Physical Medicine and Rehabilitation

## 2011-11-12 ENCOUNTER — Encounter
Payer: Medicare Other | Attending: Physical Medicine and Rehabilitation | Admitting: Physical Medicine and Rehabilitation

## 2011-11-12 ENCOUNTER — Encounter: Payer: Medicare Other | Admitting: Physical Medicine and Rehabilitation

## 2011-11-12 ENCOUNTER — Ambulatory Visit (INDEPENDENT_AMBULATORY_CARE_PROVIDER_SITE_OTHER): Payer: Medicare Other | Admitting: Family Medicine

## 2011-11-12 ENCOUNTER — Encounter: Payer: Self-pay | Admitting: Family Medicine

## 2011-11-12 VITALS — BP 160/82 | HR 66 | Temp 98.7°F | Wt 155.2 lb

## 2011-11-12 DIAGNOSIS — I10 Essential (primary) hypertension: Secondary | ICD-10-CM | POA: Insufficient documentation

## 2011-11-12 DIAGNOSIS — G894 Chronic pain syndrome: Secondary | ICD-10-CM

## 2011-11-12 DIAGNOSIS — M545 Low back pain, unspecified: Secondary | ICD-10-CM | POA: Insufficient documentation

## 2011-11-12 DIAGNOSIS — M47817 Spondylosis without myelopathy or radiculopathy, lumbosacral region: Secondary | ICD-10-CM | POA: Insufficient documentation

## 2011-11-12 DIAGNOSIS — E119 Type 2 diabetes mellitus without complications: Secondary | ICD-10-CM | POA: Insufficient documentation

## 2011-11-12 DIAGNOSIS — F17200 Nicotine dependence, unspecified, uncomplicated: Secondary | ICD-10-CM

## 2011-11-12 DIAGNOSIS — Z0000 Encounter for general adult medical examination without abnormal findings: Secondary | ICD-10-CM

## 2011-11-12 DIAGNOSIS — Z23 Encounter for immunization: Secondary | ICD-10-CM

## 2011-11-12 DIAGNOSIS — M1710 Unilateral primary osteoarthritis, unspecified knee: Secondary | ICD-10-CM | POA: Insufficient documentation

## 2011-11-12 DIAGNOSIS — K219 Gastro-esophageal reflux disease without esophagitis: Secondary | ICD-10-CM | POA: Insufficient documentation

## 2011-11-12 DIAGNOSIS — R220 Localized swelling, mass and lump, head: Secondary | ICD-10-CM

## 2011-11-12 DIAGNOSIS — Z79899 Other long term (current) drug therapy: Secondary | ICD-10-CM | POA: Insufficient documentation

## 2011-11-12 DIAGNOSIS — M25569 Pain in unspecified knee: Secondary | ICD-10-CM | POA: Insufficient documentation

## 2011-11-12 DIAGNOSIS — E7800 Pure hypercholesterolemia, unspecified: Secondary | ICD-10-CM

## 2011-11-12 DIAGNOSIS — M79609 Pain in unspecified limb: Secondary | ICD-10-CM | POA: Insufficient documentation

## 2011-11-12 MED ORDER — AMLODIPINE BESYLATE 10 MG PO TABS: 10.0000 mg | ORAL_TABLET | Freq: Every day | ORAL | Status: DC

## 2011-11-12 NOTE — Assessment & Plan Note (Signed)
Chronic. LDL well controlled. HDL and triglycerides abnormal.  rec increase fish in diet, if not improving, consider starting fish oil.

## 2011-11-12 NOTE — Assessment & Plan Note (Signed)
Chronic, deteriorated. Increase amlodipine to 10mg  daily.  RTC 3-6 mo for f/u.

## 2011-11-12 NOTE — Assessment & Plan Note (Signed)
Found to be cyst, removed, pt states getting yearly ultrasounds.

## 2011-11-12 NOTE — Assessment & Plan Note (Signed)
Continue to encourage cessation. Pt contemplative. Requests sample of nicotrol, provided.

## 2011-11-12 NOTE — Assessment & Plan Note (Signed)
HISTORY:  Bobby Henry is a pleasant 65 year old gentleman who is followed [facility] at our [facility].  He has chronic pain complaints related to his low back, his knees or recently his right shoulder.  He is having more problems with his right knee.  He has end-Henry osteoarthritis.  Here he has been using a cane to ambulate.  His average pain is between 7 and 8 on a scale of 10.  He gets fair relief with current meds.  His right shoulder had given him more problems that seems to be doing somewhat better now.  He uses his arm to bear weight through his cane.  He is back in today requesting refill of his pain medications.  He takes MS Contin 15 mg once a day in the morning and Vicodin 7.5/325, four times a day to manage his pain.  FUNCTIONAL STATUS:  He can walk 3 or 4 minutes at a time.  He has difficulty with stairs.  He does drive.  He is independent with self- care.  REVIEW OF SYSTEMS:  Positive only for trouble walking.  PAST MEDICAL, SOCIAL AND FAMILY HISTORY:  Notable for elevated blood pressure.  He has a followup appointment today right after our visit with his primary care physician at about 11:30 to check on his blood pressure.  He has been running elevated blood pressures recently apparently.  PHYSICAL EXAMINATION:  VITAL SIGNS:  His blood pressure is 185/95, pulse 71, respirations 16, 97% saturated on room air.  He is 156 pounds and 5 feet 7. GENERAL:  He is a thin male who appears his stated age.  Does not appear in any distress.  He is oriented x3.  His speech is clear.  Affect is bright.  He is alert, cooperative, and pleasant.  Follows commands without difficulty.  Answers my questions appropriately.  NEURO: Cranial nerves and coordination are relatively intact.  His reflexes are diminished in lower extremities.  He has diminished sensation over the left thigh.  His strength is relatively well-preserved in both  lower extremities, 5/5 at hip flexors, knee extensors, dorsiflexors, plantar flexors.  He transitions from sitting to standing without difficulty.  His gait is antalgic.  He prefers to ambulate with a cane.  He has limited lumbar mobility in all planes.  He appears to have about a 10 degree flexion contracture at the right knee.  He can straighten it, however, it is somewhat painful for me as a mild effusion here as well.  He has joint line tenderness.  IMPRESSION: 1. Lumbago status post L2-3 diskectomy, Dr. Danielle Dess November 21, 2004. 2. Chronic bilateral knee pain, right knee.  He has known end-Henry     tricompartmental degenerative changes recently seen by a [facility].  Bobby Henry tells me again today he plans to have     the knee replaced in the upcoming months. 3. Right shoulder pain, overall improved.  PLAN:  I will refill his pain medications today MS Contin 15 mg 1 p.o. q.a.m. #30, Vicodin 7.5/325 one p.o. q.i.d. #120.  I have asked him to consider physical therapy to help improve the range of motion in his leg and improve strengthening around his knee, he declines.  He is not sure when he is going to get back in for an Orthopedic evaluation.  Apparently, he has some family issues going on at home.  Regarding his pain medications, his fill date was October 08, 2011, today's  date November 12, 2011.  He is 30+ days since his last prescription, and he is out of medications.  It is noted that in August 22, 2011, he was short pain medications, this was discussed.  He had an inconsistent UDS earlier that month which was why he was brought in for pill count.  I am unable to really do pill count today because his appointment is beyond 30 days of his last visit.  I will bring him back in 3 weeks and intend to do pill count at that time.  I have encouraged him to maintain contact with Orthopedics to help manage this end-Henry arthritic knee.  He has been  dealing with for quite a while.  He has multiple medical problems including a history of carotid endarterectomy, nicotine addiction, history of esophageal reflux, hypertension and diabetes.  At this time, he is comfortable with the treatment plan.  He knows I would like to get him in some therapy, however, he is reluctant at this time.  I will see him back in 3 weeks.     Bobby Henry, M.D. Electronically Signed    DMK/MedQ D:  11/12/2011 09:40:41  T:  11/12/2011 10:22:22  Job #:  [id number]

## 2011-11-12 NOTE — Patient Instructions (Addendum)
Schedule follow up with Dr. Delford Field as you're due. Blood pressure staying too high - increase amlodipine to 10mg  daily - 2 pills until you run out then new script will be for 10mg  daily so just one pill at a time. Decrease added sugars, eliminate trans fats, increase fiber and limit alcohol.  All these changes together can drop triglycerides by almost 50%.  Increase fish in diet - 2 x/wk if able.  If not improving good cholesterol/triglycerides, may discuss starting fish oil. You need to quit smoking! Nicotrol inhaler sample provided. Good to see you today, call us with questions.

## 2011-11-12 NOTE — Assessment & Plan Note (Signed)
Lab Results  Component Value Date   HGBA1C 7.1* 10/08/2011  reviewed blood work with pt.

## 2011-11-12 NOTE — Progress Notes (Signed)
Subjective:    Patient ID: Bobby Mimes Sr., male    DOB: 01/01/1946, 65 y.o.   MRN: [id number]  HPI CC: CPE  Saw pain clinic at 8:30 am this morning.  Recommending knee surgery.  HTN - at home running high.  Did take meds this am.  Reports compliance.  Up to 200/140 last week.  + HA last week.  No vision changes, CP/tightness, SOB, leg swelling.  Some reflux.  Pulm nodule - Dr. Delford Field - rpt CT stable 2012.  rec rpt in 1 year.  Has not f/u with pulm recently, pt thinks due for recheck.  smoking - down to ~1/2 ppd.  <10 cig/day.  Considering e cigarette.  Insurance wouldn't pay for nicotine inhaler.  Would like sample today.  DM - compliant with meds. no concerns, no lows.  preventative:  Td 08/2010 Flu 08/2010 PNA 11/2006 colonoscopy - 2008, rec rpt 2018.  Diverticulosis. due for DRE today.  PSA reassuring at 0.55.  No voiding issues.  Would like to hold off on future screening. No problems with hearing or vision.  No recent eye visit.  No recent dentist - has upper dentures. Has had falls but attributes to knee not imbalance or dizziness or other balance issue.    Medications and allergies reviewed and updated in chart.  Past histories reviewed and updated if relevant as below. Patient Active Problem List  Diagnoses  . DIABETES MELLITUS II, UNCOMPLICATED  . HYPERCHOLESTEROLEMIA  . IMPOTENCE INORGANIC  . TOBACCO DEPENDENCE  . HYPERTENSION, BENIGN SYSTEMIC  . 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 SUPERFICIAL VEIN THROMBOSIS  . PULMONARY NODULE, SOLITARY  . GASTROESOPHAGEAL REFLUX, NO ESOPHAGITIS  . SEBACEOUS CYST, SCALP  . OSTEOARTHRITIS, MULTI SITES  . DISC WITH RADICULOPATHY  . NECK MASS   Past Medical History  Diagnosis Date  . HTN (hypertension)   . HLD (hyperlipidemia)   . DMII (diabetes mellitus, type 2)   . GERD (gastroesophageal reflux disease)   . PUD (peptic ulcer disease)   . Tobacco abuse   . Hx-TIA (transient ischemic attack) 2009    s/p  CEA - Dr. Hart Rochester  . Chronic low back pain   . Psychosexual dysfunction with inhibited sexual excitement   . Occlusion and stenosis of carotid artery without mention of cerebral infarction   . Osteoarthrosis, unspecified whether generalized or localized, other specified sites   . Other diseases of lung, not elsewhere classified     solitary pulmonary nodule (enlarged 2011)-follow up per Dr. Levonne Lapping on 2012 CT  . Embolism and thrombosis of unspecified site   . Diverticulosis    Past Surgical History  Procedure Date  . Nose surgery 1982  . Neck surgery 1984  . Knee surgery 1997  . Carotid endarterectomy 5/09    with dacron patch angioplasty with resection of redundant carotid artery with primary reanastomosis  . Esophagogastroduodenoscopy 2007    deformed pylorus; gastric ulcer  . Gastrojejunostomy 4/09    laparoscopic; and high selective anterior and posterior vagotomies  . Back surgery   . Cervical spine surgery   . Colonoscopy 2008    diverticulosis (Dr. Bosie Clos) repeat 2018   History  Substance Use Topics  . Smoking status: Current Everyday Smoker -- 0.5 packs/day    Types: Cigarettes  . Smokeless tobacco: Not on file  . Alcohol Use: No   Family History  Problem Relation Age of Onset  . Heart attack Father     CABG 6v  . Arthritis Father   .  Alzheimer's disease Mother   . Arthritis Mother   . Bipolar disorder Sister   . Bipolar disorder Sister   . Cancer Neg Hx   . Diabetes Neg Hx   . Stroke Neg Hx    Allergies  Allergen Reactions  . Varenicline Tartrate     REACTION: personality change   Current Outpatient Prescriptions on File Prior to Visit  Medication Sig Dispense Refill  . amLODipine (NORVASC) 5 MG tablet Take 5 mg by mouth daily.        [name] aspirin 81 MG tablet Take 81 mg by mouth daily.        . enalapril-hydrochlorothiazide (VASERETIC) 10-25 MG per tablet TAKE 2 TABLETS BY MOUTH DAILY  60 tablet  0  . gabapentin (NEURONTIN) 300 MG capsule Take 300  mg by mouth 3 (three) times daily.        [name] glipiZIDE (GLUCOTROL) 10 MG tablet Take 10 mg by mouth 2 (two) times daily before a meal.        . Hydrocodone-Acetaminophen 7.5-300 MG TABS Take 1 tablet by mouth every 4 (four) hours as needed.        . metFORMIN (GLUCOPHAGE-XR) 750 MG 24 hr tablet Take 1 tablet (750 mg total) by mouth daily with breakfast.  30 tablet  6  . metoprolol (LOPRESSOR) 50 MG tablet Take 75 mg by mouth 2 (two) times daily.        [name] morphine (MS CONTIN) 30 MG 12 hr tablet Take 30 mg by mouth 2 (two) times daily.        [name] omeprazole (PRILOSEC OTC) 20 MG tablet Take 20 mg by mouth 2 (two) times daily.        . rosuvastatin (CRESTOR) 10 MG tablet Take 10 mg by mouth daily.        [name] zolpidem (AMBIEN) 5 MG tablet Take 5 mg by mouth at bedtime as needed.        . lidocaine (LIDODERM) 5 % Place 1 patch onto the skin daily. Remove & Discard patch within 12 hours or as directed by MD        Review of Systems  Constitutional: Negative for fever, chills, activity change, appetite change, fatigue and unexpected weight change.  HENT: Negative for hearing loss and neck pain.   Eyes: Negative for visual disturbance.  Respiratory: Negative for cough, chest tightness, shortness of breath and wheezing.   Cardiovascular: Negative for chest pain, palpitations and leg swelling.  Gastrointestinal: Negative for nausea, vomiting, abdominal pain, diarrhea, constipation, blood in stool and abdominal distention.  Genitourinary: Negative for hematuria and difficulty urinating.  Musculoskeletal: Negative for myalgias and arthralgias.  Skin: Negative for rash.  Neurological: Negative for dizziness, seizures, syncope and headaches.  Hematological: Does not bruise/bleed easily.  Psychiatric/Behavioral: Negative for dysphoric mood. The patient is not nervous/anxious.        Objective:   Physical Exam  Nursing note and vitals reviewed. Constitutional: He is oriented to person, place, and time. He  appears well-developed and well-nourished. No distress.  HENT:  Head: Normocephalic and atraumatic.  Right Ear: External ear normal.  Left Ear: External ear normal.  Nose: Nose normal.  Mouth/Throat: Oropharynx is clear and moist. No oropharyngeal exudate.  Eyes: Conjunctivae and EOM are normal. Pupils are equal, round, and reactive to light. No scleral icterus.  Neck: Normal range of motion. Neck supple. No thyromegaly present.  Cardiovascular: Normal rate, regular rhythm, normal heart sounds and intact distal pulses.   No murmur heard.  Pulses:      Radial pulses are 2+ on the right side, and 2+ on the left side.  Pulmonary/Chest: Effort normal and breath sounds normal. No respiratory distress. He has no wheezes. He has no rales.       Mildly coarse  Abdominal: Soft. Bowel sounds are normal. He exhibits no distension and no mass. There is no tenderness. There is no rebound and no guarding.  Musculoskeletal: Normal range of motion. He exhibits no edema.  Lymphadenopathy:    He has no cervical adenopathy.  Neurological: He is alert and oriented to person, place, and time.       CN grossly intact, station and gait intact  Skin: Skin is warm and dry. No rash noted.  Psychiatric: He has a normal mood and affect. His behavior is normal. Judgment and thought content normal.      Assessment & Plan:

## 2011-11-12 NOTE — Assessment & Plan Note (Signed)
I have personally reviewed the Medicare Annual Wellness questionnaire and have noted 1. The patient's medical and social history 2. Their use of alcohol, tobacco or illicit drugs 3. Their current medications and supplements 4. The patient's functional ability including ADL's, fall risks, home safety risks and hearing or visual impairment. 5. Diet and physical activities 6. Evidence for depression or mood disorders The patients weight, height, BMI have been recorded in the chart.  Hearing and vision has been addressed. I have made referrals, counseling and provided education to the patient based review of the above and I have provided the pt with a written personalized care plan for preventive services.  Medicare wellness questionairre reviewed and scanned. Flu and 2nd (and final) PNA today. UTD tetanus.

## 2011-11-19 ENCOUNTER — Other Ambulatory Visit: Payer: Self-pay | Admitting: Family Medicine

## 2011-11-29 ENCOUNTER — Other Ambulatory Visit: Payer: Self-pay | Admitting: Family Medicine

## 2011-12-07 ENCOUNTER — Ambulatory Visit: Payer: Medicare Other | Admitting: Physical Medicine and Rehabilitation

## 2011-12-13 ENCOUNTER — Encounter
Payer: Medicare Other | Attending: Physical Medicine and Rehabilitation | Admitting: Physical Medicine and Rehabilitation

## 2011-12-13 DIAGNOSIS — Z79899 Other long term (current) drug therapy: Secondary | ICD-10-CM | POA: Insufficient documentation

## 2011-12-13 DIAGNOSIS — M545 Low back pain, unspecified: Secondary | ICD-10-CM | POA: Insufficient documentation

## 2011-12-13 DIAGNOSIS — M25569 Pain in unspecified knee: Secondary | ICD-10-CM | POA: Insufficient documentation

## 2011-12-13 DIAGNOSIS — M4802 Spinal stenosis, cervical region: Secondary | ICD-10-CM

## 2011-12-13 DIAGNOSIS — M542 Cervicalgia: Secondary | ICD-10-CM

## 2011-12-13 DIAGNOSIS — M47817 Spondylosis without myelopathy or radiculopathy, lumbosacral region: Secondary | ICD-10-CM | POA: Insufficient documentation

## 2011-12-13 DIAGNOSIS — M1710 Unilateral primary osteoarthritis, unspecified knee: Secondary | ICD-10-CM

## 2011-12-14 NOTE — Assessment & Plan Note (Signed)
Bobby Henry is a pleasant 65 year old gentleman who is seen at our clinic for multiple pain complaints, predominantly low back and right knee pain.  He has tricompartmental osteoarthritis of the right knee. He has had 2 previous lumbar spine operations and a cervical spine operation.  The last lumbar surgery was back in 2005 by Dr. Danielle Dess, L2-3 diskectomy at that time.  He continues to have pain predominantly in both these areas averaging 7-8 on a scale of 10, described as sharp, limiting activity rather significantly.  He does use a cane for ambulation.  Pain is constant throughout the day especially with activity, improves with some rest.  He gets some relief with current meds.  FUNCTIONAL STATUS:  He can walk 3-4 minutes.  He has difficulty with stairs.  He is able to drive.  He is independent with self-care.  Medications prescribed through [facility] include: 1. MS Contin 15 mg once in the morning. 2. Vicodin 7.5/325 four times a day. 3. He is also on gabapentin 300 mg q.i.d.  He reports no adverse reactions with these medications.  He tolerates them well.  No constipation.  No problems with oversedation.  REVIEW OF SYSTEMS:  Positive for problems with ambulation otherwise negative.  No other changes in past medical, social, or family history since last visit.  PHYSICAL EXAMINATION:  VITAL SIGNS:  Blood pressure is 185/95, pulse 71, respirations 16, 97% saturated on room air. GENERAL:  He is a thin male who appears his stated age.  He is oriented x3.  Speech is clear.  Affect is bright.  He is alert, cooperative, and pleasant.  Follows commands without difficulty.  Answers my questions appropriately. NEUROLOGIC: Cranial nerves and coordination are intact.  His reflexes are diminished in the lower extremities.  No abnormal tone, clonus, or tremors are noted. MUSCULOSKELETAL:  Motor strength is 5/5 at hip flexors, knee extensors, dorsiflexors, plantar  flexors.  Straight leg raise is negative.  He has some sensory deficits over the left anterolateral thigh.  Transitions from sitting to standing relatively easily today.  His gait is stable, less antalgic and I have seen him in the past.  He has limitations in lumbar motion in all planes.  He has a flexion contracture at the right knee.  IMPRESSION: 1. Lumbago with lumbar spondylosis. 2. Chronic bilateral knee pain worse on the right with end-stage     tricompartmental degenerative changes.  PLAN:  Refill his MS Contin 15 mg 1 p.o. q.a.m. and Vicodin 7.5/325 four times a day.  His fill date was October 08, 2011, today November 12, 2011, he has no pills left.  We will have him return to clinic in 3 weeks for pill count and urine drug screen.  We will have followup with nurse practitioner for this.  I have answered all his questions.  He is comfortable with our plan at this time.  We will continue to monitor his narcotic pain medication usage.     Brantley Stage, M.D. Electronically Signed    DMK/MedQ D:  12/13/2011 09:28:46  T:  12/14/2011 03:58:13  Job #:  [id number]

## 2011-12-25 HISTORY — PX: CARDIAC CATHETERIZATION: SHX172

## 2011-12-31 ENCOUNTER — Encounter: Payer: Medicare Other | Admitting: Neurosurgery

## 2012-01-10 ENCOUNTER — Encounter: Payer: Medicare Other | Attending: Neurosurgery | Admitting: Neurosurgery

## 2012-01-10 DIAGNOSIS — M545 Low back pain, unspecified: Secondary | ICD-10-CM | POA: Insufficient documentation

## 2012-01-10 DIAGNOSIS — M25569 Pain in unspecified knee: Secondary | ICD-10-CM

## 2012-01-10 DIAGNOSIS — M47817 Spondylosis without myelopathy or radiculopathy, lumbosacral region: Secondary | ICD-10-CM | POA: Insufficient documentation

## 2012-01-11 NOTE — Assessment & Plan Note (Signed)
This is a patient of Dr. Pamelia Hoit seen for multiple pain complaints. He reports no change in his pain at 7 or 8.  It is a sharp, burning, tingling pain.  General activity level is 6-7.  Pain is worse during the evening and night.  Sleep patterns are poor.  All activities aggravate and medication helps some.  He uses a cane for ambulation.  He does not climb steps.  He does drive and walk about 5 minutes at a time. Functionally he is independent.  REVIEW OF SYSTEMS:  Notable for difficulties described above as well as some high blood sugars.  No suicidal thoughts.  He is out of his medicine 5 days early, telling me he took his last one the day before yesterday and he was counseled against running out early versus being discharged from the clinic.  He stated understanding.  PAST MEDICAL HISTORY/SOCIAL HISTORY/FAMILY HISTORY:  Unchanged.  PHYSICAL EXAMINATION:  His blood pressure is 169/79, his pulse is 70, respirations 18, O2 sats 96 on room air.  Constitutionally he is within normal limits.  He is alert and oriented x3.  He has a slight limp.  IMPRESSION: 1. Lumbago with lumbar spondylosis 2. Bilateral knee pain.  PLAN: 1. Refill Vicodin 7.5/325, 1 p.o. q.i.d., #120 with no refill 2. MS Contin 15 mg 1 p.o. q.a.m., #30 with no refills.  Questions were     encouraged and answered.  He will follow up with Dr. Pamelia Hoit in     a month.     Kylar Leonhardt L. Blima Dessert Electronically Signed    RLW/MedQ D:  01/10/2012 12:54:51  T:  01/11/2012 04:02:54  Job #:  [id number]

## 2012-01-22 ENCOUNTER — Other Ambulatory Visit: Payer: Self-pay | Admitting: Family Medicine

## 2012-01-23 ENCOUNTER — Telehealth: Payer: Self-pay | Admitting: Family Medicine

## 2012-01-23 ENCOUNTER — Encounter: Payer: Self-pay | Admitting: Family Medicine

## 2012-01-23 NOTE — Telephone Encounter (Signed)
Spoke with patient. He said he was feeling much better today and that his BP was normal this morning. He wasn't sure exactly what caused it drop last night. I advised that he needed to be checked to rule out cardiac or other causes. He said until his wife receives her check, he doesn't have the funds to come in. She should get it today or tomorrow and then he could come in. He said he was going to keep a close check on it today/this evening and if it drops again he will call me in the morning. I advised that if it happens again this evening to call EMS at least to have them come check him out to make sure he doesn't need to go to the hospital. I advised that since he was symptomatic last night he really did need to be evaluated. He verbalized understanding and said he would come in if it happened again.

## 2012-01-23 NOTE — Telephone Encounter (Signed)
I want him to keep log of blood pressures over next several days and call me next week with readings.  Want to ensure bp not too low. Ensure staying well hydrated with 5-6 glasses of water daily ** If sxs like what he had happens again, do recommend evaluation.

## 2012-01-23 NOTE — Telephone Encounter (Signed)
Triage Record Num: [id number] Operator: Albertine Grates Patient Name: Bobby Henry Call Date & Time: 01/22/2012 8:55:19PM Patient Phone: [phone] PCP: Eustaquio Boyden Patient Gender: Male PCP Fax : [phone] Patient DOB: Feb 14, 1946 Practice Name: [facility] MRN: [id number] Reason for Call: Caller: Randy/Spouse; PCP: Eustaquio Boyden; CB#: [phone]; Call regarding BP 105/51; Wife states pt's blood pressure dropped to 105/51 on 1-29. Was dizzy, "pasty" and had to lay him down. Color has returned at time of call. Wants to know if should not give evening Lopressor. Is able to walk. No dizziness. "Feels fine". Advised not to take med. Advised follow up with MD 1-30. Protocol(s) Used: Hypertension, Diagnosed or Suspected Recommended Outcome per Protocol: Call Provider within 8 Hours Reason for Outcome: Sudden drop in blood pressure (43mm/Hg or more than usual reading) AND recent change in prescription, nonprescription, or alternative medications or their dosages. Care Advice: ~ See a provider within 72 hours if continue to experiencing dizziness, light-headedness, or blurred vision. 01/22/2012 9:02:43PM Page 1 of 1 [id number]

## 2012-01-23 NOTE — Telephone Encounter (Signed)
Message left for patient to return my call to schedule appt. 

## 2012-01-24 NOTE — Telephone Encounter (Signed)
Patient notified. He will call next week with BP readings and increase water intake. He will come in if symptoms reoccur.

## 2012-02-11 ENCOUNTER — Encounter: Payer: Medicare Other | Attending: Physical Medicine and Rehabilitation

## 2012-02-11 ENCOUNTER — Other Ambulatory Visit: Payer: Self-pay | Admitting: Family Medicine

## 2012-02-11 DIAGNOSIS — M542 Cervicalgia: Secondary | ICD-10-CM

## 2012-02-11 DIAGNOSIS — M47817 Spondylosis without myelopathy or radiculopathy, lumbosacral region: Secondary | ICD-10-CM | POA: Insufficient documentation

## 2012-02-11 DIAGNOSIS — M25569 Pain in unspecified knee: Secondary | ICD-10-CM | POA: Insufficient documentation

## 2012-02-11 DIAGNOSIS — M545 Low back pain, unspecified: Secondary | ICD-10-CM | POA: Insufficient documentation

## 2012-02-11 DIAGNOSIS — G894 Chronic pain syndrome: Secondary | ICD-10-CM

## 2012-02-11 DIAGNOSIS — M79609 Pain in unspecified limb: Secondary | ICD-10-CM

## 2012-02-12 ENCOUNTER — Ambulatory Visit: Payer: Medicare Other | Admitting: Physical Medicine & Rehabilitation

## 2012-02-29 ENCOUNTER — Other Ambulatory Visit: Payer: Self-pay | Admitting: Physical Medicine and Rehabilitation

## 2012-03-10 ENCOUNTER — Encounter
Payer: Medicare Other | Attending: Physical Medicine and Rehabilitation | Admitting: Physical Medicine and Rehabilitation

## 2012-03-10 ENCOUNTER — Encounter: Payer: Self-pay | Admitting: Physical Medicine and Rehabilitation

## 2012-03-10 VITALS — BP 152/67 | HR 67 | Resp 16 | Ht 67.0 in | Wt 151.0 lb

## 2012-03-10 DIAGNOSIS — M545 Low back pain, unspecified: Secondary | ICD-10-CM | POA: Insufficient documentation

## 2012-03-10 DIAGNOSIS — M961 Postlaminectomy syndrome, not elsewhere classified: Secondary | ICD-10-CM

## 2012-03-10 DIAGNOSIS — M25569 Pain in unspecified knee: Secondary | ICD-10-CM | POA: Insufficient documentation

## 2012-03-10 DIAGNOSIS — M47817 Spondylosis without myelopathy or radiculopathy, lumbosacral region: Secondary | ICD-10-CM | POA: Insufficient documentation

## 2012-03-10 DIAGNOSIS — M47816 Spondylosis without myelopathy or radiculopathy, lumbar region: Secondary | ICD-10-CM

## 2012-03-10 DIAGNOSIS — Z79899 Other long term (current) drug therapy: Secondary | ICD-10-CM | POA: Insufficient documentation

## 2012-03-10 MED ORDER — HYDROCODONE-ACETAMINOPHEN 7.5-300 MG PO TABS: 1.0000 | ORAL_TABLET | Freq: Four times a day (QID) | ORAL | Status: DC

## 2012-03-10 MED ORDER — MORPHINE SULFATE CR 30 MG PO TB12: 30.0000 mg | ORAL_TABLET | Freq: Two times a day (BID) | ORAL | Status: DC

## 2012-03-10 NOTE — Patient Instructions (Signed)
Facet Block A facet block is an injection procedure used to numb nerves near a spinal joint (facet). The injection usually includes a medicine like Novacaine (anesthetic) and a steroid medicine (similar to cortisone). The injections are made directly into the facet joint of the back. They are used for patients with several types of neck or back pain problems (such as worsening arthritis or persistent pain after surgery) that have not been helped with anti-inflammatory medications, exercise programs, physical therapy, and other forms of pain management. Multiple injections may be needed depending on how many joints are involved.  A facet block procedure can be helpful with diagnosis as well as providing therapeutic pain relief. One of three things may happen after the procedure:  The pain does not go away. This can mean that the pain is probably not coming from blocked facet joints. This information is helpful with diagnosis.   The pain goes away and stays away for a few hours but the original pain comes back and does not get better again. This information is also helpful with diagnosis. It can mean that pain is probably coming from the joints; but the steroid was not helpful for longer term pain control.   The pain goes away after the block, then returns later that day, and then gets better again over the next few days. This can mean that the block was helpful for pain control and the steroid had a longer lasting effect.  If there is good, lasting benefit from the injections, the block may be repeated from 3 to 5 times. If there is good relief but it is only of short-term benefit, other procedures (such as radiofrequency lesioning) may be considered.  Note: The procedure cannot be performed if you have an active infection, a lesion on or near the area of injection, flu, cold, fever, very high blood pressure or if you are on blood thinners. Please make your doctor aware of any of these conditions. This is  for your safety!  LET YOUR CAREGIVER KNOW ABOUT:   Allergies.   Medications taken including herbs, eye drops, over the counter medications, and creams   Use of steroids (by mouth or creams).   Possible pregnancy, if applicable.   Previous problems with anesthetics or Novocaine.   History of blood clots.   History of bleeding or blood problems.   Previous surgery, particularly of the neck and/or back   Other health problems.  RISKS AND COMPLICATIONS These are very uncommon but include:  Bleeding.   Injury to a nerve near the injection site.   Weakness or numbness in areas controlled by nerves near the injection site.   Infection.   Pain at the site of the injection.   Temporary fluid retention in those who are prone to this problem.   Allergic reaction to anesthetics or medicines used during the procedure.  Diabetics may have a temporary increase in their blood sugar after any surgical procedure, especially if steroids are used. Stinging/burning of the numbing medicine is the most uncomfortable part of the procedure; however every person's response to any procedure is individual.  BEFORE THE PROCEDURE   Your caregiver will provide instructions about stopping any medication before the procedure.   Unless advised otherwise, if the injections are in your neck, you may take your medications as usual with a sip of water but do not eat or drink for 6 hours before the procedure.   Unless advised otherwise, you may eat, drink and take your medications as   usual on the day of the procedure (both before and after) if the injections are to be in your lower back.   There is no other specific preparation necessary unless advised otherwise.  PROCEDURE After checking your blood pressure, the procedure will be done in the x-ray (fluoroscopy) room while lying on your stomach. For procedures in the neck, an intravenous line is usually started. The back is then cleansed with an antiseptic  soap. Sterile drapes are placed in this area. The skin is numbed with a local anesthetic. This is felt as a stinging or burning sensation. Using x-ray guidance, needles are then advanced to the appropriate locations. Once the needles are in the proper location, the anesthetic and steroid is injected through the needles and the needles are removed. The skin is then cleansed and bandages are applied. Blood pressure will be checked again, and you will be discharged to leave with your ride after your caregiver says it is okay to go.  AFTER THE PROCEDURE  You may not drive for the remainder of the day after your procedure. An adult must be present to drive you home or to go with you in a taxi or on public transportation. The procedure will be canceled if you do not have a responsible adult with you! This is for your safety.  HOME CARE INSTRUCTIONS   The bandages noted above can be removed on the morning after the procedure.   Resume medications according to your caregiver's instructions.   No heat is to be used near or over the injected area(s) for the remainder of the day.   No tub bath or soaking in water (such as a pool, jacuzzi, etc.) for the remainder of the day.   Some local tenderness may be experienced for a couple of days after the injection. Using an ice pack three or four times a day will help this.   Keep track of the amount of pain relief as well as how long the pain relief lasted.  SEEK MEDICAL CARE IF:   There is drainage from the injection site.   Pain is not controlled with medications prescribed.   There is significant bleeding or swelling.  SEEK IMMEDIATE MEDICAL CARE IF:   You develop a fever of 101 F (38.3 C) or greater.   Worsening pain, swelling, and/or red streaking develops in the skin around the injection site.   Severe pain develops and cannot be controlled with medications prescribed.   You develop any headache, stiff neck, nausea, vomiting, or your eyes become  very sensitive to light.   Weakness or paralysis develops in arms or legs not present before the procedure.   You develop difficulty urinating or difficulty breathing.  Document Released: 05/01/2007 Document Revised: 11/29/2011 Document Reviewed: 04/21/2009 [employer] Patient Information 2012 [employer]. 

## 2012-03-10 NOTE — Progress Notes (Signed)
  Subjective:    Patient ID: Bobby Mimes Sr., male    DOB: [birth year], 66 y.o.   MRN: [id number]  HPI No new medical issues since last visit. He was seen on 01/10/2012 by Mr. Webb Silversmith. No new surgeries no other procedures no new medications.  Pain Inventory Average Pain 7 Pain Right Now 7 My pain is constant, sharp and stabbing  In the last 24 hours, has pain interfered with the following? General activity 6 Relation with others 6 Enjoyment of life 8 What TIME of day is your pain at its worst? all of the time Sleep (in general) Poor  Pain is worse with: walking, bending, sitting, inactivity and standing Pain improves with: rest and medication Relief from Meds: 5  Mobility use a cane how many minutes can you walk? 5 ability to climb steps?  no do you drive?  yes Do you have any goals in this area?  yes  Function disabled: date disabled 39  Neuro/Psych No problems in this area  Prior Studies Any changes since last visit?  no  Physicians involved in your care Primary care Dr Morton Stall Neurosurgeon Dr Danielle Dess Orthopedist Dr Thomasena Edis     Review of Systems  Musculoskeletal: Positive for back pain.       Shoulder pain, knee pain  All other systems reviewed and are negative.       Objective:   Physical Exam  Constitutional: He is oriented to person, place, and time. He appears well-developed and well-nourished.  HENT:  Head: Normocephalic and atraumatic.  Musculoskeletal:       Lumbar back: He exhibits decreased range of motion and pain. He exhibits no tenderness, no bony tenderness and no spasm.       Pain with extension, limited range of motion with extension Full forward flexion without pain  Neurological: He is alert and oriented to person, place, and time. He has normal strength. A sensory deficit is present.  Reflex Scores:      Patellar reflexes are 0 on the right side and 2+ on the left side.      Achilles reflexes are 1+ on the right side and 1+ on  the left side.      Left lateral thigh numbness  Gait is forward flexed at the hips. No evidence of toe drag         Assessment & Plan:  1. Lumbar postlaminectomy syndrome he is stable on his current medications. We'll continue current dosage. 2. Lumbar spondylosis he has pain mainly with extension. Patient may be a candidate for medial branch blocks. Give information.

## 2012-03-29 ENCOUNTER — Other Ambulatory Visit: Payer: Self-pay | Admitting: Family Medicine

## 2012-04-11 ENCOUNTER — Encounter: Payer: Self-pay | Admitting: *Deleted

## 2012-04-11 ENCOUNTER — Encounter: Payer: Medicare Other | Attending: Physical Medicine and Rehabilitation | Admitting: *Deleted

## 2012-04-11 VITALS — BP 169/81 | HR 65 | Resp 14 | Ht 67.0 in | Wt 153.0 lb

## 2012-04-11 DIAGNOSIS — M1990 Unspecified osteoarthritis, unspecified site: Secondary | ICD-10-CM

## 2012-04-11 DIAGNOSIS — M545 Low back pain, unspecified: Secondary | ICD-10-CM | POA: Insufficient documentation

## 2012-04-11 DIAGNOSIS — M961 Postlaminectomy syndrome, not elsewhere classified: Secondary | ICD-10-CM

## 2012-04-11 DIAGNOSIS — M47817 Spondylosis without myelopathy or radiculopathy, lumbosacral region: Secondary | ICD-10-CM | POA: Insufficient documentation

## 2012-04-11 DIAGNOSIS — M25569 Pain in unspecified knee: Secondary | ICD-10-CM | POA: Insufficient documentation

## 2012-04-11 MED ORDER — ZOLPIDEM TARTRATE 5 MG PO TABS: 5.0000 mg | ORAL_TABLET | Freq: Every evening | ORAL | Status: DC | PRN

## 2012-04-11 MED ORDER — MORPHINE SULFATE CR 30 MG PO TB12: 30.0000 mg | ORAL_TABLET | Freq: Every day | ORAL | Status: DC

## 2012-04-11 MED ORDER — HYDROCODONE-ACETAMINOPHEN 7.5-300 MG PO TABS: 1.0000 | ORAL_TABLET | Freq: Four times a day (QID) | ORAL | Status: DC

## 2012-04-11 MED ORDER — MORPHINE SULFATE CR 30 MG PO TB12: 30.0000 mg | ORAL_TABLET | Freq: Two times a day (BID) | ORAL | Status: DC

## 2012-04-11 MED ORDER — GABAPENTIN 300 MG PO CAPS: 300.0000 mg | ORAL_CAPSULE | Freq: Four times a day (QID) | ORAL | Status: DC

## 2012-04-17 ENCOUNTER — Other Ambulatory Visit: Payer: Self-pay | Admitting: Family Medicine

## 2012-05-09 ENCOUNTER — Encounter: Payer: Self-pay | Admitting: Physical Medicine and Rehabilitation

## 2012-05-09 ENCOUNTER — Encounter
Payer: Medicare Other | Attending: Physical Medicine and Rehabilitation | Admitting: Physical Medicine and Rehabilitation

## 2012-05-09 VITALS — BP 142/70 | HR 58 | Resp 14 | Ht 67.0 in | Wt 150.0 lb

## 2012-05-09 DIAGNOSIS — IMO0002 Reserved for concepts with insufficient information to code with codable children: Secondary | ICD-10-CM

## 2012-05-09 DIAGNOSIS — M47817 Spondylosis without myelopathy or radiculopathy, lumbosacral region: Secondary | ICD-10-CM | POA: Insufficient documentation

## 2012-05-09 DIAGNOSIS — M1710 Unilateral primary osteoarthritis, unspecified knee: Secondary | ICD-10-CM | POA: Insufficient documentation

## 2012-05-09 DIAGNOSIS — Z79899 Other long term (current) drug therapy: Secondary | ICD-10-CM | POA: Insufficient documentation

## 2012-05-09 DIAGNOSIS — M545 Low back pain, unspecified: Secondary | ICD-10-CM | POA: Insufficient documentation

## 2012-05-09 DIAGNOSIS — M25569 Pain in unspecified knee: Secondary | ICD-10-CM | POA: Insufficient documentation

## 2012-05-09 DIAGNOSIS — M25561 Pain in right knee: Secondary | ICD-10-CM

## 2012-05-09 MED ORDER — MORPHINE SULFATE ER 30 MG PO TBCR: 30.0000 mg | EXTENDED_RELEASE_TABLET | Freq: Once | ORAL | Status: DC

## 2012-05-09 MED ORDER — HYDROCODONE-ACETAMINOPHEN 7.5-300 MG PO TABS: 1.0000 | ORAL_TABLET | Freq: Four times a day (QID) | ORAL | Status: DC

## 2012-05-09 NOTE — Assessment & Plan Note (Signed)
Patient states he plans to get in touch with Dr. Thomasena Edis for knee replacement

## 2012-05-09 NOTE — Progress Notes (Signed)
Subjective:    Patient ID: Bobby Mimes Sr., male    DOB: March 05, 1946, 66 y.o.   MRN: [id number]  HPI  [name] is a pleasant 66 year old gentleman who is seen at our  clinic for multiple pain complaints, predominantly low back and right  knee pain. He has tricompartmental osteoarthritis of the right knee.  He has had 2 previous lumbar spine operations and a cervical spine  operation. The last lumbar surgery was back in 2005 by Dr. Danielle Henry, L2-3  diskectomy at that time. He continues to have pain predominantly in  both these areas averaging 7-8 on a scale of 10, described as sharp,  limiting activity rather significantly.    He does use a cane for  Ambulation.   Pain is constant throughout the day especially with  activity, improves with some rest. He gets some relief with current  Meds.   His #1 problem today is right knee pain. He's not had any falls.          Pain Inventory Average Pain 8 Pain Right Now 8 My pain is constant and sharp  In the last 24 hours, has pain interfered with the following? General activity 7 Relation with others 7 Enjoyment of life 7 What TIME of day is your pain at its worst? all the time Sleep (in general) Poor  Pain is worse with: walking, bending, sitting, inactivity and standing Pain improves with: rest and medication Relief from Meds: 3  Mobility use a cane how many minutes can you walk? 5 min ability to climb steps?  no do you drive?  yes Do you have any goals in this area?  yes  Function disabled: date disabled 66 Do you have any goals in this area?  yes  Neuro/Psych trouble walking  Prior Studies Any changes since last visit?  no  Physicians involved in your care Any changes since last visit?  no       Review of Systems  Constitutional: Positive for diaphoresis.       Blood sugar issues.  All other systems reviewed and are negative.       Objective:   Physical Exam  He is a thin male who  appears his stated age. He is oriented  x3. Speech is clear. Affect is bright. He is alert, cooperative, and  pleasant. Follows commands without difficulty. Answers my questions  appropriately.  NEUROLOGIC: Cranial nerves and coordination are intact. His reflexes  are diminished in the lower extremities. No abnormal tone, clonus, or  tremors are noted.  MUSCULOSKELETAL: Motor strength is 5/5 at hip flexors, knee extensors,  dorsiflexors, plantar flexors.  Straight leg raise is negative. He has some sensory deficits over the  left anterolateral thigh.  Transitions from sitting to standing relatively easily today. His gait  is stable, less antalgic and I have seen him in the past. He has  limitations in lumbar motion in all planes.   He is right quadriceps wasting. No effusion is appreciated. Plus medial and joint line tenderness. Crepitus with flexion extension. Hamstring tightness noted   He has a flexion  contracture at the right knee.          Assessment & Plan:  1. Lumbar spondylosisunderwent facet block 03/10/2012 Bobby Henry's  2. Lumbar postlaminectomy syndrome  3. Tricompartmental right knee osteoarthritis./Right knee pain.    Patient states he will be giving Dr. Thomasena Henry call to consider right knee replacement. He continues to use a cane for ambulation.  I have  suggested physical therapy to work on quadriceps strengthening and listening hamstrings, he declines.  Also consider pool therapy program.  He is trying to cut down on his cigarettes he is down to less than 4-5 cigarettes a day.  Refill the following medications:  MS Contin 30 mg one time per day Gabapentin 300 mg 1 per os 4 times a day he has 3 refills yet.  Hydrocondone/acetaminophen 7.5/300 one per os 4 times a day #120 per month  He has a history of diabetes Hyper cholesterolemia tobacco dependence hypertension, and he maintains contact with primary care  Physician assistant to followup over the next  2 months for refill of pain medication and monitoring  Bobby Henry to followup in 3 months     Pill counts are appropriate  Takes pain medications as prescribed

## 2012-05-09 NOTE — Patient Instructions (Signed)
Followup in one month for refill of medications  Please keep your pain medications locked up in a secure location  Consider physical therapy to work on right leg range of motion and strength.  Consider pool therapy.

## 2012-06-10 ENCOUNTER — Encounter: Payer: Self-pay | Admitting: Physical Medicine and Rehabilitation

## 2012-06-10 ENCOUNTER — Encounter
Payer: Medicare Other | Attending: Physical Medicine and Rehabilitation | Admitting: Physical Medicine and Rehabilitation

## 2012-06-10 VITALS — BP 185/85 | HR 71 | Resp 14 | Ht 67.0 in | Wt 149.0 lb

## 2012-06-10 DIAGNOSIS — M25561 Pain in right knee: Secondary | ICD-10-CM

## 2012-06-10 DIAGNOSIS — E119 Type 2 diabetes mellitus without complications: Secondary | ICD-10-CM | POA: Insufficient documentation

## 2012-06-10 DIAGNOSIS — M25569 Pain in unspecified knee: Secondary | ICD-10-CM

## 2012-06-10 DIAGNOSIS — Z86718 Personal history of other venous thrombosis and embolism: Secondary | ICD-10-CM | POA: Insufficient documentation

## 2012-06-10 DIAGNOSIS — M545 Low back pain, unspecified: Secondary | ICD-10-CM

## 2012-06-10 DIAGNOSIS — Z87891 Personal history of nicotine dependence: Secondary | ICD-10-CM | POA: Insufficient documentation

## 2012-06-10 DIAGNOSIS — G8929 Other chronic pain: Secondary | ICD-10-CM | POA: Insufficient documentation

## 2012-06-10 DIAGNOSIS — Z8673 Personal history of transient ischemic attack (TIA), and cerebral infarction without residual deficits: Secondary | ICD-10-CM | POA: Insufficient documentation

## 2012-06-10 DIAGNOSIS — R209 Unspecified disturbances of skin sensation: Secondary | ICD-10-CM | POA: Insufficient documentation

## 2012-06-10 DIAGNOSIS — I10 Essential (primary) hypertension: Secondary | ICD-10-CM | POA: Insufficient documentation

## 2012-06-10 DIAGNOSIS — M1710 Unilateral primary osteoarthritis, unspecified knee: Secondary | ICD-10-CM | POA: Insufficient documentation

## 2012-06-10 DIAGNOSIS — K219 Gastro-esophageal reflux disease without esophagitis: Secondary | ICD-10-CM | POA: Insufficient documentation

## 2012-06-10 DIAGNOSIS — M47817 Spondylosis without myelopathy or radiculopathy, lumbosacral region: Secondary | ICD-10-CM | POA: Insufficient documentation

## 2012-06-10 DIAGNOSIS — E785 Hyperlipidemia, unspecified: Secondary | ICD-10-CM | POA: Insufficient documentation

## 2012-06-10 DIAGNOSIS — M961 Postlaminectomy syndrome, not elsewhere classified: Secondary | ICD-10-CM | POA: Insufficient documentation

## 2012-06-10 MED ORDER — MORPHINE SULFATE ER 30 MG PO TBCR: 30.0000 mg | EXTENDED_RELEASE_TABLET | Freq: Once | ORAL | Status: DC

## 2012-06-10 MED ORDER — HYDROCODONE-ACETAMINOPHEN 7.5-300 MG PO TABS: 1.0000 | ORAL_TABLET | Freq: Four times a day (QID) | ORAL | Status: DC

## 2012-06-10 NOTE — Patient Instructions (Signed)
Continue with medication, continue with walking and exercising. Suggested exercising in a pool.

## 2012-06-10 NOTE — Progress Notes (Signed)
Subjective:    Patient ID: Bobby Mimes Sr., male    DOB: [birth year], 66 y.o.   MRN: [id number]  HPI The patient complains about chronic low back pain which radiates into the left lateral thigh to the knee.  The patient also complains about numbness and tingling in the same area.the patient also complains about right knee pain. The problem has been stable .  He has tricompartmental osteoarthritis of the right knee.  He has had 2 previous lumbar spine operations and a cervical spine  operation. The last lumbar surgery was back in 2005 by Dr. Danielle Dess, L2-3  diskectomy at that time.   Pain Inventory Average Pain 7 Pain Right Now 7 My pain is sharp and aching  In the last 24 hours, has pain interfered with the following? General activity 8 Relation with others 8 Enjoyment of life 8 What TIME of day is your pain at its worst? day, evening  Sleep (in general) Poor  Pain is worse with: walking, bending, sitting, inactivity and standing Pain improves with: medication Relief from Meds: 4  Mobility use a cane how many minutes can you walk? 5 ability to climb steps?  no do you drive?  yes Do you have any goals in this area?  yes  Function disabled: date disabled 49 Do you have any goals in this area?  no  Neuro/Psych trouble walking  Prior Studies Any changes since last visit?  no  Physicians involved in your care Any changes since last visit?  no   Family History  Problem Relation Age of Onset  . Heart attack Father     CABG 6v  . Arthritis Father   . Alzheimer's disease Mother   . Arthritis Mother   . Bipolar disorder Sister   . Bipolar disorder Sister   . Cancer Neg Hx   . Diabetes Neg Hx   . Stroke Neg Hx    History   Social History  . Marital Status: Married    Spouse Name: N/A    Number of Children: N/A  . Years of Education: N/A   Occupational History  . Retired/disabled    Social History Main Topics  . Smoking status: Current Some Day Smoker  -- 0.5 packs/day    Types: Cigarettes  . Smokeless tobacco: Never Used  . Alcohol Use: No  . Drug Use: No  . Sexually Active: None   Other Topics Concern  . 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 Nose surgery 1982  . Neck surgery 1984  . Knee surgery 1997  . Carotid endarterectomy 5/09    with dacron patch angioplasty with resection of redundant carotid artery with primary reanastomosis  . Esophagogastroduodenoscopy 2007    deformed pylorus; gastric ulcer  . Gastrojejunostomy 4/09    laparoscopic; and high selective anterior and posterior vagotomies  . Back surgery   . Cervical spine surgery   . Colonoscopy 2008    diverticulosis (Dr. Bosie Clos) repeat 2018   Past Medical History  Diagnosis Date  . HTN (hypertension)   . HLD (hyperlipidemia)   . DMII (diabetes mellitus, type 2)   . GERD (gastroesophageal reflux disease)   . PUD (peptic ulcer disease)   . Tobacco abuse   . Hx-TIA (transient ischemic attack) 2009    s/p CEA - Dr. Hart Rochester  .  Chronic low back pain   . Psychosexual dysfunction with inhibited sexual excitement   . Occlusion and stenosis of carotid artery without mention of cerebral infarction   . Osteoarthrosis, unspecified whether generalized or localized, other specified sites   . Other diseases of lung, not elsewhere classified     solitary pulmonary nodule (enlarged 2011)-follow up - stable on 2012 CT Delford Field)  . Embolism and thrombosis of unspecified site   . Diverticulosis    BP 185/85  Pulse 71  Resp 14  Ht 5\' 7"  (1.702 m)  Wt 149 lb (67.586 kg)  BMI 23.34 kg/m2  SpO2 99%     Review of Systems  All other systems reviewed and are negative.       Objective:   Physical Exam  Constitutional: He is oriented to person, place, and time. He appears well-developed and well-nourished.    HENT:  Head: Normocephalic.  Neck: Neck supple.  Musculoskeletal: He exhibits tenderness.  Neurological: He is alert and oriented to person, place, and time.  Skin: Skin is warm and dry.  Psychiatric: He has a normal mood and affect.    Symmetric normal motor tone is noted throughout. Normal muscle bulk, except atrophy in thigh muscles bilateral. Muscle testing reveals 5/5 muscle strength of the upper extremity, and 5/5 of the lower extremity. Full range of motion in upper and lower extremities, except decrease ROM in right knee, extension deficit 45 degrees.. ROM of spine is not restricted. Fine motor movements are normal in both hands. Sensory is intact and symmetric to light touch, pinprick and proprioception. DTR in the upper and lower extremity are present and symmetric 2+. No clonus is noted.  Patient arises from chair without difficulty. Wide based gait with a limp and with a cane. Able to stand on heels and toes . Tandem walk is possible with help, but  unstable. No pronator drift. Rhomberg negative.       Assessment & Plan:  1. Lumbar spondylosisunderwent facet block 03/10/2012 [facility]  2. Lumbar postlaminectomy syndrome  3. Tricompartmental right knee osteoarthritis./Right knee pain.  Patient states he will be giving Dr. Thomasena Edis call to consider right knee replacement. He continues to use a cane for ambulation.  I have suggested physical therapy to work on quadriceps strengthening and l hamstrings, he declines. I showed him exercises, that he can do at home to strengthen his thigh muscles. Also suggested  pool therapy program, but patient does not have access to a pool.

## 2012-07-10 ENCOUNTER — Encounter
Payer: Medicare Other | Attending: Physical Medicine and Rehabilitation | Admitting: Physical Medicine and Rehabilitation

## 2012-07-10 ENCOUNTER — Encounter: Payer: Self-pay | Admitting: Physical Medicine and Rehabilitation

## 2012-07-10 VITALS — BP 154/69 | HR 60 | Resp 14 | Ht 67.0 in | Wt 146.0 lb

## 2012-07-10 DIAGNOSIS — G8929 Other chronic pain: Secondary | ICD-10-CM | POA: Insufficient documentation

## 2012-07-10 DIAGNOSIS — M545 Low back pain, unspecified: Secondary | ICD-10-CM

## 2012-07-10 DIAGNOSIS — M961 Postlaminectomy syndrome, not elsewhere classified: Secondary | ICD-10-CM | POA: Insufficient documentation

## 2012-07-10 DIAGNOSIS — M25569 Pain in unspecified knee: Secondary | ICD-10-CM

## 2012-07-10 DIAGNOSIS — E119 Type 2 diabetes mellitus without complications: Secondary | ICD-10-CM | POA: Insufficient documentation

## 2012-07-10 DIAGNOSIS — F17200 Nicotine dependence, unspecified, uncomplicated: Secondary | ICD-10-CM | POA: Insufficient documentation

## 2012-07-10 DIAGNOSIS — M79605 Pain in left leg: Secondary | ICD-10-CM

## 2012-07-10 DIAGNOSIS — M47817 Spondylosis without myelopathy or radiculopathy, lumbosacral region: Secondary | ICD-10-CM | POA: Insufficient documentation

## 2012-07-10 DIAGNOSIS — K219 Gastro-esophageal reflux disease without esophagitis: Secondary | ICD-10-CM | POA: Insufficient documentation

## 2012-07-10 DIAGNOSIS — M25561 Pain in right knee: Secondary | ICD-10-CM

## 2012-07-10 DIAGNOSIS — R209 Unspecified disturbances of skin sensation: Secondary | ICD-10-CM | POA: Insufficient documentation

## 2012-07-10 DIAGNOSIS — Z8673 Personal history of transient ischemic attack (TIA), and cerebral infarction without residual deficits: Secondary | ICD-10-CM | POA: Insufficient documentation

## 2012-07-10 DIAGNOSIS — M1710 Unilateral primary osteoarthritis, unspecified knee: Secondary | ICD-10-CM | POA: Insufficient documentation

## 2012-07-10 DIAGNOSIS — E785 Hyperlipidemia, unspecified: Secondary | ICD-10-CM | POA: Insufficient documentation

## 2012-07-10 DIAGNOSIS — I10 Essential (primary) hypertension: Secondary | ICD-10-CM | POA: Insufficient documentation

## 2012-07-10 MED ORDER — HYDROCODONE-ACETAMINOPHEN 7.5-300 MG PO TABS: 1.0000 | ORAL_TABLET | Freq: Four times a day (QID) | ORAL | Status: DC

## 2012-07-10 MED ORDER — MORPHINE SULFATE ER 30 MG PO TBCR: 30.0000 mg | EXTENDED_RELEASE_TABLET | Freq: Once | ORAL | Status: DC

## 2012-07-10 NOTE — Patient Instructions (Signed)
Continue with walking and exercising

## 2012-07-10 NOTE — Progress Notes (Signed)
Subjective:    Patient ID: Bobby Mimes Sr., male    DOB: 1946-08-12, 66 y.o.   MRN: [id number]  HPI The patient complains about chronic low back pain which radiates into the left lateral thigh to the knee. The patient also complains about numbness and tingling in the same area.the patient also complains about right knee pain.  The problem has been stable .  He has tricompartmental osteoarthritis of the right knee.  He has had 2 previous lumbar spine operations and a cervical spine  operation. The last lumbar surgery was back in 2005 by Dr. Danielle Dess, L2-3  diskectomy at that time.  Pain Inventory Average Pain 7 Pain Right Now 7 My pain is constant, sharp, burning and aching  In the last 24 hours, has pain interfered with the following? General activity 7 Relation with others 7 Enjoyment of life 7 What TIME of day is your pain at its worst? all the time Sleep (in general) Poor  Pain is worse with: walking, bending, sitting, inactivity, standing and some activites Pain improves with: pacing activities and medication Relief from Meds: 6  Mobility use a cane how many minutes can you walk? 4-5 ability to climb steps?  no do you drive?  yes Do you have any goals in this area?  yes  Function disabled: date disabled 2007 Do you have any goals in this area?  yes  Neuro/Psych trouble walking  Prior Studies Any changes since last visit?  no  Physicians involved in your care Any changes since last visit?  no   Family History  Problem Relation Age of Onset  . Heart attack Father     CABG 6v  . Arthritis Father   . Alzheimer's disease Mother   . Arthritis Mother   . Bipolar disorder Sister   . Bipolar disorder Sister   . Cancer Neg Hx   . Diabetes Neg Hx   . Stroke Neg Hx    History   Social History  . Marital Status: Married    Spouse Name: N/A    Number of Children: N/A  . Years of Education: N/A   Occupational History  . Retired/disabled    Social  History Main Topics  . Smoking status: Current Some Day Smoker -- 0.5 packs/day    Types: Cigarettes  . Smokeless tobacco: Never Used  . Alcohol Use: No  . Drug Use: No  . Sexually Active: None   Other Topics Concern  . 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 Nose surgery 1982  . Neck surgery 1984  . Knee surgery 1997  . Carotid endarterectomy 5/09    with dacron patch angioplasty with resection of redundant carotid artery with primary reanastomosis  . Esophagogastroduodenoscopy 2007    deformed pylorus; gastric ulcer  . Gastrojejunostomy 4/09    laparoscopic; and high selective anterior and posterior vagotomies  . Back surgery   . Cervical spine surgery   . Colonoscopy 2008    diverticulosis (Dr. Bosie Clos) repeat 2018   Past Medical History  Diagnosis Date  . HTN (hypertension)   . HLD (hyperlipidemia)   . DMII (diabetes mellitus, type 2)   . GERD (gastroesophageal reflux disease)   . PUD (peptic ulcer disease)   . Tobacco abuse   . Hx-TIA (transient ischemic attack) 2009    s/p  CEA - Dr. Hart Rochester  . Chronic low back pain   . Psychosexual dysfunction with inhibited sexual excitement   . Occlusion and stenosis of carotid artery without mention of cerebral infarction   . Osteoarthrosis, unspecified whether generalized or localized, other specified sites   . Other diseases of lung, not elsewhere classified     solitary pulmonary nodule (enlarged 2011)-follow up - stable on 2012 CT Delford Field)  . Embolism and thrombosis of unspecified site   . Diverticulosis    BP 154/69  Pulse 60  Resp 14  Ht 5\' 7"  (1.702 m)  Wt 146 lb (66.225 kg)  BMI 22.87 kg/m2  SpO2 97%     Review of Systems  Musculoskeletal: Positive for back pain and gait problem.  All other systems reviewed and are negative.       Objective:    Physical Exam Constitutional: He is oriented to person, place, and time. He appears well-developed and well-nourished.  HENT:  Head: Normocephalic.  Neck: Neck supple.  Musculoskeletal: He exhibits tenderness.  Neurological: He is alert and oriented to person, place, and time.  Skin: Skin is warm and dry.  Psychiatric: He has a normal mood and affect.   Symmetric normal motor tone is noted throughout. Normal muscle bulk, except atrophy in thigh muscles bilateral. Muscle testing reveals 5/5 muscle strength of the upper extremity, and 5/5 of the lower extremity. Full range of motion in upper and lower extremities, except decrease ROM in right knee, extension deficit 45 degrees.. ROM of spine is not restricted. Fine motor movements are normal in both hands.  Sensory is intact and symmetric to light touch, pinprick and proprioception.  DTR in the upper and lower extremity are present and symmetric 2+. No clonus is noted.  Patient arises from chair without difficulty. Wide based gait with a limp and with a cane. Able to stand on heels and toes . Tandem walk is possible with help, but unstable. No pronator drift. Rhomberg negative.         Assessment & Plan:  1. Lumbar spondylosisunderwent facet block 03/10/2012 [facility]  2. Lumbar postlaminectomy syndrome  3. Tricompartmental right knee osteoarthritis./Right knee pain.  Patient states he will be giving Dr. Thomasena Edis call to consider right knee replacement. He continues to use a cane for ambulation.  I have suggested physical therapy to work on quadriceps strengthening and l hamstrings, he declines. I showed him exercises, that he can do at home to strengthen his thigh muscles.  Also suggested pool therapy program, but patient does not have access to a pool.

## 2012-08-11 ENCOUNTER — Encounter: Payer: Self-pay | Admitting: Physical Medicine and Rehabilitation

## 2012-08-11 ENCOUNTER — Encounter
Payer: Medicare Other | Attending: Physical Medicine and Rehabilitation | Admitting: Physical Medicine and Rehabilitation

## 2012-08-11 ENCOUNTER — Other Ambulatory Visit: Payer: Self-pay | Admitting: Physical Medicine and Rehabilitation

## 2012-08-11 VITALS — BP 164/90 | HR 66 | Ht 67.0 in | Wt 144.0 lb

## 2012-08-11 DIAGNOSIS — G8929 Other chronic pain: Secondary | ICD-10-CM | POA: Insufficient documentation

## 2012-08-11 DIAGNOSIS — IMO0002 Reserved for concepts with insufficient information to code with codable children: Secondary | ICD-10-CM | POA: Insufficient documentation

## 2012-08-11 DIAGNOSIS — M25561 Pain in right knee: Secondary | ICD-10-CM

## 2012-08-11 DIAGNOSIS — M549 Dorsalgia, unspecified: Secondary | ICD-10-CM | POA: Insufficient documentation

## 2012-08-11 DIAGNOSIS — M25569 Pain in unspecified knee: Secondary | ICD-10-CM | POA: Insufficient documentation

## 2012-08-11 DIAGNOSIS — M1710 Unilateral primary osteoarthritis, unspecified knee: Secondary | ICD-10-CM

## 2012-08-11 DIAGNOSIS — M1711 Unilateral primary osteoarthritis, right knee: Secondary | ICD-10-CM

## 2012-08-11 DIAGNOSIS — Z5181 Encounter for therapeutic drug level monitoring: Secondary | ICD-10-CM | POA: Insufficient documentation

## 2012-08-11 MED ORDER — HYDROCODONE-ACETAMINOPHEN 7.5-300 MG PO TABS: 1.0000 | ORAL_TABLET | Freq: Four times a day (QID) | ORAL | Status: DC

## 2012-08-11 MED ORDER — MORPHINE SULFATE ER 30 MG PO TBCR: 30.0000 mg | EXTENDED_RELEASE_TABLET | Freq: Once | ORAL | Status: DC

## 2012-08-11 NOTE — Progress Notes (Signed)
Subjective:    Patient ID: Bobby Mimes Sr., male    DOB: 1946/06/14, 66 y.o.   MRN: [id number]  HPIMr. Bobby Henry is a pleasant 66 year old gentleman who is seen at our  clinic for multiple pain complaints, predominantly low back and right  knee pain.   He has tricompartmental osteoarthritis of the right knee.    He has had 2 previous lumbar spine operations and a cervical spine  operation.   The last lumbar surgery was back in 2005 by Dr. Danielle Henry, L2-3  diskectomy at that time.  He continues to have pain predominantly in  both these areas averaging 7-8 on a scale of 10, described as sharp,  limiting activity rather significantly.   He does use a cane for Ambulation.   Pain is constant throughout the day especially with  activity, improves with some rest. He gets some relief with current medications.  No headache, no chest pain   Pain Inventory Average Pain 7 Pain Right Now 7 My pain is constant, sharp, burning and aching  In the last 24 hours, has pain interfered with the following? General activity 7 Relation with others 7 Enjoyment of life 7 What TIME of day is your pain at its worst? all of the time Sleep (in general) Poor  Pain is worse with: walking, bending, sitting, inactivity and standing Pain improves with: rest and medication Relief from Meds: 7  Mobility use a cane how many minutes can you walk? 5 ability to climb steps?  no do you drive?  yes  Function disabled: date disabled 2002  Neuro/Psych No problems in this area  Prior Studies Any changes since last visit?  no  Physicians involved in your care Any changes since last visit?  no   Family History  Problem Relation Age of Onset  . Heart attack Father     CABG 6v  . Arthritis Father   . Alzheimer's disease Mother   . Arthritis Mother   . Bipolar disorder Sister   . Bipolar disorder Sister   . Cancer Neg Hx   . Diabetes Neg Hx   . Stroke Neg Hx    History   Social History    . Marital Status: Married    Spouse Name: N/A    Number of Children: N/A  . Years of Education: N/A   Occupational History  . Retired/disabled    Social History Main Topics  . Smoking status: Current Some Day Smoker -- 0.5 packs/day    Types: Cigarettes  . Smokeless tobacco: Never Used  . Alcohol Use: No  . Drug Use: No  . Sexually Active: None   Other Topics Concern  . 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 Nose surgery 1982  . Neck surgery 1984  . Knee surgery 1997  . Carotid endarterectomy 5/09    with dacron patch angioplasty with resection of redundant carotid artery with primary reanastomosis  . Esophagogastroduodenoscopy 2007    deformed pylorus; gastric ulcer  . Gastrojejunostomy 4/09    laparoscopic; and high selective anterior and posterior vagotomies  . Back surgery   . Cervical spine surgery   . Colonoscopy 2008    diverticulosis (Dr. Bosie Henry) repeat 2018   Past Medical History  Diagnosis Date  . HTN (hypertension)   . HLD (hyperlipidemia)   . DMII (  diabetes mellitus, type 2)   . GERD (gastroesophageal reflux disease)   . PUD (peptic ulcer disease)   . Tobacco abuse   . Hx-TIA (transient ischemic attack) 2009    s/p CEA - Dr. Hart Henry  . Chronic low back pain   . Psychosexual dysfunction with inhibited sexual excitement   . Occlusion and stenosis of carotid artery without mention of cerebral infarction   . Osteoarthrosis, unspecified whether generalized or localized, other specified sites   . Other diseases of lung, not elsewhere classified     solitary pulmonary nodule (enlarged 2011)-follow up - stable on 2012 CT Bobby Henry)  . Embolism and thrombosis of unspecified site   . Diverticulosis    BP 164/90  Pulse 66  Ht 5\' 7"  (1.702 m)  Wt 144 lb (65.318 kg)  BMI 22.55 kg/m2  SpO2  97%    Review of Systems  Musculoskeletal: Positive for back pain.  All other systems reviewed and are negative.       Objective:   Physical Exam He is a thin male who appears his stated age. He is oriented  x3. Speech is clear. Affect is bright. He is alert, cooperative, and  pleasant. Follows commands without difficulty. Answers my questions  appropriately.   NEUROLOGIC: Cranial nerves and coordination are intact.   His reflexes are diminished in the lower extremities. No abnormal tone, clonus, or  tremors are noted.   MUSCULOSKELETAL: Motor strength is 5/5 at hip flexors, knee extensors,  dorsiflexors, plantar flexors.   Straight leg raise is negative. He has some sensory deficits over the  left anterolateral thigh.   Transitions from sitting to standing relatively easily today.  His gait is stable, less antalgic and I have seen him in the past. He has  limitations in lumbar motion in all planes. Extension is mostly affected.  Less painful to day with flexion.   He has right quadriceps wasting. No effusion is appreciated. Medial and joint line tenderness noted. Crepitus with flexion extension. Hamstring tightness noted  He has a flexion  contracture at the right knee.         Assessment & Plan:  1. Lumbar spondylosis underwent facet block 03/10/2012 Bobby Henry's   2. Lumbar postlaminectomy syndrome   3. Tricompartmental right knee osteoarthritis./Right knee pain.    Patient states he will be giving Dr. Thomasena Henry call to consider right knee replacement. He continues to use a cane for ambulation.    I have suggested physical therapy to work on quadriceps strengthening and loosening hamstrings, pt is interested today.  Also consider pool therapy program.  He is trying to cut down on his cigarettes he is down to less than 4-5 cigarettes a day.  Refill the following medications:  MS Contin 30 mg one time per day  Gabapentin 300 mg 1 per os 4 times a day he has 3  refills yet.  Hydrocondone/acetaminophen 7.5/300 one per os 4 times a day #120 per month    He has a history of diabetes, hypercholesterolemia, tobacco dependence, hypertension, and he maintains contact with primary care    Physician assistant to followup over the next 2 months for refill of pain medication and monitoring    Bobby Henry to followup in 3 months    Pill counts are appropriate  Takes pain medications as prescribed  [place] controlled substance reporting system accessed today 08/11/2012 no evidence of other prescribers noted or evidence of aberrant activity   Urine drug screen today we'll check at next visit.

## 2012-08-11 NOTE — Patient Instructions (Addendum)
Your blood pressure is elevated.  Make sure you are taking your blood pressure medication properly.  Recheck your blood pressure, notify your primary care doctor it remains elevated.  You have mentioned you are planning to make an appointment with Dr. Thomasena Edis in the next few days.   Please take your pain medications as prescribed.  Keep your pain medications locked up in a secure location.  Follow back up in clinic in one month, bring your pill bottles in as usual so we can continue to monitor your pain medication use.  I have ordered physical therapy to help you strengthen your right leg.  You should not participate in physical therapy if your blood pressure is elevated.

## 2012-09-03 ENCOUNTER — Other Ambulatory Visit: Payer: Medicare Other

## 2012-09-08 ENCOUNTER — Encounter
Payer: Medicare Other | Attending: Physical Medicine and Rehabilitation | Admitting: Physical Medicine and Rehabilitation

## 2012-09-08 ENCOUNTER — Encounter: Payer: Self-pay | Admitting: Physical Medicine and Rehabilitation

## 2012-09-08 VITALS — BP 167/90 | HR 63 | Resp 16 | Ht 68.0 in | Wt 146.0 lb

## 2012-09-08 DIAGNOSIS — M1710 Unilateral primary osteoarthritis, unspecified knee: Secondary | ICD-10-CM | POA: Insufficient documentation

## 2012-09-08 DIAGNOSIS — G8929 Other chronic pain: Secondary | ICD-10-CM | POA: Insufficient documentation

## 2012-09-08 DIAGNOSIS — M25561 Pain in right knee: Secondary | ICD-10-CM

## 2012-09-08 DIAGNOSIS — M25569 Pain in unspecified knee: Secondary | ICD-10-CM | POA: Insufficient documentation

## 2012-09-08 DIAGNOSIS — M545 Low back pain: Secondary | ICD-10-CM

## 2012-09-08 DIAGNOSIS — M961 Postlaminectomy syndrome, not elsewhere classified: Secondary | ICD-10-CM | POA: Insufficient documentation

## 2012-09-08 DIAGNOSIS — M47817 Spondylosis without myelopathy or radiculopathy, lumbosacral region: Secondary | ICD-10-CM | POA: Insufficient documentation

## 2012-09-08 MED ORDER — HYDROCODONE-ACETAMINOPHEN 7.5-300 MG PO TABS: 1.0000 | ORAL_TABLET | Freq: Four times a day (QID) | ORAL | Status: DC

## 2012-09-08 MED ORDER — MORPHINE SULFATE ER 30 MG PO TBCR: 30.0000 mg | EXTENDED_RELEASE_TABLET | Freq: Once | ORAL | Status: DC

## 2012-09-08 NOTE — Progress Notes (Signed)
Subjective:    Patient ID: Bobby Mimes Sr., male    DOB: 03/07/1946, 66 y.o.   MRN: [id number]  HPI The patient complains about chronic low back pain which radiates into the left lateral thigh to the knee. The patient also complains about numbness and tingling in the same area.the patient also complains about right knee pain.  The problem has been stable .  He has tricompartmental osteoarthritis of the right knee.  He has had 2 previous lumbar spine operations and a cervical spine  operation. The last lumbar surgery was back in 2005 by Dr. Danielle Dess, L2-3  Diskectomy. The patient will see Dr. Thomasena Edis for a possible right knee replacement next week.  Pain Inventory Average Pain 8 Pain Right Now 7 My pain is constant, sharp, burning and stabbing  In the last 24 hours, has pain interfered with the following? General activity 8 Relation with others 9 Enjoyment of life 8 What TIME of day is your pain at its worst? All DAy Sleep (in general) Poor  Pain is worse with: walking, bending, sitting and standing Pain improves with: medication Relief from Meds: 5  Mobility use a cane ability to climb steps?  no do you drive?  yes  Function disabled: date disabled 31  Neuro/Psych trouble walking  Prior Studies Any changes since last visit?  no  Physicians involved in your care Any changes since last visit?  no   Family History  Problem Relation Age of Onset  . Heart attack Father     CABG 6v  . Arthritis Father   . Alzheimer's disease Mother   . Arthritis Mother   . Bipolar disorder Sister   . Bipolar disorder Sister   . Cancer Neg Hx   . Diabetes Neg Hx   . Stroke Neg Hx    History   Social History  . Marital Status: Married    Spouse Name: N/A    Number of Children: N/A  . Years of Education: N/A   Occupational History  . Retired/disabled    Social History Main Topics  . Smoking status: Current Some Day Smoker -- 0.5 packs/day    Types: Cigarettes  .  Smokeless tobacco: Never Used  . Alcohol Use: No  . Drug Use: No  . Sexually Active: None   Other Topics Concern  . 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 Nose surgery 1982  . Neck surgery 1984  . Knee surgery 1997  . Carotid endarterectomy 5/09    with dacron patch angioplasty with resection of redundant carotid artery with primary reanastomosis  . Esophagogastroduodenoscopy 2007    deformed pylorus; gastric ulcer  . Gastrojejunostomy 4/09    laparoscopic; and high selective anterior and posterior vagotomies  . Back surgery   . Cervical spine surgery   . Colonoscopy 2008    diverticulosis (Dr. Bosie Clos) repeat 2018   Past Medical History  Diagnosis Date  . HTN (hypertension)   . HLD (hyperlipidemia)   . DMII (diabetes mellitus, type 2)   . GERD (gastroesophageal reflux disease)   . PUD (peptic ulcer disease)   . Tobacco abuse   . Hx-TIA (transient ischemic attack) 2009    s/p CEA - Dr. Hart Rochester  . Chronic low back pain   . Psychosexual dysfunction with inhibited sexual excitement   . Occlusion  and stenosis of carotid artery without mention of cerebral infarction   . Osteoarthrosis, unspecified whether generalized or localized, other specified sites   . Other diseases of lung, not elsewhere classified     solitary pulmonary nodule (enlarged 2011)-follow up - stable on 2012 CT Delford Field)  . Embolism and thrombosis of unspecified site   . Diverticulosis    BP 167/90  Pulse 63  Resp 16  Ht 5\' 8"  (1.727 m)  Wt 146 lb (66.225 kg)  BMI 22.20 kg/m2  SpO2 97%      Review of Systems  Constitutional: Negative.   HENT: Negative.   Eyes: Negative.   Respiratory: Negative.   Cardiovascular: Negative.   Gastrointestinal: Negative.   Genitourinary: Negative.   Musculoskeletal: Positive for back pain and gait problem.   Skin: Negative.   Neurological: Negative.   Hematological: Negative.   Psychiatric/Behavioral: Negative.        Objective:   Physical Exam Constitutional: He is oriented to person, place, and time. He appears well-developed and well-nourished.  HENT:  Head: Normocephalic.  Neck: Neck supple.  Musculoskeletal: He exhibits tenderness.  Neurological: He is alert and oriented to person, place, and time.  Skin: Skin is warm and dry.  Psychiatric: He has a normal mood and affect.  Symmetric normal motor tone is noted throughout. Normal muscle bulk, except atrophy in thigh muscles bilateral. Muscle testing reveals 5/5 muscle strength of the upper extremity, and 5/5 of the lower extremity. Full range of motion in upper and lower extremities, except decrease ROM in right knee, extension deficit 45 degrees.. ROM of spine is not restricted. Fine motor movements are normal in both hands.  Sensory is intact and symmetric to light touch, pinprick and proprioception.  DTR in the upper and lower extremity are present and symmetric 2+. No clonus is noted.  Patient arises from chair without difficulty. Wide based gait with a limp and with a cane. Able to stand on heels and toes . Tandem walk is possible with help, but unstable. No pronator drift. Rhomberg negative        Assessment & Plan:  1. Lumbar spondylosisunderwent facet block 03/10/2012 [facility]  2. Lumbar postlaminectomy syndrome  3. Tricompartmental right knee osteoarthritis./Right knee pain.Patient will see Dr. Thomasena Edis a orth. Surgeon next week.   He continues to use a cane for ambulation.  I have suggested physical therapy to work on quadriceps strengthening and l hamstrings, he declines. I showed him exercises, that he can do at home to strengthen his thigh muscles.  Also suggested pool therapy program, but patient does not have access to a pool. The patient's last UDS was negative for opioids, he states, that he ran out off his  medication 2 days early, also the test was evaluated one day after he gave his sample. I educated the patient, that he should take the medication as prescribed, and if this happens again, we would not prescribe narcotics any more. The patient understood and agreed. Refilled his MS-Contin and Hydrocodone today. Follow up in one month.

## 2012-09-08 NOTE — Patient Instructions (Signed)
Continue with staying as active as tolerated 

## 2012-09-10 ENCOUNTER — Other Ambulatory Visit (INDEPENDENT_AMBULATORY_CARE_PROVIDER_SITE_OTHER): Payer: Medicare Other | Admitting: *Deleted

## 2012-09-10 DIAGNOSIS — I6529 Occlusion and stenosis of unspecified carotid artery: Secondary | ICD-10-CM

## 2012-09-10 DIAGNOSIS — Z48812 Encounter for surgical aftercare following surgery on the circulatory system: Secondary | ICD-10-CM

## 2012-09-16 ENCOUNTER — Other Ambulatory Visit: Payer: Self-pay | Admitting: *Deleted

## 2012-09-16 DIAGNOSIS — I6529 Occlusion and stenosis of unspecified carotid artery: Secondary | ICD-10-CM

## 2012-09-16 DIAGNOSIS — Z48812 Encounter for surgical aftercare following surgery on the circulatory system: Secondary | ICD-10-CM

## 2012-09-22 ENCOUNTER — Encounter: Payer: Self-pay | Admitting: Vascular Surgery

## 2012-10-08 ENCOUNTER — Encounter
Payer: Medicare Other | Attending: Physical Medicine and Rehabilitation | Admitting: Physical Medicine and Rehabilitation

## 2012-10-08 ENCOUNTER — Encounter: Payer: Self-pay | Admitting: Physical Medicine and Rehabilitation

## 2012-10-08 ENCOUNTER — Other Ambulatory Visit: Payer: Self-pay | Admitting: Family Medicine

## 2012-10-08 VITALS — BP 155/90 | HR 64 | Resp 14 | Ht 68.0 in | Wt 141.0 lb

## 2012-10-08 DIAGNOSIS — Z8673 Personal history of transient ischemic attack (TIA), and cerebral infarction without residual deficits: Secondary | ICD-10-CM | POA: Insufficient documentation

## 2012-10-08 DIAGNOSIS — M545 Low back pain, unspecified: Secondary | ICD-10-CM | POA: Insufficient documentation

## 2012-10-08 DIAGNOSIS — E119 Type 2 diabetes mellitus without complications: Secondary | ICD-10-CM | POA: Insufficient documentation

## 2012-10-08 DIAGNOSIS — E785 Hyperlipidemia, unspecified: Secondary | ICD-10-CM | POA: Insufficient documentation

## 2012-10-08 DIAGNOSIS — M25561 Pain in right knee: Secondary | ICD-10-CM

## 2012-10-08 DIAGNOSIS — I10 Essential (primary) hypertension: Secondary | ICD-10-CM | POA: Insufficient documentation

## 2012-10-08 DIAGNOSIS — G8929 Other chronic pain: Secondary | ICD-10-CM | POA: Insufficient documentation

## 2012-10-08 DIAGNOSIS — M961 Postlaminectomy syndrome, not elsewhere classified: Secondary | ICD-10-CM | POA: Insufficient documentation

## 2012-10-08 DIAGNOSIS — F17200 Nicotine dependence, unspecified, uncomplicated: Secondary | ICD-10-CM | POA: Insufficient documentation

## 2012-10-08 DIAGNOSIS — M1710 Unilateral primary osteoarthritis, unspecified knee: Secondary | ICD-10-CM | POA: Insufficient documentation

## 2012-10-08 DIAGNOSIS — M25569 Pain in unspecified knee: Secondary | ICD-10-CM

## 2012-10-08 DIAGNOSIS — R209 Unspecified disturbances of skin sensation: Secondary | ICD-10-CM | POA: Insufficient documentation

## 2012-10-08 DIAGNOSIS — M47817 Spondylosis without myelopathy or radiculopathy, lumbosacral region: Secondary | ICD-10-CM | POA: Insufficient documentation

## 2012-10-08 DIAGNOSIS — Z86718 Personal history of other venous thrombosis and embolism: Secondary | ICD-10-CM | POA: Insufficient documentation

## 2012-10-08 DIAGNOSIS — K219 Gastro-esophageal reflux disease without esophagitis: Secondary | ICD-10-CM | POA: Insufficient documentation

## 2012-10-08 MED ORDER — HYDROCODONE-ACETAMINOPHEN 7.5-300 MG PO TABS: 1.0000 | ORAL_TABLET | Freq: Four times a day (QID) | ORAL | Status: DC

## 2012-10-08 MED ORDER — MORPHINE SULFATE ER 30 MG PO TBCR: 30.0000 mg | EXTENDED_RELEASE_TABLET | Freq: Once | ORAL | Status: DC

## 2012-10-08 NOTE — Progress Notes (Signed)
Subjective:    Patient ID: Bobby Mimes Sr., male    DOB: 1946/07/12, 66 y.o.   MRN: [id number]  HPI The patient complains about chronic low back pain which radiates into the left lateral thigh to the knee. The patient also complains about numbness and tingling in the same area.the patient also complains about right knee pain.  The problem has been stable .  He has tricompartmental osteoarthritis of the right knee.  He has had 2 previous lumbar spine operations and a cervical spine  operation. The last lumbar surgery was back in 2005 by Dr. Danielle Dess, L2-3  Diskectomy. The patient has seen Dr. Thomasena Edis, who recommended a right knee replacement.   Pain Inventory Average Pain 7 Pain Right Now 6 My pain is sharp and burning  In the last 24 hours, has pain interfered with the following? General activity 7 Relation with others 7 Enjoyment of life 7 What TIME of day is your pain at its worst? All Day Sleep (in general) Poor  Pain is worse with: walking, bending, sitting and standing Pain improves with: medication Relief from Meds: 5  Mobility walk with assistance use a cane ability to climb steps?  no do you drive?  yes  Function retired  Neuro/Psych No problems in this area  Prior Studies Any changes since last visit?  no  Physicians involved in your care Any changes since last visit?  no   Family History  Problem Relation Age of Onset  . Heart attack Father     CABG 6v  . Arthritis Father   . Alzheimer's disease Mother   . Arthritis Mother   . Bipolar disorder Sister   . Bipolar disorder Sister   . Cancer Neg Hx   . Diabetes Neg Hx   . Stroke Neg Hx    History   Social History  . Marital Status: Married    Spouse Name: N/A    Number of Children: N/A  . Years of Education: N/A   Occupational History  . Retired/disabled    Social History Main Topics  . Smoking status: Current Some Day Smoker -- 0.5 packs/day    Types: Cigarettes  . Smokeless tobacco:  Never Used  . Alcohol Use: No  . Drug Use: No  . Sexually Active: None   Other Topics Concern  . 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 Nose surgery 1982  . Neck surgery 1984  . Knee surgery 1997  . Carotid endarterectomy 5/09    with dacron patch angioplasty with resection of redundant carotid artery with primary reanastomosis  . Esophagogastroduodenoscopy 2007    deformed pylorus; gastric ulcer  . Gastrojejunostomy 4/09    laparoscopic; and high selective anterior and posterior vagotomies  . Back surgery   . Cervical spine surgery   . Colonoscopy 2008    diverticulosis (Dr. Bosie Clos) repeat 2018   Past Medical History  Diagnosis Date  . HTN (hypertension)   . HLD (hyperlipidemia)   . DMII (diabetes mellitus, type 2)   . GERD (gastroesophageal reflux disease)   . PUD (peptic ulcer disease)   . Tobacco abuse   . Hx-TIA (transient ischemic attack) 2009    s/p CEA - Dr. Hart Rochester  . Chronic low back pain   . Psychosexual dysfunction with inhibited sexual excitement   . Occlusion  and stenosis of carotid artery without mention of cerebral infarction   . Osteoarthrosis, unspecified whether generalized or localized, other specified sites   . Other diseases of lung, not elsewhere classified     solitary pulmonary nodule (enlarged 2011)-follow up - stable on 2012 CT Delford Field)  . Embolism and thrombosis of unspecified site   . Diverticulosis    BP 155/90  Pulse 64  Resp 14  Ht 5\' 8"  (1.727 m)  Wt 141 lb (63.957 kg)  BMI 21.44 kg/m2  SpO2 98%      Review of Systems  Constitutional: Negative.   HENT: Negative.   Eyes: Negative.   Respiratory: Negative.   Cardiovascular: Negative.   Gastrointestinal: Negative.   Genitourinary: Negative.   Musculoskeletal: Positive for myalgias, back pain and arthralgias.  Skin:  Negative.   Neurological: Negative.   Hematological: Negative.   Psychiatric/Behavioral: Negative.        Objective:   Physical Exam Constitutional: He is oriented to person, place, and time. He appears well-developed and well-nourished.  HENT:  Head: Normocephalic.  Neck: Neck supple.  Musculoskeletal: He exhibits tenderness.  Neurological: He is alert and oriented to person, place, and time.  Skin: Skin is warm and dry.  Psychiatric: He has a normal mood and affect.  Symmetric normal motor tone is noted throughout. Normal muscle bulk, except atrophy in thigh muscles bilateral. Muscle testing reveals 5/5 muscle strength of the upper extremity, and 5/5 of the lower extremity. Full range of motion in upper and lower extremities, except decrease ROM in right knee, extension deficit 45 degrees.. ROM of spine is not restricted. Fine motor movements are normal in both hands.  Sensory is intact and symmetric to light touch, pinprick and proprioception.  DTR in the upper and lower extremity are present and symmetric 2+. No clonus is noted.  Patient arises from chair without difficulty. Wide based gait with a limp and with a cane. Able to stand on heels and toes . Tandem walk is possible with help, but unstable. No pronator drift. Rhomberg negative        Assessment & Plan:  1. Lumbar spondylosisunderwent facet block 03/10/2012 [facility]  2. Lumbar postlaminectomy syndrome  3. Tricompartmental right knee osteoarthritis./Right knee pain.Patient has seen Dr. Thomasena Edis a orth. Surgeon, who recommended a TKR.  He continues to use a cane for ambulation.  I have suggested physical therapy to work on quadriceps strengthening and l hamstrings, he declines. I showed him exercises, that he can do at home to strengthen his thigh muscles.  Also suggested pool therapy program, but patient does not have access to a pool.  The patient's last UDS was negative for opioids, he states, that he ran out off his  medication 2 days early, also the test was evaluated one day after he gave his sample. I educated the patient, that he should take the medication as prescribed, and if this happens again, we would not prescribe narcotics any more. The patient understood and agreed.  Refilled his MS-Contin and Hydrocodone today.  Follow up in one month.

## 2012-10-08 NOTE — Patient Instructions (Signed)
Continue with walking program...

## 2012-11-10 ENCOUNTER — Encounter
Payer: Medicare Other | Attending: Physical Medicine and Rehabilitation | Admitting: Physical Medicine and Rehabilitation

## 2012-11-10 ENCOUNTER — Encounter: Payer: Self-pay | Admitting: Physical Medicine and Rehabilitation

## 2012-11-10 VITALS — BP 169/91 | HR 70 | Resp 17 | Ht 66.0 in | Wt 153.0 lb

## 2012-11-10 DIAGNOSIS — M545 Low back pain, unspecified: Secondary | ICD-10-CM | POA: Insufficient documentation

## 2012-11-10 DIAGNOSIS — M1710 Unilateral primary osteoarthritis, unspecified knee: Secondary | ICD-10-CM | POA: Insufficient documentation

## 2012-11-10 DIAGNOSIS — R209 Unspecified disturbances of skin sensation: Secondary | ICD-10-CM | POA: Insufficient documentation

## 2012-11-10 DIAGNOSIS — M25569 Pain in unspecified knee: Secondary | ICD-10-CM | POA: Insufficient documentation

## 2012-11-10 DIAGNOSIS — M47817 Spondylosis without myelopathy or radiculopathy, lumbosacral region: Secondary | ICD-10-CM | POA: Insufficient documentation

## 2012-11-10 DIAGNOSIS — M25561 Pain in right knee: Secondary | ICD-10-CM

## 2012-11-10 DIAGNOSIS — G8929 Other chronic pain: Secondary | ICD-10-CM | POA: Insufficient documentation

## 2012-11-10 DIAGNOSIS — M79609 Pain in unspecified limb: Secondary | ICD-10-CM | POA: Insufficient documentation

## 2012-11-10 DIAGNOSIS — M961 Postlaminectomy syndrome, not elsewhere classified: Secondary | ICD-10-CM | POA: Insufficient documentation

## 2012-11-10 MED ORDER — HYDROCODONE-ACETAMINOPHEN 7.5-300 MG PO TABS: 1.0000 | ORAL_TABLET | Freq: Four times a day (QID) | ORAL | Status: DC

## 2012-11-10 MED ORDER — MORPHINE SULFATE ER 30 MG PO TBCR: 30.0000 mg | EXTENDED_RELEASE_TABLET | Freq: Once | ORAL | Status: DC

## 2012-11-10 NOTE — Progress Notes (Signed)
Subjective:    Patient ID: Bobby Mimes Sr., male    DOB: 10/10/1946, 66 y.o.   MRN: [id number]  HPI The patient complains about chronic low back pain which radiates into the left lateral thigh to the knee. The patient also complains about numbness and tingling in the same area.the patient also complains about right knee pain.  The problem has been stable .  He has tricompartmental osteoarthritis of the right knee.  He has had 2 previous lumbar spine operations and a cervical spine  operation. The last lumbar surgery was back in 2005 by Dr. Danielle Dess, L2-3  Diskectomy. The patient has seen Dr. Enid Baas PA, who recommended a right knee replacement, the patient's wife had lumbar surgery, and the patient has not followed up on it yet.   Pain Inventory Average Pain 7 Pain Right Now 7 My pain is sharp and aching  In the last 24 hours, has pain interfered with the following? General activity 7 Relation with others 7 Enjoyment of life 7 What TIME of day is your pain at its worst? constant Sleep (in general) Poor  Pain is worse with: walking, bending, sitting, inactivity and standing Pain improves with: medication Relief from Meds: 5  Mobility use a cane how many minutes can you walk? 5-10 ability to climb steps?  no do you drive?  yes  Function disabled: date disabled   Neuro/Psych trouble walking  Prior Studies Any changes since last visit?  no  Physicians involved in your care Any changes since last visit?  no   Family History  Problem Relation Age of Onset  . Heart attack Father     CABG 6v  . Arthritis Father   . Alzheimer's disease Mother   . Arthritis Mother   . Bipolar disorder Sister   . Bipolar disorder Sister   . Cancer Neg Hx   . Diabetes Neg Hx   . Stroke Neg Hx    History   Social History  . Marital Status: Married    Spouse Name: N/A    Number of Children: N/A  . Years of Education: N/A   Occupational History  . Retired/disabled    Social  History Main Topics  . Smoking status: Current Some Day Smoker -- 0.5 packs/day    Types: Cigarettes  . Smokeless tobacco: Never Used  . Alcohol Use: No  . Drug Use: No  . Sexually Active: None   Other Topics Concern  . 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 Nose surgery 1982  . Neck surgery 1984  . Knee surgery 1997  . Carotid endarterectomy 5/09    with dacron patch angioplasty with resection of redundant carotid artery with primary reanastomosis  . Esophagogastroduodenoscopy 2007    deformed pylorus; gastric ulcer  . Gastrojejunostomy 4/09    laparoscopic; and high selective anterior and posterior vagotomies  . Back surgery   . Cervical spine surgery   . Colonoscopy 2008    diverticulosis (Dr. Bosie Clos) repeat 2018   Past Medical History  Diagnosis Date  . HTN (hypertension)   . HLD (hyperlipidemia)   . DMII (diabetes mellitus, type 2)   . GERD (gastroesophageal reflux disease)   . PUD (peptic ulcer disease)   . Tobacco abuse   . Hx-TIA (transient ischemic attack) 2009    s/p CEA -  Dr. Hart Rochester  . Chronic low back pain   . Psychosexual dysfunction with inhibited sexual excitement   . Occlusion and stenosis of carotid artery without mention of cerebral infarction   . Osteoarthrosis, unspecified whether generalized or localized, other specified sites   . Other diseases of lung, not elsewhere classified     solitary pulmonary nodule (enlarged 2011)-follow up - stable on 2012 CT Delford Field)  . Embolism and thrombosis of unspecified site   . Diverticulosis    BP 169/91  Pulse 70  Resp 17  Ht 5\' 6"  (1.676 m)  Wt 153 lb (69.4 kg)  BMI 24.69 kg/m2  SpO2 98%   Review of Systems  Musculoskeletal: Positive for back pain and gait problem.  All other systems reviewed and are negative.       Objective:   Physical  Exam Constitutional: He is oriented to person, place, and time. He appears well-developed and well-nourished.  HENT:  Head: Normocephalic.  Neck: Neck supple.  Musculoskeletal: He exhibits tenderness.  Neurological: He is alert and oriented to person, place, and time.  Skin: Skin is warm and dry.  Psychiatric: He has a normal mood and affect.  Symmetric normal motor tone is noted throughout. Normal muscle bulk, except atrophy in thigh muscles bilateral. Muscle testing reveals 5/5 muscle strength of the upper extremity, and 5/5 of the lower extremity. Full range of motion in upper and lower extremities, except decrease ROM in right knee, extension deficit of 20 degrees. ROM of spine is restricted. Fine motor movements are normal in both hands.  Sensory is intact and symmetric to light touch, pinprick and proprioception.  DTR in the upper and lower extremity are present and symmetric 2+. No clonus is noted.  Patient arises from chair without difficulty. Wide based gait with a limp and with a cane. Able to stand on heels and toes . Tandem walk is possible with help, but unstable. No pronator drift. Rhomberg negative        Assessment & Plan:  1. Lumbar spondylosisunderwent facet block 03/10/2012 [facility]  2. Lumbar postlaminectomy syndrome  3. Tricompartmental right knee osteoarthritis./Right knee pain.Patient has seen Dr. Enid Baas PA, who recommended a TKR .  He continues to use a cane for ambulation.  I have suggested physical therapy to work on quadriceps strengthening and l hamstrings, he declines. I showed him exercises, that he can do at home to strengthen his thigh muscles.  Also suggested pool therapy program, but patient does not have access to a pool.  The patient's last UDS was negative for opioids, he states, that he ran out off his medication 2 days early, also the test was evaluated one day after he gave his sample, at the last visit. I educated the patient, that he should take  the medication as prescribed, and if this happens again, we would not prescribe narcotics any more. The patient understood and agreed.  Refilled his MS-Contin and Hydrocodone today.  Follow up in one month.

## 2012-11-10 NOTE — Patient Instructions (Signed)
Continue with your walking  

## 2012-11-24 ENCOUNTER — Other Ambulatory Visit: Payer: Self-pay | Admitting: Family Medicine

## 2012-12-08 ENCOUNTER — Ambulatory Visit: Payer: Medicare Other | Admitting: Physical Medicine and Rehabilitation

## 2012-12-11 ENCOUNTER — Encounter
Payer: Medicare Other | Attending: Physical Medicine and Rehabilitation | Admitting: Physical Medicine and Rehabilitation

## 2012-12-11 ENCOUNTER — Encounter: Payer: Self-pay | Admitting: Physical Medicine and Rehabilitation

## 2012-12-11 VITALS — BP 137/80 | HR 101 | Resp 16 | Ht 66.0 in | Wt 144.4 lb

## 2012-12-11 DIAGNOSIS — G8929 Other chronic pain: Secondary | ICD-10-CM | POA: Insufficient documentation

## 2012-12-11 DIAGNOSIS — M545 Low back pain, unspecified: Secondary | ICD-10-CM | POA: Insufficient documentation

## 2012-12-11 DIAGNOSIS — E119 Type 2 diabetes mellitus without complications: Secondary | ICD-10-CM | POA: Insufficient documentation

## 2012-12-11 DIAGNOSIS — I10 Essential (primary) hypertension: Secondary | ICD-10-CM | POA: Insufficient documentation

## 2012-12-11 DIAGNOSIS — M1710 Unilateral primary osteoarthritis, unspecified knee: Secondary | ICD-10-CM | POA: Insufficient documentation

## 2012-12-11 DIAGNOSIS — E785 Hyperlipidemia, unspecified: Secondary | ICD-10-CM | POA: Insufficient documentation

## 2012-12-11 DIAGNOSIS — M47817 Spondylosis without myelopathy or radiculopathy, lumbosacral region: Secondary | ICD-10-CM | POA: Insufficient documentation

## 2012-12-11 DIAGNOSIS — F17200 Nicotine dependence, unspecified, uncomplicated: Secondary | ICD-10-CM | POA: Insufficient documentation

## 2012-12-11 DIAGNOSIS — Z8673 Personal history of transient ischemic attack (TIA), and cerebral infarction without residual deficits: Secondary | ICD-10-CM | POA: Insufficient documentation

## 2012-12-11 DIAGNOSIS — K219 Gastro-esophageal reflux disease without esophagitis: Secondary | ICD-10-CM | POA: Insufficient documentation

## 2012-12-11 DIAGNOSIS — Z86718 Personal history of other venous thrombosis and embolism: Secondary | ICD-10-CM | POA: Insufficient documentation

## 2012-12-11 DIAGNOSIS — M961 Postlaminectomy syndrome, not elsewhere classified: Secondary | ICD-10-CM

## 2012-12-11 DIAGNOSIS — R209 Unspecified disturbances of skin sensation: Secondary | ICD-10-CM | POA: Insufficient documentation

## 2012-12-11 MED ORDER — MORPHINE SULFATE ER 30 MG PO TBCR: 30.0000 mg | EXTENDED_RELEASE_TABLET | Freq: Once | ORAL | Status: DC

## 2012-12-11 MED ORDER — GABAPENTIN 300 MG PO CAPS: 300.0000 mg | ORAL_CAPSULE | Freq: Four times a day (QID) | ORAL | Status: DC

## 2012-12-11 MED ORDER — HYDROCODONE-ACETAMINOPHEN 7.5-300 MG PO TABS: 1.0000 | ORAL_TABLET | Freq: Four times a day (QID) | ORAL | Status: DC

## 2012-12-11 NOTE — Patient Instructions (Signed)
Continue with staying as active as tolerated 

## 2012-12-11 NOTE — Progress Notes (Signed)
Subjective:    Patient ID: Bobby Mimes Sr., male    DOB: January 05, 1946, 66 y.o.   MRN: [id number]  HPI The patient complains about chronic low back pain which radiates into the left lateral thigh to the knee. The patient also complains about numbness and tingling in the same area.the patient also complains about right knee pain.  The problem has been stable .  He has tricompartmental osteoarthritis of the right knee.  He has had 2 previous lumbar spine operations and a cervical spine  operation. The last lumbar surgery was back in 2005 by Dr. Danielle Dess, L2-3  Diskectomy. The patient has seen Dr. Enid Baas PA, who recommended a right knee replacement, the patient's wife had lumbar surgery, and the patient has not followed up on it yet.   Pain Inventory Average Pain 8 Pain Right Now 8 My pain is constant, sharp and burning  In the last 24 hours, has pain interfered with the following? General activity 7 Relation with others 7 Enjoyment of life 7 What TIME of day is your pain at its worst? all the time Sleep (in general) Poor  Pain is worse with: walking, bending, sitting, inactivity and standing Pain improves with: medication Relief from Meds: 4  Mobility use a cane how many minutes can you walk? 5 ability to climb steps?  no do you drive?  yes Do you have any goals in this area?  yes  Function disabled: date disabled 1995 Do you have any goals in this area?  yes  Neuro/Psych trouble walking  Prior Studies Any changes since last visit?  no  Physicians involved in your care Any changes since last visit?  no   Family History  Problem Relation Age of Onset  . Heart attack Father     CABG 6v  . Arthritis Father   . Alzheimer's disease Mother   . Arthritis Mother   . Bipolar disorder Sister   . Bipolar disorder Sister   . Cancer Neg Hx   . Diabetes Neg Hx   . Stroke Neg Hx    History   Social History  . Marital Status: Married    Spouse Name: N/A    Number of  Children: N/A  . Years of Education: N/A   Occupational History  . Retired/disabled    Social History Main Topics  . Smoking status: Current Some Day Smoker -- 0.3 packs/day    Types: Cigarettes  . Smokeless tobacco: Never Used  . Alcohol Use: No  . Drug Use: No  . Sexually Active: None   Other Topics Concern  . 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 Nose surgery 1982  . Neck surgery 1984  . Knee surgery 1997  . Carotid endarterectomy 5/09    with dacron patch angioplasty with resection of redundant carotid artery with primary reanastomosis  . Esophagogastroduodenoscopy 2007    deformed pylorus; gastric ulcer  . Gastrojejunostomy 4/09    laparoscopic; and high selective anterior and posterior vagotomies  . Back surgery   . Cervical spine surgery   . Colonoscopy 2008    diverticulosis (Dr. Bosie Clos) repeat 2018   Past Medical History  Diagnosis Date  . HTN (hypertension)   . HLD (hyperlipidemia)   . DMII (diabetes mellitus, type 2)   . GERD (gastroesophageal reflux disease)   .  PUD (peptic ulcer disease)   . Tobacco abuse   . Hx-TIA (transient ischemic attack) 2009    s/p CEA - Dr. Hart Rochester  . Chronic low back pain   . Psychosexual dysfunction with inhibited sexual excitement   . Occlusion and stenosis of carotid artery without mention of cerebral infarction   . Osteoarthrosis, unspecified whether generalized or localized, other specified sites   . Other diseases of lung, not elsewhere classified     solitary pulmonary nodule (enlarged 2011)-follow up - stable on 2012 CT Delford Field)  . Embolism and thrombosis of unspecified site   . Diverticulosis    BP 137/80  Pulse 101  Resp 16  Ht 5\' 6"  (1.676 m)  Wt 144 lb 6.4 oz (65.499 kg)  BMI 23.31 kg/m2  SpO2 97%    Review of Systems  Musculoskeletal: Positive  for back pain and gait problem.  All other systems reviewed and are negative.       Objective:   Physical Exam Constitutional: He is oriented to person, place, and time. He appears well-developed and well-nourished.  HENT:  Head: Normocephalic.  Neck: Neck supple.  Musculoskeletal: He exhibits tenderness.  Neurological: He is alert and oriented to person, place, and time.  Skin: Skin is warm and dry.  Psychiatric: He has a normal mood and affect.  Symmetric normal motor tone is noted throughout. Normal muscle bulk, except atrophy in thigh muscles bilateral. Muscle testing reveals 5/5 muscle strength of the upper extremity, and 5/5 of the lower extremity. Full range of motion in upper and lower extremities, except decrease ROM in right knee, extension deficit of 20 degrees. ROM of spine is restricted. Fine motor movements are normal in both hands.  Sensory is intact and symmetric to light touch, pinprick and proprioception.  DTR in the upper and lower extremity are present and symmetric 2+. No clonus is noted.  Patient arises from chair without difficulty. Wide based gait with a limp and with a cane. Able to stand on heels and toes . Tandem walk is possible with help, but unstable. No pronator drift. Rhomberg negative        Assessment & Plan:  1. Lumbar spondylosisunderwent facet block 03/10/2012 [facility]'s  2. Lumbar postlaminectomy syndrome  3. Tricompartmental right knee osteoarthritis./Right knee pain.Patient has seen Dr. Enid Baas PA, who recommended a TKR .  He continues to use a cane for ambulation.  I have suggested physical therapy to work on quadriceps strengthening and l hamstrings, he declines. I showed him exercises, that he can do at home to strengthen his thigh muscles.  Also suggested pool therapy program, but patient does not have access to a pool.  The patient's last UDS was negative for opioids, he states, that he ran out off his medication 2 days early, also the  test was evaluated one day after he gave his sample, at the last visit. I educated the patient, that he should take the medication as prescribed, and if this happens again, we would not prescribe narcotics any more. The patient understood and agreed.  Refilled his MS-Contin and Hydrocodone today.

## 2012-12-21 ENCOUNTER — Other Ambulatory Visit: Payer: Self-pay | Admitting: Family Medicine

## 2013-01-01 ENCOUNTER — Telehealth: Payer: Self-pay | Admitting: Family Medicine

## 2013-01-01 NOTE — Telephone Encounter (Signed)
Patient Information:  Caller Name: Mckinnon  Phone: [phone]  Patient: Bobby Henry, Bobby Henry  Gender: Male  DOB: 1946/05/24  Age: 67 Years  PCP: Eustaquio Boyden [facility])  Office Follow Up:  Does the office need to follow up with this patient?: No  Instructions For The Office: N/A  RN Note:  Patient reports he is unable to come into the office today due to financial reasons. He can come into the office after 1pm tomorrow, 01/02/13.  Symptoms  Reason For Call & Symptoms: Reports he may have an abscessed tooth on the lower right jaw. Right side of face is swollen and painful.  Reviewed Health History In EMR: Yes  Reviewed Medications In EMR: Yes  Reviewed Allergies In EMR: Yes  Reviewed Surgeries / Procedures: Yes  Date of Onset of Symptoms: 12/18/2012  Treatments Tried: Morphine and Vicodin  Treatments Tried Worked: Yes  Guideline(s) Used:  Toothache  Disposition Per Guideline:   Go to Office Now  Reason For Disposition Reached:   Face is swollen  Advice Given:  Reassurance:  Most toothaches are temporary and due to a sensitive tooth. If the pain becomes worse or does not resolve in 24 hours, it could be due to a small cavity.  Floss:  Floss on either side of the painful tooth to remove any wedged food.  Apply Cold to the Area:   Apply an ice pack to the painful jaw for 20 minutes.  Call Your Dentist If:  The toothache becomes worse  Appointment Scheduled:  01/02/2013 14:15:00 Appointment Scheduled Provider:  Eustaquio Boyden [facility])

## 2013-01-01 NOTE — Telephone Encounter (Signed)
Will see then. 

## 2013-01-02 ENCOUNTER — Encounter: Payer: Self-pay | Admitting: Family Medicine

## 2013-01-02 ENCOUNTER — Ambulatory Visit (INDEPENDENT_AMBULATORY_CARE_PROVIDER_SITE_OTHER): Payer: Medicare Other | Admitting: Family Medicine

## 2013-01-02 VITALS — BP 110/70 | HR 64 | Temp 98.2°F | Wt 142.0 lb

## 2013-01-02 DIAGNOSIS — K0520 Aggressive periodontitis, unspecified: Secondary | ICD-10-CM

## 2013-01-02 DIAGNOSIS — Z23 Encounter for immunization: Secondary | ICD-10-CM

## 2013-01-02 MED ORDER — AMOXICILLIN 875 MG PO TABS: 875.0000 mg | ORAL_TABLET | Freq: Two times a day (BID) | ORAL | Status: DC

## 2013-01-02 NOTE — Assessment & Plan Note (Signed)
Poor dentition with periodontitis. rec schedule appt with dentist as will need dental work Treat with continue home pain meds as well as low dose advil (caution with GI hx), and amox course. Flu shot today

## 2013-01-02 NOTE — Progress Notes (Signed)
  Subjective:    Patient ID: Bobby Mimes Sr., male    DOB: Jan 27, 1946, 67 y.o.   MRN: [id number]  HPI CC: R tooth ache  1d h/o R facial swelling with R tooth pain.  Swelling better today.  Has tried hydrocodone for this.  Avoids NSAIDs 2/2 GI issues. On hydrocodone for chronic pain.  No fevers/chills, able to fully open and close mouth.  Requests flu shot today.  Lab Results  Component Value Date   HGBA1C 7.1* 10/08/2011    Past Medical History  Diagnosis Date  . HTN (hypertension)   . HLD (hyperlipidemia)   . DMII (diabetes mellitus, type 2)   . GERD (gastroesophageal reflux disease)   . PUD (peptic ulcer disease)   . Tobacco abuse   . Hx-TIA (transient ischemic attack) 2009    s/p CEA - Dr. Hart Rochester  . Chronic low back pain   . Psychosexual dysfunction with inhibited sexual excitement   . Occlusion and stenosis of carotid artery without mention of cerebral infarction   . Osteoarthrosis, unspecified whether generalized or localized, other specified sites   . Other diseases of lung, not elsewhere classified     solitary pulmonary nodule (enlarged 2011)-follow up - stable on 2012 CT Delford Field)  . Embolism and thrombosis of unspecified site   . Diverticulosis      Review of Systems Per HPI    Objective:   Physical Exam  Nursing note and vitals reviewed. Constitutional: He appears well-developed and well-nourished. No distress.  HENT:       No TMJ tenderness Upper dentures Only front 6 teeth remain on bottom. ++ evidence of periodontitis throughout lower teeth, gingivitis, receded gumline. No evidence of abscess.       Assessment & Plan:

## 2013-01-02 NOTE — Patient Instructions (Addendum)
Flu shot today. Try advil for pain along with hydrocodone - but take with food, watch for stomach upset on this. Take antibiotic as prescribed today. Schedule appointment as soon as you can with dentist. Let me know if fever >101, or worsening pain despite above.

## 2013-01-08 ENCOUNTER — Encounter: Payer: Medicare Other | Admitting: Physical Medicine and Rehabilitation

## 2013-01-12 ENCOUNTER — Encounter
Payer: Medicare Other | Attending: Physical Medicine and Rehabilitation | Admitting: Physical Medicine and Rehabilitation

## 2013-01-12 ENCOUNTER — Encounter: Payer: Self-pay | Admitting: Physical Medicine and Rehabilitation

## 2013-01-12 VITALS — BP 153/87 | HR 69 | Resp 14 | Ht 67.0 in | Wt 142.0 lb

## 2013-01-12 DIAGNOSIS — M1710 Unilateral primary osteoarthritis, unspecified knee: Secondary | ICD-10-CM | POA: Insufficient documentation

## 2013-01-12 DIAGNOSIS — M961 Postlaminectomy syndrome, not elsewhere classified: Secondary | ICD-10-CM

## 2013-01-12 DIAGNOSIS — M25561 Pain in right knee: Secondary | ICD-10-CM

## 2013-01-12 DIAGNOSIS — M25569 Pain in unspecified knee: Secondary | ICD-10-CM

## 2013-01-12 DIAGNOSIS — Z5181 Encounter for therapeutic drug level monitoring: Secondary | ICD-10-CM

## 2013-01-12 DIAGNOSIS — M47817 Spondylosis without myelopathy or radiculopathy, lumbosacral region: Secondary | ICD-10-CM | POA: Insufficient documentation

## 2013-01-12 MED ORDER — HYDROCODONE-ACETAMINOPHEN 7.5-300 MG PO TABS: 1.0000 | ORAL_TABLET | Freq: Four times a day (QID) | ORAL | Status: DC

## 2013-01-12 MED ORDER — MORPHINE SULFATE ER 30 MG PO TBCR: 30.0000 mg | EXTENDED_RELEASE_TABLET | Freq: Every day | ORAL | Status: DC

## 2013-01-12 NOTE — Patient Instructions (Signed)
Try to stay as active as pain permits. 

## 2013-01-12 NOTE — Progress Notes (Signed)
Subjective:    Patient ID: Bobby Mimes Sr., male    DOB: 1946/05/07, 67 y.o.   MRN: [id number]  HPI The patient complains about chronic low back pain which radiates into the left lateral thigh to the knee. The patient also complains about numbness and tingling in the same area.the patient also complains about right knee pain.  The problem has been stable .  He has tricompartmental osteoarthritis of the right knee.  He has had 2 previous lumbar spine operations and a cervical spine  operation. The last lumbar surgery was back in 2005 by Dr. Danielle Dess, L2-3  Diskectomy. The patient has seen Dr. Enid Baas PA, who recommended a right knee replacement, the patient's wife had lumbar surgery, and the patient has not followed up on it yet.    Pain Inventory Average Pain 7 Pain Right Now 7 My pain is sharp, burning and aching  In the last 24 hours, has pain interfered with the following? General activity 7 Relation with others 7 Enjoyment of life 7 What TIME of day is your pain at its worst? morning and night Sleep (in general) Poor  Pain is worse with: walking, bending, sitting, inactivity and standing Pain improves with: medication Relief from Meds: 5  Mobility use a cane how many minutes can you walk? 5 ability to climb steps?  no do you drive?  yes  Function disabled: date disabled 23 retired  Neuro/Psych trouble walking  Prior Studies Any changes since last visit?  no  Physicians involved in your care Any changes since last visit?  no   Family History  Problem Relation Age of Onset  . Heart attack Father     CABG 6v  . Arthritis Father   . Alzheimer's disease Mother   . Arthritis Mother   . Bipolar disorder Sister   . Bipolar disorder Sister   . Cancer Neg Hx   . Diabetes Neg Hx   . Stroke Neg Hx    History   Social History  . Marital Status: Married    Spouse Name: N/A    Number of Children: N/A  . Years of Education: N/A   Occupational History    . Retired/disabled    Social History Main Topics  . Smoking status: Current Some Day Smoker -- 0.3 packs/day    Types: Cigarettes  . Smokeless tobacco: Never Used  . Alcohol Use: No  . Drug Use: No  . Sexually Active: None   Other Topics Concern  . 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 Nose surgery 1982  . Neck surgery 1984  . Knee surgery 1997  . Carotid endarterectomy 5/09    with dacron patch angioplasty with resection of redundant carotid artery with primary reanastomosis  . Esophagogastroduodenoscopy 2007    deformed pylorus; gastric ulcer  . Gastrojejunostomy 4/09    laparoscopic; and high selective anterior and posterior vagotomies  . Back surgery   . Cervical spine surgery   . Colonoscopy 2008    diverticulosis (Dr. Bosie Clos) repeat 2018   Past Medical History  Diagnosis Date  . HTN (hypertension)   . HLD (hyperlipidemia)   . DMII (diabetes mellitus, type 2)   . GERD (gastroesophageal reflux disease)   . PUD (peptic ulcer disease)   . Tobacco abuse   . Hx-TIA (transient ischemic attack) 2009  s/p CEA - Dr. Hart Rochester  . Chronic low back pain   . Psychosexual dysfunction with inhibited sexual excitement   . Occlusion and stenosis of carotid artery without mention of cerebral infarction   . Osteoarthrosis, unspecified whether generalized or localized, other specified sites   . Other diseases of lung, not elsewhere classified     solitary pulmonary nodule (enlarged 2011)-follow up - stable on 2012 CT Delford Field)  . Embolism and thrombosis of unspecified site   . Diverticulosis    BP 153/87  Pulse 69  Resp 14  Ht 5\' 7"  (1.702 m)  Wt 142 lb (64.411 kg)  BMI 22.24 kg/m2  SpO2 96%   Review of Systems  Musculoskeletal: Positive for back pain and gait problem.  All other systems reviewed and are  negative.       Objective:   Physical Exam Constitutional: He is oriented to person, place, and time. He appears well-developed and well-nourished.  HENT:  Head: Normocephalic.  Neck: Neck supple.  Musculoskeletal: He exhibits tenderness.  Neurological: He is alert and oriented to person, place, and time.  Skin: Skin is warm and dry.  Psychiatric: He has a normal mood and affect.  Symmetric normal motor tone is noted throughout. Normal muscle bulk, except atrophy in thigh muscles bilateral. Muscle testing reveals 5/5 muscle strength of the upper extremity, and 5/5 of the lower extremity. Full range of motion in upper and lower extremities, except decrease ROM in right knee, extension deficit of 20 degrees. ROM of spine is restricted. Fine motor movements are normal in both hands.  Sensory is intact and symmetric to light touch, pinprick and proprioception.  DTR in the upper and lower extremity are present and symmetric 2+. No clonus is noted.  Patient arises from chair without difficulty. Wide based gait with a limp and with a cane. Able to stand on heels and toes . Tandem walk is possible with help, but unstable. No pronator drift. Rhomberg negative        Assessment & Plan:  1. Lumbar spondylosisunderwent facet block 03/10/2012 Kirsten's  2. Lumbar postlaminectomy syndrome  3. Tricompartmental right knee osteoarthritis./Right knee pain.Patient has seen Dr. Enid Baas PA, who recommended a TKR .  He continues to use a cane for ambulation.  I have suggested physical therapy to work on quadriceps strengthening and l hamstrings, he declines. I showed him exercises, that he can do at home to strengthen his thigh muscles.  Also suggested pool therapy program, but patient does not have access to a pool.  The patient's last UDS was negative for opioids, he states, that he ran out off his medication 2 days early, also the test was evaluated one day after he gave his sample, at the last visit.  I educated the patient, that he should take the medication as prescribed, and if this happens again, we would not prescribe narcotics any more. The patient understood and agreed.  Refilled his MS-Contin and Hydrocodone today.

## 2013-01-24 DIAGNOSIS — S62102A Fracture of unspecified carpal bone, left wrist, initial encounter for closed fracture: Secondary | ICD-10-CM

## 2013-01-24 DIAGNOSIS — M1A9XX1 Chronic gout, unspecified, with tophus (tophi): Secondary | ICD-10-CM

## 2013-01-24 HISTORY — DX: Fracture of unspecified carpal bone, left wrist, initial encounter for closed fracture: S62.102A

## 2013-01-24 HISTORY — DX: Chronic gout, unspecified, with tophus (tophi): M1A.9XX1

## 2013-02-10 ENCOUNTER — Encounter: Payer: Self-pay | Admitting: Physical Medicine and Rehabilitation

## 2013-02-10 ENCOUNTER — Encounter
Payer: Medicare Other | Attending: Physical Medicine and Rehabilitation | Admitting: Physical Medicine and Rehabilitation

## 2013-02-10 VITALS — BP 180/98 | HR 68 | Resp 17 | Ht 67.0 in | Wt 146.0 lb

## 2013-02-10 DIAGNOSIS — M1710 Unilateral primary osteoarthritis, unspecified knee: Secondary | ICD-10-CM | POA: Insufficient documentation

## 2013-02-10 DIAGNOSIS — M961 Postlaminectomy syndrome, not elsewhere classified: Secondary | ICD-10-CM | POA: Insufficient documentation

## 2013-02-10 DIAGNOSIS — M47817 Spondylosis without myelopathy or radiculopathy, lumbosacral region: Secondary | ICD-10-CM | POA: Insufficient documentation

## 2013-02-10 DIAGNOSIS — G8929 Other chronic pain: Secondary | ICD-10-CM | POA: Insufficient documentation

## 2013-02-10 MED ORDER — HYDROCODONE-ACETAMINOPHEN 7.5-300 MG PO TABS: 1.0000 | ORAL_TABLET | Freq: Four times a day (QID) | ORAL | Status: DC

## 2013-02-10 MED ORDER — MORPHINE SULFATE ER 30 MG PO TBCR: 30.0000 mg | EXTENDED_RELEASE_TABLET | Freq: Every day | ORAL | Status: DC

## 2013-02-10 NOTE — Patient Instructions (Signed)
Continue with staying as active as pain permits 

## 2013-02-10 NOTE — Progress Notes (Signed)
Subjective:    Patient ID: Bobby Mimes Sr., male    DOB: 06-May-1946, 67 y.o.   MRN: [id number]  HPI The patient complains about chronic low back pain which radiates into the left lateral thigh to the knee. The patient also complains about numbness and tingling in the same area.the patient also complains about right knee pain.  The problem has been stable . The patient states, that he fell on the ice , and stretched his adductors a little, he also hurt his left wrist. He states, that he will see his physician today, who will take a look at those. I advised him to use ice or/and his aspar-cream for his muscle strain, and that he should get his left wrist x-rayed. The patient states, that he will talk about this to his physician also. He has tricompartmental osteoarthritis of the right knee.  He has had 2 previous lumbar spine operations and a cervical spine  operation. The last lumbar surgery was back in 2005 by Dr. Danielle Dess, L2-3  Diskectomy. The patient has seen Dr. Enid Baas PA, who recommended a right knee replacement, the patient's wife had lumbar surgery, and the patient has not followed up on it yet.  Pain Inventory Average Pain 9 Pain Right Now 9 My pain is sharp, burning and stabbing  In the last 24 hours, has pain interfered with the following? General activity 8 Relation with others 8 Enjoyment of life 8 What TIME of day is your pain at its worst? morning, day, evening and night Sleep (in general) Poor  Pain is worse with: walking, bending, sitting, inactivity and standing Pain improves with: medication Relief from Meds: 3  Mobility use a cane how many minutes can you walk? 2 ability to climb steps?  no do you drive?  yes Do you have any goals in this area?  yes  Function not employed: date last employed 95 Do you have any goals in this area?  no  Neuro/Psych trouble walking  Prior Studies Any changes since last visit?  yes bone scan x-rays CT/MRI nerve  study  Physicians involved in your care Any changes since last visit?  yes Primary care unknown Neurosurgeon unknown Orthopedist unknown   Family History  Problem Relation Age of Onset  . Heart attack Father     CABG 6v  . Arthritis Father   . Alzheimer's disease Mother   . Arthritis Mother   . Bipolar disorder Sister   . Bipolar disorder Sister   . Cancer Neg Hx   . Diabetes Neg Hx   . Stroke Neg Hx    History   Social History  . Marital Status: Married    Spouse Name: N/A    Number of Children: N/A  . Years of Education: N/A   Occupational History  . Retired/disabled    Social History Main Topics  . Smoking status: Current Some Day Smoker -- 0.30 packs/day    Types: Cigarettes  . Smokeless tobacco: Never Used  . Alcohol Use: No  . Drug Use: No  . Sexually Active: None   Other Topics Concern  . 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 Nose surgery  1982  . Neck surgery  1984  . Knee surgery  1997  . Carotid endarterectomy  5/09  with dacron patch angioplasty with resection of redundant carotid artery with primary reanastomosis  . Esophagogastroduodenoscopy  2007    deformed pylorus; gastric ulcer  . Gastrojejunostomy  4/09    laparoscopic; and high selective anterior and posterior vagotomies  . Back surgery    . Cervical spine surgery    . Colonoscopy  2008    diverticulosis (Dr. Bosie Clos) repeat 2018   Past Medical History  Diagnosis Date  . HTN (hypertension)   . HLD (hyperlipidemia)   . DMII (diabetes mellitus, type 2)   . GERD (gastroesophageal reflux disease)   . PUD (peptic ulcer disease)   . Tobacco abuse   . Hx-TIA (transient ischemic attack) 2009    s/p CEA - Dr. Hart Rochester  . Chronic low back pain   . Psychosexual dysfunction with inhibited sexual excitement   .  Occlusion and stenosis of carotid artery without mention of cerebral infarction   . Osteoarthrosis, unspecified whether generalized or localized, other specified sites   . Other diseases of lung, not elsewhere classified     solitary pulmonary nodule (enlarged 2011)-follow up - stable on 2012 CT Delf[facility] Field)  . Embolism and thrombosis of unspecified site   . Diverticulosis    BP 180/98  Pulse 68  Resp 17  Ht 5\' 7"  (1.702 m)  Wt 146 lb (66.225 kg)  BMI 22.86 kg/m2  SpO2 100%     Review of Systems  Musculoskeletal: Positive for gait problem.  All other systems reviewed and are negative.       Objective:   Physical Exam Constitutional: He is oriented to person, place, and time. He appears well-developed and well-nourished.  HENT:  Head: Normocephalic.  Neck: Neck supple.  Musculoskeletal: He exhibits tenderness.  Neurological: He is alert and oriented to person, place, and time.  Skin: Skin is warm and dry.  Psychiatric: He has a normal mood and affect.  Symmetric normal motor tone is noted throughout. Normal muscle bulk, except atrophy in thigh muscles bilateral. Muscle testing reveals 5/5 muscle strength of the upper extremity, and 5/5 of the lower extremity. Full range of motion in upper and lower extremities, except decrease ROM in right knee, extension deficit of 20 degrees. ROM of spine is restricted. Fine motor movements are normal in both hands.  Sensory is intact and symmetric to light touch, pinprick and proprioception.  DTR in the upper and lower extremity are present and symmetric 2+. No clonus is noted.  Patient arises from chair without difficulty. Wide based gait with a limp and with a cane. Able to stand on heels and toes . Tandem walk is possible with help, but unstable. No pronator drift. Rhomberg negative        Assessment & Plan:  1. Lumbar spondylosisunderwent facet block 03/10/2012 Kirsten's  2. Lumbar postlaminectomy syndrome  3. Tricompartmental right  knee osteoarthritis./Right knee pain.Patient has seen Dr. Enid Baas PA, who recommended a TKR .  He continues to use a cane for ambulation.  I have suggested physical therapy to work on quadriceps strengthening and l hamstrings, he declines. I showed him exercises, that he can do at home to strengthen his thigh muscles.  Also suggested pool therapy program, but patient does not have access to a pool.  The patient's last UDS was negative for opioids, he states, that he ran out off his medication 2 days early, also the test was evaluated one day after he gave his sample, at the last visit. I educated the patient, that he should take  the medication as prescribed, and if this happens again, we would not prescribe narcotics any more. The patient understood and agreed.  Refilled his MS-Contin and Hydrocodone today.

## 2013-02-11 ENCOUNTER — Telehealth: Payer: Self-pay | Admitting: Family Medicine

## 2013-02-11 NOTE — Telephone Encounter (Signed)
Patient Information:  Caller Name: Denman  Phone: [phone]  Patient: Bobby Henry, Bobby Henry  Gender: Male  DOB: [birth year]  Age: 67 Years  PCP: Eust[facility])  Office Follow Up:  Does the office need to follow up with this patient?: Yes  Instructions For The Office: See within 4 hours' disposition; info to office for staff/provider management of appt need/workin krs/can  RN Note:  States has tennis-ball sized lump to right groin.  States he pulled something when he fell on the ice last week.  States knot is tender to touch and it hurts when he sits or stands up.  States warm to touch.  Afebrile.  Per skin lesion protocol, disposition See Within 4 Hours; no appts available in Epic.  Info to office for staff/provider review/appt management.  May reach patient at [phone].  krs/can  Symptoms  Reason For Call & Symptoms: groin knot; located on right side.  Slipped on the ice a week ago.  KNot came up 02/10/13 PM.  Reviewed Health History In EMR: Yes  Reviewed Medications In EMR: Yes  Reviewed Allergies In EMR: Yes  Reviewed Surgeries / Procedures: Yes  Date of Onset of Symptoms: 02/10/2013  Guideline(s) Used:  Skin Lesion - Moles or Growths  Disposition Per Guideline:   See Today in Office  Reason For Disposition Reached:   Looks infected (e.g., spreading redness, pus, red streak)  Advice Given:  N/A

## 2013-02-11 NOTE — Telephone Encounter (Signed)
Spoke with Rena about this.  °

## 2013-02-11 NOTE — Telephone Encounter (Signed)
Spoke with Dr Sharen Hones and contacted pt; pt scheduled appt with Dr Reece Agar 02/12/13 at 8:15 with Dr Reece Agar. If pt's condition changes or worsens prior to appt pt to go to UC. Pt voiced understanding and was OK with appt and instructions.

## 2013-02-12 ENCOUNTER — Ambulatory Visit (INDEPENDENT_AMBULATORY_CARE_PROVIDER_SITE_OTHER)
Admission: RE | Admit: 2013-02-12 | Discharge: 2013-02-12 | Disposition: A | Discharge status: Home / Self Care | Payer: Medicare Other | Source: Ambulatory Visit | Attending: Family Medicine | Admitting: Family Medicine

## 2013-02-12 ENCOUNTER — Ambulatory Visit (INDEPENDENT_AMBULATORY_CARE_PROVIDER_SITE_OTHER): Payer: Medicare Other | Admitting: Family Medicine

## 2013-02-12 ENCOUNTER — Encounter: Payer: Self-pay | Admitting: Family Medicine

## 2013-02-12 VITALS — BP 142/80 | HR 68 | Temp 97.7°F | Wt 145.8 lb

## 2013-02-12 DIAGNOSIS — M25532 Pain in left wrist: Secondary | ICD-10-CM

## 2013-02-12 DIAGNOSIS — K4090 Unilateral inguinal hernia, without obstruction or gangrene, not specified as recurrent: Secondary | ICD-10-CM

## 2013-02-12 DIAGNOSIS — R0989 Other specified symptoms and signs involving the circulatory and respiratory systems: Secondary | ICD-10-CM

## 2013-02-12 DIAGNOSIS — M25539 Pain in unspecified wrist: Secondary | ICD-10-CM

## 2013-02-12 NOTE — Progress Notes (Signed)
  Subjective:    Patient ID: Bobby Mimes Sr., male    DOB: 11-17-1946, 67 y.o.   MRN: [id number]  HPI CC: knot in groin  DOI: 02/05/2013 Slipped on ice last week, after tripping over dog.  Fell on wrist and split legs.  Then 2d ago noticed knot in groin.  Seems more swollen.  Very painful, worse with sitting and standing.  Improved at night when laying down.  Did try some try tylenol, has been icing area.  Also fell on L wrist.  On pain meds per pain clinic - MS contin and hydrocodone.  No h/o anything like this in past.  Past Medical History  Diagnosis Date  . HTN (hypertension)   . HLD (hyperlipidemia)   . DMII (diabetes mellitus, type 2)   . GERD (gastroesophageal reflux disease)   . PUD (peptic ulcer disease)   . Tobacco abuse   . Hx-TIA (transient ischemic attack) 2009    s/p CEA - Dr. Hart Rochester  . Chronic low back pain   . Psychosexual dysfunction with inhibited sexual excitement   . Occlusion and stenosis of carotid artery without mention of cerebral infarction   . Osteoarthrosis, unspecified whether generalized or localized, other specified sites   . Other diseases of lung, not elsewhere classified     solitary pulmonary nodule (enlarged 2011)-follow up - stable on 2012 CT Delford Field)  . Embolism and thrombosis of unspecified site   . Diverticulosis     Past Surgical History  Procedure Laterality Date  . Nose surgery  1982  . Neck surgery  1984  . Knee surgery  1997  . Carotid endarterectomy  5/09    with dacron patch angioplasty with resection of redundant carotid artery with primary reanastomosis  . Esophagogastroduodenoscopy  2007    deformed pylorus; gastric ulcer  . Gastrojejunostomy  4/09    laparoscopic; and high selective anterior and posterior vagotomies  . Back surgery    . Cervical spine surgery    . Colonoscopy  2008    diverticulosis (Dr. Bosie Clos) repeat 2018    Review of Systems Per HPI    Objective:   Physical Exam  Nursing note and vitals  reviewed. Constitutional: He appears well-developed and well-nourished. No distress.  Abdominal: Soft. Normal appearance and bowel sounds are normal. He exhibits abdominal bruit. He exhibits no distension, no ascites and no mass. There is no hepatosplenomegaly. There is no tenderness. There is no rebound and no CVA tenderness. A hernia is present. Hernia confirmed positive in the right inguinal area.  Musculoskeletal:  L wrist - swelling at lateral wrist, tenderness at anatomical snuff box. 2+ rad pulses Sensation intact      Assessment & Plan:

## 2013-02-12 NOTE — Patient Instructions (Signed)
For bulge in groin - pass by Marion's office for surgery referral. For wrist pain - checked xrays today - if positive for fracture, will refer to orthopedist for evaluation. For abdominal bruit - we will check abdominal ultrasound.

## 2013-02-12 NOTE — Assessment & Plan Note (Signed)
After fall on outstretched hand - with anatomical snuffbox tenderness.  Check xray today to r/o scaphoid fracture - if positive, discussed referral to hand.

## 2013-02-12 NOTE — Assessment & Plan Note (Signed)
New.  check abd duplex.  Discussed importance of smoking cessation.

## 2013-02-12 NOTE — Assessment & Plan Note (Signed)
Refer to surgery 

## 2013-02-13 ENCOUNTER — Encounter: Payer: Self-pay | Admitting: Family Medicine

## 2013-02-15 ENCOUNTER — Other Ambulatory Visit: Payer: Self-pay | Admitting: Family Medicine

## 2013-02-18 ENCOUNTER — Telehealth (INDEPENDENT_AMBULATORY_CARE_PROVIDER_SITE_OTHER): Payer: Self-pay

## 2013-02-18 ENCOUNTER — Encounter (INDEPENDENT_AMBULATORY_CARE_PROVIDER_SITE_OTHER): Payer: Self-pay | Admitting: General Surgery

## 2013-02-18 ENCOUNTER — Ambulatory Visit (INDEPENDENT_AMBULATORY_CARE_PROVIDER_SITE_OTHER): Payer: Medicare Other | Admitting: General Surgery

## 2013-02-18 ENCOUNTER — Encounter (INDEPENDENT_AMBULATORY_CARE_PROVIDER_SITE_OTHER): Payer: Self-pay

## 2013-02-18 VITALS — BP 136/78 | HR 72 | Temp 98.0°F | Resp 18 | Ht 67.0 in | Wt 145.2 lb

## 2013-02-18 DIAGNOSIS — I714 Abdominal aortic aneurysm, without rupture: Secondary | ICD-10-CM

## 2013-02-18 DIAGNOSIS — K4090 Unilateral inguinal hernia, without obstruction or gangrene, not specified as recurrent: Secondary | ICD-10-CM

## 2013-02-18 NOTE — Progress Notes (Signed)
Patient ID: Bobby KNOTH Sr., male   DOB: 1946/08/05, 67 y.o.   MRN: [id number]  Chief Complaint  Patient presents with  . Inguinal Hernia    HPI Bobby TAMBLYN Sr. is a 67 y.o. male.  This patient is referred by Dr. Sharen Hones for evaluation of a symptomatic right inguinal hernia. He first noticed a bulge in his right groin about 2 weeks ago after falling on ice. He says that he notices a bulge about the size of a tennis ball in the right groin which is fluctuating in size. This area has increased in size and it only partially reduces. He says it is okay during the day but at night when he lives down he has increased pain in the area. Otherwise he says his bowels are normal he denies any obstructive symptoms. He does have a recently diagnosed abdominal bruit and possible aortic aneurysm and is scheduled for ultrasound of this tomorrow. He also has a history of peptic ulcer disease which required surgical intervention as well as a history of myocardial infarction as well as stroke. He is status post carotid endarterectomy HPI  Past Medical History  Diagnosis Date  . HTN (hypertension)   . HLD (hyperlipidemia)   . DMII (diabetes mellitus, type 2)   . GERD (gastroesophageal reflux disease)   . PUD (peptic ulcer disease)   . Tobacco abuse   . Hx-TIA (transient ischemic attack) 2009    s/p CEA - Dr. Hart Rochester  . Chronic low back pain   . Psychosexual dysfunction with inhibited sexual excitement   . Occlusion and stenosis of carotid artery without mention of cerebral infarction   . Osteoarthrosis, unspecified whether generalized or localized, other specified sites   . Other diseases of lung, not elsewhere classified     solitary pulmonary nodule (enlarged 2011)-follow up - stable on 2012 CT Delford Field)  . Embolism and thrombosis of unspecified site   . Diverticulosis   . 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 Inguinal hernia     Past Surgical History   Procedure Laterality Date  . Nose surgery  1982  . Neck surgery  1984  . Knee surgery  1997  . Carotid endarterectomy  5/09    with dacron patch angioplasty with resection of redundant carotid artery with primary reanastomosis  . Esophagogastroduodenoscopy  2007    deformed pylorus; gastric ulcer  . Gastrojejunostomy  4/09    laparoscopic; and high selective anterior and posterior vagotomies  . Back surgery    . Cervical spine surgery    . Colonoscopy  2008    diverticulosis (Dr. Bosie Clos) repeat 2018    Family History  Problem Relation Age of Onset  . Heart attack Father     CABG 6v  . Arthritis Father   . Alzheimer's disease Mother   . Arthritis Mother   . Bipolar disorder Sister   . Bipolar disorder Sister   . Cancer Neg Hx   . Diabetes Neg Hx   . Stroke Neg Hx     Social History History  Substance Use Topics  . Smoking status: Current Some Day Smoker -- 0.30 packs/day    Types: Cigarettes  . Smokeless tobacco: Never Used  . Alcohol Use: No    Allergies  Allergen Reactions  . Varenicline Tartrate     REACTION: personality change    Current Outpatient Prescriptions  Medication Sig Dispense Refill  . amLODipine (NORVASC) 10 MG tablet  TAKE 1 TABLET BY MOUTH EVERY DAY  30 tablet  3  . aspirin 81 MG tablet Take 81 mg by mouth daily.        . CRESTOR 10 MG tablet TAKE 1 TABLET BY MOUTH EVERY EVENING  30 tablet  10  . enalapril-hydrochlorothiazide (VASERETIC) 10-25 MG per tablet TAKE 2 TABLETS BY MOUTH DAILY  60 tablet  11  . gabapentin (NEURONTIN) 300 MG capsule Take 1 capsule (300 mg total) by mouth 4 (four) times daily.  120 capsule  3  . glipiZIDE (GLUCOTROL) 10 MG tablet TAKE 1 TABLET BY MOUTH TWICE A DAY  60 tablet  11  . Hydrocodone-Acetaminophen 7.5-300 MG TABS Take 1 tablet by mouth 4 (four) times daily.  120 each  0  . metFORMIN (GLUCOPHAGE-XR) 750 MG 24 hr tablet TAKE 1 TABLET BY MOUTH DAILY WITH BREAKFAST.  30 tablet  11  . metoprolol (LOPRESSOR) 50 MG  tablet TAKE 1&1/2 TABLETS BY MOUTH TWICE DAILY FOR BLOOD PRESSURE  90 tablet  3  . morphine (MS CONTIN) 30 MG 12 hr tablet Take 1 tablet (30 mg total) by mouth daily.  30 tablet  0  . niacin (NIASPAN) 750 MG CR tablet Take 750 mg by mouth daily.      [name] omeprazole (PRILOSEC OTC) 20 MG tablet Take 20 mg by mouth 2 (two) times daily.         No current facility-administered medications for this visit.    Review of Systems Review of Systems All other review of systems negative or noncontributory except as stated in the HPI  Blood pressure 136/78, pulse 72, temperature 98 F (36.7 C), temperature source Temporal, resp. rate 18, height 5\' 7"  (1.702 m), weight 145 lb 4 oz (65.885 kg).  Physical Exam Physical Exam Physical Exam  Vitals reviewed. Constitutional: He is oriented to person, place, and time. He appears well-developed and well-nourished. No distress.  HENT:  Head: Normocephalic and atraumatic.  Mouth/Throat: No oropharyngeal exudate.  Eyes: Conjunctivae and EOM are normal. Pupils are equal, round, and reactive to light. Right eye exhibits no discharge. Left eye exhibits no discharge. No scleral icterus.  Neck: Normal range of motion. No tracheal deviation present.  Cardiovascular: Normal rate, regular rhythm and normal heart sounds.   Pulmonary/Chest: Effort normal and breath sounds normal. No stridor. No respiratory distress. He has no wheezes. He has no rales. He exhibits no tenderness.  Abdominal: Soft. Bowel sounds are normal. He exhibits no distension and no mass. There is no tenderness. There is no rebound and no guarding. He does have an abdominal bruit on auscultation but I do not appreciate any pulsatile mass.  He has a moderate sized, reducible RIH and a small LIH Musculoskeletal: Normal range of motion. He exhibits no edema and no tenderness.  Neurological: He is alert and oriented to person, place, and time.  Skin: Skin is warm and dry. No rash noted. He is not  diaphoretic. No erythema. No pallor.  Psychiatric: He has a normal mood and affect. His behavior is normal. Judgment and thought content normal.    Data Reviewed   Assessment    Moderate sized right inguinal hernia-symptomatic Probable small left inguinal hernia-asymptomatic Abdominal bruit, possible aortic aneurysm I agree that he does have a symptomatic right inguinal hernia on exam and he has a probable left inguinal hernia which is small and asymptomatic as well. We discussed the possibilities for repair including open and laparoscopic repair. I would probably recommend open right inguinal  hernia repair with mesh for this patient and follow the left hernia for now. However, he has an abdominal bruit on exam and is scheduled for an abdominal ultrasound tomorrow to evaluate for possible aortic aneurysm. This may take priority depending on the ultrasound results. If this is a sizable aneurysm, no recommend repair this prior to hernia repair. We have also recommended and requested his cardiologist to evaluate him for possible surgery and if the workup of his abdominal aneurysm shows that this will be a nonoperative management, we will go ahead and schedule him for surgery as soon as he receives clearance from his cardiologist    Plan     abdominal ultrasound Cardiology clearance We will schedule surgery after these for possible open right inguinal hernia with mesh        Icelynn Onken DAVID 02/18/2013, 9:28 AM

## 2013-02-18 NOTE — Telephone Encounter (Signed)
Cardiac clearance faxed to Dr. Royann Shivers @ [phone], fax confirmation rec'd attached to clearance

## 2013-02-19 ENCOUNTER — Encounter (INDEPENDENT_AMBULATORY_CARE_PROVIDER_SITE_OTHER): Payer: Medicare Other

## 2013-02-19 DIAGNOSIS — I739 Peripheral vascular disease, unspecified: Secondary | ICD-10-CM

## 2013-02-19 DIAGNOSIS — R0989 Other specified symptoms and signs involving the circulatory and respiratory systems: Secondary | ICD-10-CM

## 2013-02-21 DIAGNOSIS — I7090 Unspecified atherosclerosis: Secondary | ICD-10-CM

## 2013-02-21 DIAGNOSIS — I708 Atherosclerosis of other arteries: Secondary | ICD-10-CM

## 2013-02-21 HISTORY — DX: Unspecified atherosclerosis: I70.90

## 2013-02-21 HISTORY — DX: Atherosclerosis of other arteries: I70.8

## 2013-02-25 ENCOUNTER — Other Ambulatory Visit (INDEPENDENT_AMBULATORY_CARE_PROVIDER_SITE_OTHER): Payer: Self-pay | Admitting: General Surgery

## 2013-02-25 DIAGNOSIS — K4090 Unilateral inguinal hernia, without obstruction or gangrene, not specified as recurrent: Secondary | ICD-10-CM

## 2013-02-27 ENCOUNTER — Encounter: Payer: Self-pay | Admitting: Family Medicine

## 2013-03-03 ENCOUNTER — Encounter (INDEPENDENT_AMBULATORY_CARE_PROVIDER_SITE_OTHER): Payer: Self-pay

## 2013-03-03 ENCOUNTER — Encounter (HOSPITAL_COMMUNITY): Payer: Self-pay | Admitting: Pharmacy Technician

## 2013-03-04 ENCOUNTER — Encounter: Payer: Self-pay | Admitting: Family Medicine

## 2013-03-09 ENCOUNTER — Encounter: Payer: Medicare Other | Admitting: Physical Medicine and Rehabilitation

## 2013-03-11 ENCOUNTER — Encounter
Payer: Medicare Other | Attending: Physical Medicine and Rehabilitation | Admitting: Physical Medicine and Rehabilitation

## 2013-03-11 ENCOUNTER — Encounter (HOSPITAL_COMMUNITY)
Admission: RE | Admit: 2013-03-11 | Discharge: 2013-03-11 | Disposition: A | Discharge status: Home / Self Care | Payer: Medicare Other | Source: Ambulatory Visit | Attending: Anesthesiology | Admitting: Anesthesiology

## 2013-03-11 ENCOUNTER — Ambulatory Visit: Payer: Medicare Other | Admitting: Physical Medicine and Rehabilitation

## 2013-03-11 ENCOUNTER — Encounter (HOSPITAL_COMMUNITY): Payer: Self-pay

## 2013-03-11 ENCOUNTER — Encounter: Payer: Self-pay | Admitting: Physical Medicine and Rehabilitation

## 2013-03-11 ENCOUNTER — Encounter (HOSPITAL_COMMUNITY)
Admission: RE | Admit: 2013-03-11 | Discharge: 2013-03-11 | Disposition: A | Discharge status: Home / Self Care | Payer: Medicare Other | Source: Ambulatory Visit | Attending: General Surgery | Admitting: General Surgery

## 2013-03-11 VITALS — BP 131/62 | HR 63 | Temp 98.7°F | Resp 20 | Ht 67.0 in | Wt 144.0 lb

## 2013-03-11 VITALS — BP 149/79 | HR 61 | Resp 14 | Ht 67.0 in | Wt 145.0 lb

## 2013-03-11 DIAGNOSIS — E119 Type 2 diabetes mellitus without complications: Secondary | ICD-10-CM | POA: Insufficient documentation

## 2013-03-11 DIAGNOSIS — G8929 Other chronic pain: Secondary | ICD-10-CM | POA: Insufficient documentation

## 2013-03-11 DIAGNOSIS — F17200 Nicotine dependence, unspecified, uncomplicated: Secondary | ICD-10-CM | POA: Insufficient documentation

## 2013-03-11 DIAGNOSIS — I10 Essential (primary) hypertension: Secondary | ICD-10-CM | POA: Insufficient documentation

## 2013-03-11 DIAGNOSIS — M545 Low back pain, unspecified: Secondary | ICD-10-CM | POA: Insufficient documentation

## 2013-03-11 DIAGNOSIS — M25569 Pain in unspecified knee: Secondary | ICD-10-CM | POA: Insufficient documentation

## 2013-03-11 DIAGNOSIS — K4090 Unilateral inguinal hernia, without obstruction or gangrene, not specified as recurrent: Secondary | ICD-10-CM | POA: Insufficient documentation

## 2013-03-11 DIAGNOSIS — K219 Gastro-esophageal reflux disease without esophagitis: Secondary | ICD-10-CM | POA: Insufficient documentation

## 2013-03-11 DIAGNOSIS — E785 Hyperlipidemia, unspecified: Secondary | ICD-10-CM | POA: Insufficient documentation

## 2013-03-11 DIAGNOSIS — M47817 Spondylosis without myelopathy or radiculopathy, lumbosacral region: Secondary | ICD-10-CM | POA: Insufficient documentation

## 2013-03-11 DIAGNOSIS — M961 Postlaminectomy syndrome, not elsewhere classified: Secondary | ICD-10-CM | POA: Insufficient documentation

## 2013-03-11 DIAGNOSIS — M1710 Unilateral primary osteoarthritis, unspecified knee: Secondary | ICD-10-CM | POA: Insufficient documentation

## 2013-03-11 LAB — CBC
HCT: 45.5 % (ref 39.0–52.0)
Hemoglobin: 15.6 g/dL (ref 13.0–17.0)
WBC: 12 10*3/uL — ABNORMAL HIGH (ref 4.0–10.5)

## 2013-03-11 LAB — BASIC METABOLIC PANEL
Chloride: 94 mEq/L — ABNORMAL LOW (ref 96–112)
GFR calc Af Amer: >90 mL/min (ref >90)
Potassium: 3.8 mEq/L (ref 3.5–5.1)

## 2013-03-11 LAB — SURGICAL PCR SCREEN
MRSA, PCR: NEGATIVE
Staphylococcus aureus: NEGATIVE

## 2013-03-11 MED ORDER — HYDROCODONE-ACETAMINOPHEN 7.5-300 MG PO TABS: 1.0000 | ORAL_TABLET | Freq: Four times a day (QID) | ORAL | Status: DC

## 2013-03-11 MED ORDER — MORPHINE SULFATE ER 30 MG PO TBCR: 30.0000 mg | EXTENDED_RELEASE_TABLET | Freq: Every day | ORAL | Status: DC

## 2013-03-11 NOTE — Pre-Procedure Instructions (Addendum)
Franchot Mimes Sr.  03/11/2013   Your procedure is scheduled on: 03/17/13  Report to [facility] at 530 AM.  Call this number if you have problems the morning of surgery: [phone]   Remember:   Do not eat food or drink liquids after midnight.   Take these medicines the morning of surgery with A SIP OF WATER: pain med, amlodipine, metoprolol,gabapentin, omeprazole   Do not wear jewelry, make-up or nail polish.  Do not wear lotions, powders, or perfumes. You may wear deodorant.  Do not shave 48 hours prior to surgery. Men may shave face and neck.  Do not bring valuables to the hospital.  Contacts, dentures or bridgework may not be worn into surgery.  Leave suitcase in the car. After surgery it may be brought to your room.  For patients admitted to the hospital, checkout time is 11:00 AM the day of  discharge.   Patients discharged the day of surgery will not be allowed to drive  home.  Name and phone number of your driver:  Special Instructions: Shower using CHG 2 nights before surgery and the night before surgery.  If you shower the day of surgery use CHG.  Use special wash - you have one bottle of CHG for all showers.  You should use approximately 1/3 of the bottle for each shower.   Please read over the following fact sheets that you were given: Pain Booklet, Coughing and Deep Breathing, MRSA Information and Surgical Site Infection Prevention

## 2013-03-11 NOTE — Progress Notes (Signed)
Subjective:    Patient ID: Bobby Mimes Sr., male    DOB: 1946/07/22, 67 y.o.   MRN: [id number]  HPI The patient complains about chronic low back pain which radiates into the left lateral thigh to the knee. The patient also complains about numbness and tingling in the same area.the patient also complains about right knee pain.  The problem has been stable . The patient states, that he fell on the ice , and stretched his adductors a little, he also hurt his left wrist.  X-ray of his left wrist, did not show any acute findings, but he had an old scaphoid fracture. He was also diagnosed with a inguinal hernia, bilateral. He will have surgery on 03/16/2013. He has tricompartmental osteoarthritis of the right knee.  He has had 2 previous lumbar spine operations and a cervical spine  operation. The last lumbar surgery was back in 2005 by Dr. Danielle Dess, L2-3  Diskectomy. The patient has seen Dr. Enid Baas PA, who recommended a right knee replacement, the patient's wife had lumbar surgery, and the patient has not followed up on it yet.  Pain Inventory Average Pain 7 Pain Right Now 7 My pain is sharp  In the last 24 hours, has pain interfered with the following? General activity 7 Relation with others 7 Enjoyment of life 7 What TIME of day is your pain at its worst? all the time Sleep (in general) Poor  Pain is worse with: walking, bending, sitting, inactivity and standing Pain improves with: pacing activities Relief from Meds: 2  Mobility use a cane how many minutes can you walk? 3-4 ability to climb steps?  no do you drive?  yes Do you have any goals in this area?  yes  Function disabled: date disabled 42 Do you have any goals in this area?  yes  Neuro/Psych trouble walking  Prior Studies x-rays CT/MRI Hernia repair next week  Physicians involved in your care Any changes since last visit?  no   Family History  Problem Relation Age of Onset  . Heart attack Father      CABG 6v  . Arthritis Father   . Alzheimer's disease Mother   . Arthritis Mother   . Bipolar disorder Sister   . Bipolar disorder Sister   . Cancer Neg Hx   . Diabetes Neg Hx   . Stroke Neg Hx    History   Social History  . Marital Status: Married    Spouse Name: N/A    Number of Children: N/A  . Years of Education: N/A   Occupational History  . Retired/disabled    Social History Main Topics  . Smoking status: Current Some Day Smoker -- 0.30 packs/day    Types: Cigarettes  . Smokeless tobacco: Never Used  . Alcohol Use: No  . Drug Use: No  . Sexually Active: None   Other Topics Concern  . 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 Nose surgery  1982  . Neck surgery  1984  . Knee surgery  1997  . Carotid endarterectomy  5/09    with dacron patch angioplasty with resection of redundant carotid artery with primary reanastomosis  . Esophagogastroduodenoscopy  2007    deformed pylorus; gastric ulcer  . Gastrojejunostomy  4/09    laparoscopic; and  high selective anterior and posterior vagotomies  . Back surgery    . Cervical spine surgery    . Colonoscopy  2008    diverticulosis (Dr. Bosie Clos) repeat 2018   Past Medical History  Diagnosis Date  . HTN (hypertension)   . HLD (hyperlipidemia)   . DMII (diabetes mellitus, type 2)   . GERD (gastroesophageal reflux disease)   . PUD (peptic ulcer disease)   . Tobacco abuse   . Hx-TIA (transient ischemic attack) 2009  . Chronic low back pain   . Psychosexual dysfunction with inhibited sexual excitement   . Carotid stenosis     s/p CEA - Dr. Hart Rochester  . Osteoarthritis   . Solitary pulmonary nodule     solitary pulmonary nodule (enlarged 2011)-follow up - stable on 2012 CT Delford Field)  . Embolism and thrombosis of unspecified site   . Diverticulosis    . 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 Inguinal hernia   . Atherosclerosis of arteries 02/2013    severe stenosis of SMA, celiac, moderate bilateral common iliac stenosis   BP 149/79  Pulse 61  Resp 14  Ht 5\' 7"  (1.702 m)  Wt 145 lb (65.772 kg)  BMI 22.71 kg/m2  SpO2 95%     Review of Systems  Musculoskeletal: Positive for back pain and gait problem.  All other systems reviewed and are negative.       Objective:   Physical Exam Constitutional: He is oriented to person, place, and time. He appears well-developed and well-nourished.  HENT:  Head: Normocephalic.  Neck: Neck supple.  Musculoskeletal: He exhibits tenderness.  Neurological: He is alert and oriented to person, place, and time.  Skin: Skin is warm and dry.  Psychiatric: He has a normal mood and affect.  Symmetric normal motor tone is noted throughout. Normal muscle bulk, except atrophy in thigh muscles bilateral. Muscle testing reveals 5/5 muscle strength of the upper extremity, and 5/5 of the lower extremity. Full range of motion in upper and lower extremities, except decrease ROM in right knee, extension deficit of 20 degrees. ROM of spine is restricted. Fine motor movements are normal in both hands.  Sensory is intact and symmetric to light touch, pinprick and proprioception.  DTR in the upper and lower extremity are present and symmetric 2+. No clonus is noted.  Patient arises from chair without difficulty. Wide based gait with a limp and with a cane, kyphotic posture because of hernia pain. Able to stand on heels and toes . Tandem walk is possible with help, but unstable. No pronator drift. Rhomberg negative        Assessment & Plan:  1. Lumbar spondylosisunderwent facet block 03/10/2012 Kirsten's  2. Lumbar postlaminectomy syndrome  3. Tricompartmental right knee osteoarthritis./Right knee pain.Patient has seen Dr. Enid Baas PA, who recommended a TKR .  4.  Inguinal hernia, bilateral, surgery scheduled on 03/16/2013 He continues to use a cane for ambulation.    The patient's last UDS was negative for opioids, he states, that he ran out off his medication 2 days early, also the test was evaluated one day after he gave his sample, at the last visit. I educated the patient, that he should take the medication as prescribed, and if this happens again, we would not prescribe narcotics any more. The patient understood and agreed.Patient was out of his medication early again, I educated him again, that he should take his medication as prescribed. He stated, that  he has severe pain because of his hernias, I told him that he should have called [place] and report. I told him that this is the last time that we are accepting this, if this happens one more time, we will d/c his narcotics.  Refilled his MS-Contin and Hydrocodone today.

## 2013-03-11 NOTE — Patient Instructions (Signed)
Contact us , if your surgeon wants to prescribe any other pain medication to you.

## 2013-03-16 MED ORDER — CEFAZOLIN SODIUM-DEXTROSE 2-3 GM-% IV SOLR
2.0000 g | INTRAVENOUS | Status: AC
  Administered 2013-03-17: 2 g via INTRAVENOUS
  Filled 2013-03-16: qty 50

## 2013-03-17 ENCOUNTER — Encounter (HOSPITAL_COMMUNITY): Payer: Self-pay | Admitting: Anesthesiology

## 2013-03-17 ENCOUNTER — Ambulatory Visit (HOSPITAL_COMMUNITY): Payer: Medicare Other | Admitting: Anesthesiology

## 2013-03-17 ENCOUNTER — Encounter (HOSPITAL_COMMUNITY)
Admission: RE | Disposition: A | Discharge status: Home / Self Care | Payer: Self-pay | Source: Ambulatory Visit | Attending: General Surgery

## 2013-03-17 ENCOUNTER — Encounter (HOSPITAL_COMMUNITY): Payer: Self-pay | Admitting: Surgery

## 2013-03-17 ENCOUNTER — Ambulatory Visit (HOSPITAL_COMMUNITY)
Admission: RE | Admit: 2013-03-17 | Discharge: 2013-03-17 | Disposition: A | Discharge status: Home / Self Care | Payer: Medicare Other | Source: Ambulatory Visit | Attending: General Surgery | Admitting: General Surgery

## 2013-03-17 DIAGNOSIS — I69928 Other speech and language deficits following unspecified cerebrovascular disease: Secondary | ICD-10-CM | POA: Insufficient documentation

## 2013-03-17 DIAGNOSIS — K4090 Unilateral inguinal hernia, without obstruction or gangrene, not specified as recurrent: Secondary | ICD-10-CM | POA: Insufficient documentation

## 2013-03-17 DIAGNOSIS — Z79899 Other long term (current) drug therapy: Secondary | ICD-10-CM | POA: Insufficient documentation

## 2013-03-17 DIAGNOSIS — Z7982 Long term (current) use of aspirin: Secondary | ICD-10-CM | POA: Insufficient documentation

## 2013-03-17 DIAGNOSIS — I10 Essential (primary) hypertension: Secondary | ICD-10-CM | POA: Insufficient documentation

## 2013-03-17 DIAGNOSIS — I2510 Atherosclerotic heart disease of native coronary artery without angina pectoris: Secondary | ICD-10-CM | POA: Insufficient documentation

## 2013-03-17 DIAGNOSIS — E785 Hyperlipidemia, unspecified: Secondary | ICD-10-CM | POA: Insufficient documentation

## 2013-03-17 DIAGNOSIS — E119 Type 2 diabetes mellitus without complications: Secondary | ICD-10-CM | POA: Insufficient documentation

## 2013-03-17 DIAGNOSIS — F17200 Nicotine dependence, unspecified, uncomplicated: Secondary | ICD-10-CM | POA: Insufficient documentation

## 2013-03-17 DIAGNOSIS — I252 Old myocardial infarction: Secondary | ICD-10-CM | POA: Insufficient documentation

## 2013-03-17 DIAGNOSIS — Z86718 Personal history of other venous thrombosis and embolism: Secondary | ICD-10-CM | POA: Insufficient documentation

## 2013-03-17 DIAGNOSIS — K219 Gastro-esophageal reflux disease without esophagitis: Secondary | ICD-10-CM | POA: Insufficient documentation

## 2013-03-17 DIAGNOSIS — M1A00X1 Idiopathic chronic gout, unspecified site, with tophus (tophi): Secondary | ICD-10-CM | POA: Insufficient documentation

## 2013-03-17 DIAGNOSIS — I6529 Occlusion and stenosis of unspecified carotid artery: Secondary | ICD-10-CM | POA: Insufficient documentation

## 2013-03-17 DIAGNOSIS — J988 Other specified respiratory disorders: Secondary | ICD-10-CM | POA: Insufficient documentation

## 2013-03-17 HISTORY — PX: INSERTION OF MESH: SHX5868

## 2013-03-17 HISTORY — PX: INGUINAL HERNIA REPAIR: SHX194

## 2013-03-17 LAB — GLUCOSE, CAPILLARY: Glucose-Capillary: 156 mg/dL — ABNORMAL HIGH (ref 70–99)

## 2013-03-17 SURGERY — REPAIR, HERNIA, INGUINAL, ADULT
Anesthesia: General | Site: Groin | Laterality: Right | Wound class: Clean

## 2013-03-17 MED ORDER — BUPIVACAINE HCL (PF) 0.25 % IJ SOLN
INTRAMUSCULAR | Status: AC
  Filled 2013-03-17: qty 30

## 2013-03-17 MED ORDER — PROMETHAZINE HCL 25 MG/ML IJ SOLN: 6.2500 mg | INTRAMUSCULAR | Status: DC | PRN

## 2013-03-17 MED ORDER — LACTATED RINGERS IV SOLN
INTRAVENOUS | Status: DC | PRN
  Administered 2013-03-17 (×2): via INTRAVENOUS

## 2013-03-17 MED ORDER — HYDROMORPHONE HCL PF 1 MG/ML IJ SOLN: 0.2500 mg | INTRAMUSCULAR | Status: DC | PRN

## 2013-03-17 MED ORDER — FENTANYL CITRATE 0.05 MG/ML IJ SOLN
INTRAMUSCULAR | Status: DC | PRN
  Administered 2013-03-17: 150 ug via INTRAVENOUS
  Administered 2013-03-17: 50 ug via INTRAVENOUS

## 2013-03-17 MED ORDER — LIDOCAINE HCL (CARDIAC) 20 MG/ML IV SOLN
INTRAVENOUS | Status: DC | PRN
  Administered 2013-03-17: 40 mg via INTRAVENOUS

## 2013-03-17 MED ORDER — GLYCOPYRROLATE 0.2 MG/ML IJ SOLN
INTRAMUSCULAR | Status: DC | PRN
  Administered 2013-03-17: 0.4 mg via INTRAVENOUS

## 2013-03-17 MED ORDER — OXYCODONE HCL 5 MG/5ML PO SOLN: 5.0000 mg | Freq: Once | ORAL | Status: AC | PRN

## 2013-03-17 MED ORDER — NEOSTIGMINE METHYLSULFATE 1 MG/ML IJ SOLN
INTRAMUSCULAR | Status: DC | PRN
  Administered 2013-03-17: 3 mg via INTRAVENOUS

## 2013-03-17 MED ORDER — MIDAZOLAM HCL 2 MG/2ML IJ SOLN: 0.5000 mg | Freq: Once | INTRAMUSCULAR | Status: DC | PRN

## 2013-03-17 MED ORDER — OXYCODONE HCL 5 MG PO TABS: 5.0000 mg | ORAL_TABLET | ORAL | Status: DC | PRN

## 2013-03-17 MED ORDER — PROPOFOL 10 MG/ML IV BOLUS
INTRAVENOUS | Status: DC | PRN
  Administered 2013-03-17: 200 mg via INTRAVENOUS

## 2013-03-17 MED ORDER — LIDOCAINE-EPINEPHRINE (PF) 1 %-1:200000 IJ SOLN
INTRAMUSCULAR | Status: DC | PRN
  Administered 2013-03-17: 08:00:00 via INTRAMUSCULAR

## 2013-03-17 MED ORDER — LIDOCAINE-EPINEPHRINE 1 %-1:100000 IJ SOLN
INTRAMUSCULAR | Status: AC
  Filled 2013-03-17: qty 1

## 2013-03-17 MED ORDER — 0.9 % SODIUM CHLORIDE (POUR BTL) OPTIME
TOPICAL | Status: DC | PRN
  Administered 2013-03-17: 1000 mL

## 2013-03-17 MED ORDER — OXYCODONE HCL 5 MG PO TABS
ORAL_TABLET | ORAL | Status: AC
  Filled 2013-03-17: qty 1

## 2013-03-17 MED ORDER — EPHEDRINE SULFATE 50 MG/ML IJ SOLN
INTRAMUSCULAR | Status: DC | PRN
  Administered 2013-03-17 (×2): 5 mg via INTRAVENOUS
  Administered 2013-03-17 (×4): 10 mg via INTRAVENOUS

## 2013-03-17 MED ORDER — OXYCODONE HCL 5 MG PO TABS
5.0000 mg | ORAL_TABLET | Freq: Once | ORAL | Status: AC | PRN
  Administered 2013-03-17: 5 mg via ORAL

## 2013-03-17 MED ORDER — ROCURONIUM BROMIDE 100 MG/10ML IV SOLN
INTRAVENOUS | Status: DC | PRN
  Administered 2013-03-17: 40 mg via INTRAVENOUS

## 2013-03-17 MED ORDER — MEPERIDINE HCL 25 MG/ML IJ SOLN: 6.2500 mg | INTRAMUSCULAR | Status: DC | PRN

## 2013-03-17 MED ORDER — MIDAZOLAM HCL 5 MG/5ML IJ SOLN
INTRAMUSCULAR | Status: DC | PRN
  Administered 2013-03-17: 2 mg via INTRAVENOUS

## 2013-03-17 MED ORDER — ONDANSETRON HCL 4 MG/2ML IJ SOLN
INTRAMUSCULAR | Status: DC | PRN
  Administered 2013-03-17: 4 mg via INTRAVENOUS

## 2013-03-17 SURGICAL SUPPLY — 47 items
BLADE SURG 10 STRL SS (BLADE) ×2 IMPLANT
BLADE SURG 15 STRL LF DISP TIS (BLADE) ×1 IMPLANT
BLADE SURG 15 STRL SS (BLADE) ×1
BLADE SURG ROTATE 9660 (MISCELLANEOUS) ×2 IMPLANT
CANISTER SUCTION 2500CC (MISCELLANEOUS) ×2 IMPLANT
CHLORAPREP W/TINT 26ML (MISCELLANEOUS) ×2 IMPLANT
CLOTH BEACON ORANGE TIMEOUT ST (SAFETY) ×2 IMPLANT
COVER SURGICAL LIGHT HANDLE (MISCELLANEOUS) ×2 IMPLANT
DECANTER SPIKE VIAL GLASS SM (MISCELLANEOUS) ×4 IMPLANT
DERMABOND ADVANCED (GAUZE/BANDAGES/DRESSINGS) ×1
DERMABOND ADVANCED .7 DNX12 (GAUZE/BANDAGES/DRESSINGS) ×1 IMPLANT
DRAIN PENROSE 1/2X12 LTX STRL (WOUND CARE) ×2 IMPLANT
DRAPE LAPAROSCOPIC ABDOMINAL (DRAPES) ×2 IMPLANT
ELECT CAUTERY BLADE 6.4 (BLADE) ×2 IMPLANT
ELECT REM PT RETURN 9FT ADLT (ELECTROSURGICAL) ×2
ELECTRODE REM PT RTRN 9FT ADLT (ELECTROSURGICAL) ×1 IMPLANT
GLOVE BIO SURGEON STRL SZ7 (GLOVE) ×2 IMPLANT
GLOVE BIOGEL PI IND STRL 6.5 (GLOVE) ×2 IMPLANT
GLOVE BIOGEL PI INDICATOR 6.5 (GLOVE) ×2
GLOVE SURG SS PI 7.5 STRL IVOR (GLOVE) ×4 IMPLANT
GOWN PREVENTION PLUS XXLARGE (GOWN DISPOSABLE) ×2 IMPLANT
GOWN STRL NON-REIN LRG LVL3 (GOWN DISPOSABLE) ×2 IMPLANT
GOWN STRL REIN XL XLG (GOWN DISPOSABLE) ×2 IMPLANT
KIT BASIN OR (CUSTOM PROCEDURE TRAY) ×2 IMPLANT
KIT ROOM TURNOVER OR (KITS) ×2 IMPLANT
MESH ULTRAPRO 3X6 7.6X15CM (Mesh General) ×2 IMPLANT
NEEDLE HYPO 25GX1X1/2 BEV (NEEDLE) ×2 IMPLANT
NS IRRIG 1000ML POUR BTL (IV SOLUTION) ×2 IMPLANT
PACK SURGICAL SETUP 50X90 (CUSTOM PROCEDURE TRAY) ×2 IMPLANT
PAD ARMBOARD 7.5X6 YLW CONV (MISCELLANEOUS) ×4 IMPLANT
PENCIL BUTTON HOLSTER BLD 10FT (ELECTRODE) ×2 IMPLANT
SPECIMEN JAR SMALL (MISCELLANEOUS) IMPLANT
SPONGE INTESTINAL PEANUT (DISPOSABLE) ×2 IMPLANT
SPONGE LAP 18X18 X RAY DECT (DISPOSABLE) ×4 IMPLANT
SUT MNCRL AB 4-0 PS2 18 (SUTURE) ×2 IMPLANT
SUT PROLENE 2 0 SH DA (SUTURE) ×8 IMPLANT
SUT VIC AB 2-0 BRD 54 (SUTURE) ×2 IMPLANT
SUT VIC AB 2-0 SH 27 (SUTURE) ×3
SUT VIC AB 2-0 SH 27XBRD (SUTURE) ×3 IMPLANT
SUT VIC AB 3-0 SH 27 (SUTURE) ×1
SUT VIC AB 3-0 SH 27X BRD (SUTURE) ×1 IMPLANT
SYR BULB 3OZ (MISCELLANEOUS) ×2 IMPLANT
SYR CONTROL 10ML LL (SYRINGE) ×2 IMPLANT
TOWEL OR 17X24 6PK STRL BLUE (TOWEL DISPOSABLE) ×2 IMPLANT
TOWEL OR 17X26 10 PK STRL BLUE (TOWEL DISPOSABLE) ×2 IMPLANT
TUBE CONNECTING 12X1/4 (SUCTIONS) ×2 IMPLANT
YANKAUER SUCT BULB TIP NO VENT (SUCTIONS) ×2 IMPLANT

## 2013-03-17 NOTE — Op Note (Signed)
NAME:  Bobby Henry, KOHRS NO.:  [id number]  MEDICAL RECORD NO.:  [id number]  LOCATION:  [facility]                         FACILITY:  [facility]  PHYSICIAN:  [name], MD       DATE OF BIRTH:  Oct 07, 1946  DATE OF PROCEDURE:  03/17/2013 DATE OF DISCHARGE:                              OPERATIVE REPORT   PROCEDURE:  Open repair of right inguinal hernia with mesh.  PREOPERATIVE DIAGNOSIS:  Right inguinal hernia.  POSTOPERATIVE DIAGNOSIS:  Right inguinal hernia.  SURGEON:  [name], M.D.  ASSISTANT:  None.  ANESTHESIA:  General endotracheal tube anesthesia with 30 mL of 1% lidocaine with epinephrine, 0.25% Marcaine in a 50:50 mixture.  FLUIDS:  1300 mL of crystalloid.  ESTIMATED BLOOD LOSS:  Minimal.  DRAINS:  None.  SPECIMENS:  None.  COMPLICATIONS:  None apparent.  FINDINGS:  Very weak inguinal floor, sizable indirect inguinal hernia, hypertrophied ilioinguinal nerve with apparent neuroma.  Ilioinguinal nerve was excised due to the location and possible entrapment in the mesh and repair of the inguinal hernia with 3 inch x 6 inch UltraPro mesh.  INDICATIONS FOR PROCEDURE:  Bobby Henry is a 67 year old male who recently slipped on the ice and noticed a bulge in his right groin, this is symptomatic and would like to have this repaired.  He had a reducible hernia on exam.  OPERATIVE DETAILS:  Bobby Henry was seen and evaluated in the preoperative area, and risks and benefits of procedure were again discussed in lay terms.  Informed consent was obtained.  Surgical site was marked prior to anesthetic administration, and he was taken to the operating room, placed on the table in supine position.  General endotracheal tube anesthesia was obtained and prophylactic antibiotics were given.  His groin was prepped and draped in a standard surgical fashion, and procedure time-out was performed with all operative team members, confirmed proper patient, procedure.  The  oblique incision was made in the right groin, and dissection carried down to subcutaneous tissue using Bovie electrocautery.  The external oblique fascia was identified and sharply incised along the length of its fibers, opening this through the external ring.  The cord structures were identified and dissected circumferentially at the pubic tubercle, but Penrose drain was passed around the cord structures for gentle retraction.  The ilioinguinal nerve was identified in its usual anatomic position.  This was seemed to be hypertrophied and thickened in the central portion, possibly consistent with a neuroma.  This nerve was later excised and divided between 2-0 Vicryl ties due to concern for entrapment in the mesh.  The inguinal floor was very weak, but he did not have any obvious direct hernia as the entire internal ring was bulging.  I skeletonized the spermatic cord and separated the hernia sac from the cord structures.  The sac was opened and did not identify any visceral structures in the hernia sac.  High ligation of sac was performed and the sac was oversewn with 2-0 Vicryl suture and the peritoneum was placed back in the abdomen.  I tightened the ring with interrupted 2-0 Vicryl sutures and then cut a 3 inch x 6 inch UltraPro mesh to fit  the inguinal canal, and the mesh was sutured in place at the pubic tubercle with 2-0 Vicryl suture and this was run along the shelving edge of the inguinal ligament, lateral to the internal ring.  Then, interrupted 2-0 Prolene sutures were placed medially and cephalad and laterally to fix the mesh to the abdominal wall.  Care was taken to avoid neurovascular structures with placement of each suture.  A slit was placed in the mesh laterally and the tails of the mesh were passed around the cord structures to create a new internal ring and the tails were sutured laterally.  After the mesh was sutured in place, the wound was irrigated with sterile  saline solution and noted to be hemostatic.  The external oblique fascia was closed over the inguinal canal with a running 2-0 Vicryl suture.  Then, the wound was injected with total of 30 mL of 1% lidocaine with epinephrine and 0.25% Marcaine in a 50:50 mixture, and then Scarpa fascia was approximated with running 3-0 Vicryl suture and the skin edges were approximated with 4-0 Monocryl subcuticular suture. Skin was washed and dried and Dermabond was applied.  All sponge, needle, and instrument counts were correct at the end of the case.  The patient tolerated the procedure well without apparent complications.          ______________________________ [name], MD     BL/MEDQ  D:  03/17/2013  T:  03/17/2013  Job:  [id number]

## 2013-03-17 NOTE — Anesthesia Postprocedure Evaluation (Signed)
  Anesthesia Post-op Note  Patient: Bobby SKIBINSKI Sr.  Procedure(s) Performed: Procedure(s): HERNIA REPAIR INGUINAL ADULT (Right) INSERTION OF MESH  (Right)  Patient Location: PACU  Anesthesia Type:General  Level of Consciousness: awake, alert , oriented and patient cooperative  Airway and Oxygen Therapy: Patient Spontanous Breathing  Post-op Pain: none  Post-op Assessment: Post-op Vital signs reviewed, Patient's Cardiovascular Status Stable, Respiratory Function Stable, Patent Airway, No signs of Nausea or vomiting, Adequate PO intake and Pain level controlled  Post-op Vital Signs: Reviewed and stable  Complications: No apparent anesthesia complications

## 2013-03-17 NOTE — Anesthesia Preprocedure Evaluation (Addendum)
Anesthesia Evaluation  Patient identified by MRN, date of birth, ID band Patient awake    Reviewed: Allergy & Precautions, H&P , NPO status , Patient's Chart, lab work & pertinent test results, reviewed documented beta blocker date and time   History of Anesthesia Complications Negative for: history of anesthetic complications  Airway Mallampati: II TM Distance: >3 FB Neck ROM: Full    Dental  (+) Edentulous Upper   Pulmonary Current Smoker,  breath sounds clear to auscultation  Pulmonary exam normal       Cardiovascular hypertension, + CAD ('12 cath: occluded RCA, normal LVF, EF 60%), + Past MI and + Peripheral Vascular Disease Rhythm:Regular Rate:Normal     Neuro/Psych TIA (s/p CEA)CVA (speech), Residual Symptoms negative psych ROS   GI/Hepatic Neg liver ROS, GERD-  Medicated and Controlled,  Endo/Other  diabetes (glu 118), Well Controlled, Type 2, Oral Hypoglycemic Agents  Renal/GU negative Renal ROS     Musculoskeletal   Abdominal   Peds  Hematology   Anesthesia Other Findings   Reproductive/Obstetrics                           Anesthesia Physical Anesthesia Plan  ASA: III  Anesthesia Plan: General   Post-op Pain Management:    Induction: Intravenous  Airway Management Planned: Oral ETT  Additional Equipment:   Intra-op Plan:   Post-operative Plan: Extubation in OR  Informed Consent: I have reviewed the patients History and Physical, chart, labs and discussed the procedure including the risks, benefits and alternatives for the proposed anesthesia with the patient or authorized representative who has indicated his/her understanding and acceptance.   Dental advisory given  Plan Discussed with: CRNA and Surgeon  Anesthesia Plan Comments: (Plan routine monitors, GETA)        Anesthesia Quick Evaluation

## 2013-03-17 NOTE — Transfer of Care (Signed)
Immediate Anesthesia Transfer of Care Note  Patient: Bobby RAHMING Sr.  Procedure(s) Performed: Procedure(s): HERNIA REPAIR INGUINAL ADULT (Right) INSERTION OF MESH  (Right)  Patient Location: PACU  Anesthesia Type:General  Level of Consciousness: awake, alert  and oriented  Airway & Oxygen Therapy: Patient Spontanous Breathing and Patient connected to nasal cannula oxygen  Post-op Assessment: Report given to PACU RN and Post -op Vital signs reviewed and stable  Post vital signs: Reviewed and stable  Complications: No apparent anesthesia complications

## 2013-03-17 NOTE — Brief Op Note (Signed)
03/17/2013  9:30 AM  PATIENT:  Bobby Mimes Sr.  67 y.o. male  PRE-OPERATIVE DIAGNOSIS:  Right inguinal hernia  POST-OPERATIVE DIAGNOSIS:  Right inguinal hernia  PROCEDURE:  Procedure(s): HERNIA REPAIR INGUINAL ADULT (Right) INSERTION OF MESH  (Right)  SURGEON:  Surgeon(s) and Role:    * [name], DO - Primary  PHYSICIAN ASSISTANT:   ASSISTANTS: none   ANESTHESIA:   general  EBL:  Total I/O In: 1300 [I.V.:1300] Out: -   BLOOD ADMINISTERED:none  DRAINS: none   LOCAL MEDICATIONS USED:  MARCAINE    and LIDOCAINE   SPECIMEN:  No Specimen  DISPOSITION OF SPECIMEN:  N/A  COUNTS:  YES  TOURNIQUET:  * No tourniquets in log *  DICTATION: .Other Dictation: Dictation Number dictated  PLAN OF CARE: Discharge to home after PACU  PATIENT DISPOSITION:  PACU - hemodynamically stable.   Delay start of Pharmacological VTE agent (>24hrs) due to surgical blood loss or risk of bleeding: no

## 2013-03-17 NOTE — H&P (View-Only) (Signed)
Patient ID: Bobby GERDING Sr., male   DOB: [birth year], 67 y.o.   MRN: [id number]  Chief Complaint  Patient presents with  . Inguinal Hernia    HPI Bobby LAUNER Sr. is a 67 y.o. male.  This patient is referred by Dr. Sharen Hones for evaluation of a symptomatic right inguinal hernia. He first noticed a bulge in his right groin about 2 weeks ago after falling on ice. He says that he notices a bulge about the size of a tennis ball in the right groin which is fluctuating in size. This area has increased in size and it only partially reduces. He says it is okay during the day but at night when he lives down he has increased pain in the area. Otherwise he says his bowels are normal he denies any obstructive symptoms. He does have a recently diagnosed abdominal bruit and possible aortic aneurysm and is scheduled for ultrasound of this tomorrow. He also has a history of peptic ulcer disease which required surgical intervention as well as a history of myocardial infarction as well as stroke. He is status post carotid endarterectomy HPI  Past Medical History  Diagnosis Date  . HTN (hypertension)   . HLD (hyperlipidemia)   . DMII (diabetes mellitus, type 2)   . GERD (gastroesophageal reflux disease)   . PUD (peptic ulcer disease)   . Tobacco abuse   . Hx-TIA (transient ischemic attack) 2009    s/p CEA - Dr. Hart Rochester  . Chronic low back pain   . Psychosexual dysfunction with inhibited sexual excitement   . Occlusion and stenosis of carotid artery without mention of cerebral infarction   . Osteoarthrosis, unspecified whether generalized or localized, other specified sites   . Other diseases of lung, not elsewhere classified     solitary pulmonary nodule (enlarged 2011)-follow up - stable on 2012 CT Delford Field)  . Embolism and thrombosis of unspecified site   . Diverticulosis   . 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 Inguinal hernia     Past Surgical History   Procedure Laterality Date  . Nose surgery  1982  . Neck surgery  1984  . Knee surgery  1997  . Carotid endarterectomy  5/09    with dacron patch angioplasty with resection of redundant carotid artery with primary reanastomosis  . Esophagogastroduodenoscopy  2007    deformed pylorus; gastric ulcer  . Gastrojejunostomy  4/09    laparoscopic; and high selective anterior and posterior vagotomies  . Back surgery    . Cervical spine surgery    . Colonoscopy  2008    diverticulosis (Dr. Bosie Clos) repeat 2018    Family History  Problem Relation Age of Onset  . Heart attack Father     CABG 6v  . Arthritis Father   . Alzheimer's disease Mother   . Arthritis Mother   . Bipolar disorder Sister   . Bipolar disorder Sister   . Cancer Neg Hx   . Diabetes Neg Hx   . Stroke Neg Hx     Social History History  Substance Use Topics  . Smoking status: Current Some Day Smoker -- 0.30 packs/day    Types: Cigarettes  . Smokeless tobacco: Never Used  . Alcohol Use: No    Allergies  Allergen Reactions  . Varenicline Tartrate     REACTION: personality change    Current Outpatient Prescriptions  Medication Sig Dispense Refill  . amLODipine (NORVASC) 10 MG tablet  TAKE 1 TABLET BY MOUTH EVERY DAY  30 tablet  3  . aspirin 81 MG tablet Take 81 mg by mouth daily.        . CRESTOR 10 MG tablet TAKE 1 TABLET BY MOUTH EVERY EVENING  30 tablet  10  . enalapril-hydrochlorothiazide (VASERETIC) 10-25 MG per tablet TAKE 2 TABLETS BY MOUTH DAILY  60 tablet  11  . gabapentin (NEURONTIN) 300 MG capsule Take 1 capsule (300 mg total) by mouth 4 (four) times daily.  120 capsule  3  . glipiZIDE (GLUCOTROL) 10 MG tablet TAKE 1 TABLET BY MOUTH TWICE A DAY  60 tablet  11  . Hydrocodone-Acetaminophen 7.5-300 MG TABS Take 1 tablet by mouth 4 (four) times daily.  120 each  0  . metFORMIN (GLUCOPHAGE-XR) 750 MG 24 hr tablet TAKE 1 TABLET BY MOUTH DAILY WITH BREAKFAST.  30 tablet  11  . metoprolol (LOPRESSOR) 50 MG  tablet TAKE 1&1/2 TABLETS BY MOUTH TWICE DAILY FOR BLOOD PRESSURE  90 tablet  3  . morphine (MS CONTIN) 30 MG 12 hr tablet Take 1 tablet (30 mg total) by mouth daily.  30 tablet  0  . niacin (NIASPAN) 750 MG CR tablet Take 750 mg by mouth daily.      [name] omeprazole (PRILOSEC OTC) 20 MG tablet Take 20 mg by mouth 2 (two) times daily.         No current facility-administered medications for this visit.    Review of Systems Review of Systems All other review of systems negative or noncontributory except as stated in the HPI  Blood pressure 136/78, pulse 72, temperature 98 F (36.7 C), temperature source Temporal, resp. rate 18, height 5\' 7"  (1.702 m), weight 145 lb 4 oz (65.885 kg).  Physical Exam Physical Exam Physical Exam  Vitals reviewed. Constitutional: He is oriented to person, place, and time. He appears well-developed and well-nourished. No distress.  HENT:  Head: Normocephalic and atraumatic.  Mouth/Throat: No oropharyngeal exudate.  Eyes: Conjunctivae and EOM are normal. Pupils are equal, round, and reactive to light. Right eye exhibits no discharge. Left eye exhibits no discharge. No scleral icterus.  Neck: Normal range of motion. No tracheal deviation present.  Cardiovascular: Normal rate, regular rhythm and normal heart sounds.   Pulmonary/Chest: Effort normal and breath sounds normal. No stridor. No respiratory distress. He has no wheezes. He has no rales. He exhibits no tenderness.  Abdominal: Soft. Bowel sounds are normal. He exhibits no distension and no mass. There is no tenderness. There is no rebound and no guarding. He does have an abdominal bruit on auscultation but I do not appreciate any pulsatile mass.  He has a moderate sized, reducible RIH and a small LIH Musculoskeletal: Normal range of motion. He exhibits no edema and no tenderness.  Neurological: He is alert and oriented to person, place, and time.  Skin: Skin is warm and dry. No rash noted. He is not  diaphoretic. No erythema. No pallor.  Psychiatric: He has a normal mood and affect. His behavior is normal. Judgment and thought content normal.    Data Reviewed   Assessment    Moderate sized right inguinal hernia-symptomatic Probable small left inguinal hernia-asymptomatic Abdominal bruit, possible aortic aneurysm I agree that he does have a symptomatic right inguinal hernia on exam and he has a probable left inguinal hernia which is small and asymptomatic as well. We discussed the possibilities for repair including open and laparoscopic repair. I would probably recommend open right inguinal  hernia repair with mesh for this patient and follow the left hernia for now. However, he has an abdominal bruit on exam and is scheduled for an abdominal ultrasound tomorrow to evaluate for possible aortic aneurysm. This may take priority depending on the ultrasound results. If this is a sizable aneurysm, no recommend repair this prior to hernia repair. We have also recommended and requested his cardiologist to evaluate him for possible surgery and if the workup of his abdominal aneurysm shows that this will be a nonoperative management, we will go ahead and schedule him for surgery as soon as he receives clearance from his cardiologist    Plan     abdominal ultrasound Cardiology clearance We will schedule surgery after these for possible open right inguinal hernia with mesh        Bobby Henry Bobby Henry 02/18/2013, 9:28 AM

## 2013-03-17 NOTE — Preoperative (Signed)
Beta Blockers   Reason not to administer Beta Blockers:Not Applicable 

## 2013-03-17 NOTE — Interval H&P Note (Signed)
History and Physical Interval Note:  03/17/2013 7:20 AM  Bobby Mimes Sr.  has presented today for surgery, with the diagnosis of right inguinal hernia  The various methods of treatment have been discussed with the patient and family. After consideration of risks, benefits and other options for treatment, the patient has consented to  Procedure(s): HERNIA REPAIR INGUINAL ADULT (Right) INSERTION OF MESH (Right) as a surgical intervention .  The patient's history has been reviewed, patient examined, no change in status, stable for surgery.  I have reviewed the patient's chart and labs.  Questions were answered to the patient's satisfaction.  The patient was seen and evaluated in the preop area.  Site marked with the patient.  Risks of the procedure again discussed including infection, bleeding, pain, scarring, recurrence, injury to vas or testicle, nerve injury and persistent pain again discussed in lay terms.  He expressed understanding and would like to proceed with open RIH with mesh.    [name] DAVID

## 2013-03-19 ENCOUNTER — Encounter (HOSPITAL_COMMUNITY): Payer: Self-pay | Admitting: General Surgery

## 2013-03-24 ENCOUNTER — Other Ambulatory Visit: Payer: Self-pay | Admitting: Family Medicine

## 2013-04-07 ENCOUNTER — Encounter: Payer: Medicare Other | Admitting: Physical Medicine and Rehabilitation

## 2013-04-15 ENCOUNTER — Encounter: Payer: Medicare Other | Admitting: Physical Medicine and Rehabilitation

## 2013-04-16 ENCOUNTER — Ambulatory Visit (INDEPENDENT_AMBULATORY_CARE_PROVIDER_SITE_OTHER): Payer: Medicare Other | Admitting: General Surgery

## 2013-04-16 ENCOUNTER — Encounter (INDEPENDENT_AMBULATORY_CARE_PROVIDER_SITE_OTHER): Payer: Self-pay | Admitting: General Surgery

## 2013-04-16 VITALS — BP 134/80 | HR 60 | Temp 99.0°F | Resp 14 | Ht 66.0 in | Wt 147.2 lb

## 2013-04-16 DIAGNOSIS — Z4889 Encounter for other specified surgical aftercare: Secondary | ICD-10-CM

## 2013-04-16 DIAGNOSIS — Z5189 Encounter for other specified aftercare: Secondary | ICD-10-CM

## 2013-04-16 NOTE — Progress Notes (Signed)
Subjective:     Patient ID: Bobby Mimes Sr., male   DOB: Jul 20, 1946, 67 y.o.   MRN: [id number]  HPI In this patient follows up one month status post open repair of right inguinal hernia with mesh.he has no complaints says that he has recovered well from this procedure. He no longer has the symptoms that he had preoperatively and no longer notices any bulge in the groin. Overall he feels well.  Review of Systems     Objective:   Physical Exam His incision is healing nicely without sign of infection. There is no evidence of any recurrent hernia with Valsalva.    Assessment:     Status post open repair of right femoral hernia with mesh-doing well He is recovered very nice this procedure and there is no evidence of any postoperative complication. I recommended he can gradually increase his activity as tolerated and he can follow up with me on a when necessary basis     Plan:     He can gradually increase activity as tolerated and followup when necessary

## 2013-04-17 ENCOUNTER — Encounter: Payer: Self-pay | Admitting: Family Medicine

## 2013-04-18 ENCOUNTER — Other Ambulatory Visit: Payer: Self-pay | Admitting: Family Medicine

## 2013-04-19 NOTE — Telephone Encounter (Signed)
Sent, see when G wants to see him back/labs etc.

## 2013-04-22 ENCOUNTER — Encounter
Payer: Medicare Other | Attending: Physical Medicine and Rehabilitation | Admitting: Physical Medicine and Rehabilitation

## 2013-04-22 ENCOUNTER — Encounter: Payer: Self-pay | Admitting: Physical Medicine and Rehabilitation

## 2013-04-22 VITALS — BP 165/89 | HR 69 | Resp 16 | Ht 66.0 in | Wt 149.0 lb

## 2013-04-22 DIAGNOSIS — G8929 Other chronic pain: Secondary | ICD-10-CM | POA: Insufficient documentation

## 2013-04-22 DIAGNOSIS — M47817 Spondylosis without myelopathy or radiculopathy, lumbosacral region: Secondary | ICD-10-CM | POA: Insufficient documentation

## 2013-04-22 DIAGNOSIS — M961 Postlaminectomy syndrome, not elsewhere classified: Secondary | ICD-10-CM

## 2013-04-22 DIAGNOSIS — M170 Bilateral primary osteoarthritis of knee: Secondary | ICD-10-CM

## 2013-04-22 DIAGNOSIS — M1710 Unilateral primary osteoarthritis, unspecified knee: Secondary | ICD-10-CM | POA: Insufficient documentation

## 2013-04-22 MED ORDER — HYDROCODONE-ACETAMINOPHEN 7.5-300 MG PO TABS: 1.0000 | ORAL_TABLET | Freq: Four times a day (QID) | ORAL | Status: DC

## 2013-04-22 MED ORDER — MORPHINE SULFATE ER 30 MG PO TBCR: 30.0000 mg | EXTENDED_RELEASE_TABLET | Freq: Every day | ORAL | Status: DC

## 2013-04-22 NOTE — Patient Instructions (Signed)
Continue with staying as active as tolerated 

## 2013-04-22 NOTE — Progress Notes (Signed)
Subjective:    Patient ID: Bobby Mimes Sr., male    DOB: November 13, 1946, 67 y.o.   MRN: [id number]  HPI The patient complains about chronic low back pain which radiates into the left lateral thigh to the knee. The patient also complains about numbness and tingling in the same area.the patient also complains about right knee pain.  The problem has been stable . The patient states, that he fell on the ice , and stretched his adductors a little, he also hurt his left wrist.  X-ray of his left wrist, did not show any acute findings, but he had an old scaphoid fracture. He was also diagnosed with a inguinal hernia, bilateral. He had surgery on 03/16/2013. The patient reports, that the  surgery went well, the patient was already seen for a follow up, the surgeon was very pleased with the recovery, and released the patient without restrictions/ per patient. He has tricompartmental osteoarthritis of the right knee.  He has had 2 previous lumbar spine operations and a cervical spine  operation. The last lumbar surgery was back in 2005 by Dr. Danielle Dess, L2-3  Diskectomy. The patient has seen Dr. Enid Baas PA, who recommended a right knee replacement, the patient's wife had lumbar surgery, and the patient has not followed up on it yet.  Pain Inventory Average Pain 7 Pain Right Now 6 My pain is constant, sharp, burning and stabbing  In the last 24 hours, has pain interfered with the following? General activity 7 Relation with others 8 Enjoyment of life 9 What TIME of day is your pain at its worst? all the time Sleep (in general) Poor  Pain is worse with: walking, bending, sitting, inactivity and standing Pain improves with: n/a Relief from Meds: 4  Mobility use a cane how many minutes can you walk? 5-10 ability to climb steps?  no do you drive?  yes Do you have any goals in this area?  yes  Function disabled: date disabled 26  Neuro/Psych trouble walking  Prior Studies bone  scan x-rays CT/MRI nerve study hernia repair  Physicians involved in your care Any changes since last visit?  no   Family History  Problem Relation Age of Onset  . Heart attack Father     CABG 6v  . Arthritis Father   . Alzheimer's disease Mother   . Arthritis Mother   . Bipolar disorder Sister   . Bipolar disorder Sister   . Cancer Neg Hx   . Diabetes Neg Hx   . Stroke Neg Hx    History   Social History  . Marital Status: Married    Spouse Name: N/A    Number of Children: N/A  . Years of Education: N/A   Occupational History  . Retired/disabled    Social History Main Topics  . Smoking status: Current Some Day Smoker -- 0.30 packs/day for 33 years    Types: Cigarettes  . Smokeless tobacco: Never Used  . Alcohol Use: No  . Drug Use: No  . Sexually Active: None   Other Topics Concern  . 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 Nose surgery  1982  . Neck surgery  1984  . Knee surgery Bilateral 1997    menisectomy  . Carotid endarterectomy Right 5/09  with dacron patch angioplasty with resection of redundant carotid artery with primary reanastomosis  . Esophagogastroduodenoscopy  2007    deformed pylorus; gastric ulcer  . Gastrojejunostomy  4/09    laparoscopic; and high selective anterior and posterior vagotomies  . Back surgery    . Cervical spine surgery    . Colonoscopy  2008    diverticulosis (Dr. Bosie Clos) repeat 2018  . Cardiac catheterization  13  . Inguinal hernia repair Right 03/17/2013    Procedure: HERNIA REPAIR INGUINAL ADULT;  Surgeon: [name], DO;  Location: [facility] OR;  Service: General;  Laterality: Right;  . Insertion of mesh Right 03/17/2013    Procedure: INSERTION OF MESH ;  Surgeon: [name], DO;  Location: [facility] OR;  Service: General;  Laterality: Right;    Past Medical History  Diagnosis Date  . HTN (hypertension)   . HLD (hyperlipidemia)   . DMII (diabetes mellitus, type 2)   . GERD (gastroesophageal reflux disease)   . PUD (peptic ulcer disease)   . Tobacco abuse   . Hx-TIA (transient ischemic attack) 2009  . Chronic low back pain   . Psychosexual dysfunction with inhibited sexual excitement   . Carotid stenosis     s/p CEA - Dr. Hart Rochester  . Osteoarthritis   . Solitary pulmonary nodule     solitary pulmonary nodule (enlarged 2011)-follow up - stable on 2012 CT Delford Field)  . Embolism and thrombosis of unspecified site   . Diverticulosis   . 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 Inguinal hernia   . Atherosclerosis of arteries 02/2013    severe stenosis of SMA, celiac, moderate bilateral common iliac stenosis  . Myocardial infarction     ?  . Stroke     tia hx  . 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  Musculoskeletal: Positive for gait problem.  All other systems reviewed and are negative.       Objective:   Physical Exam Constitutional: He is oriented to person, place, and time. He appears well-developed and well-nourished.  HENT:  Head: Normocephalic.  Neck: Neck supple.  Musculoskeletal: He exhibits tenderness.  Neurological: He is alert and oriented to person, place, and time.  Skin: Skin is warm and dry.  Psychiatric: He has a normal mood and affect.  Symmetric normal motor tone is noted throughout. Normal muscle bulk, except atrophy in thigh muscles bilateral. Muscle testing reveals 5/5 muscle strength of the upper extremity, and 5/5 of the lower extremity. Full range of  motion in upper and lower extremities, except decrease ROM in right knee, extension deficit of 20 degrees. ROM of spine is restricted. Fine motor movements are normal in both hands.  Sensory is intact and symmetric to light touch, pinprick and proprioception.  DTR in the upper and lower extremity are present and symmetric 2+. No clonus is noted.  Patient arises from chair without difficulty. Wide based gait with a limp and with a cane, kyphotic posture because of hernia pain. Able to stand on heels and toes . Tandem walk is possible with help, but unstable. No pronator drift. Rhomberg  negative        Assessment & Plan:  1. Lumbar spondylosisunderwent facet block 03/10/2012 Kirsten's  2. Lumbar postlaminectomy syndrome  3. Tricompartmental right knee osteoarthritis./Right knee pain.Patient has seen Dr. Enid Baas PA, who recommended a TKR .  4. Inguinal hernia, bilateral, surgery scheduled on 03/16/2013, surgery went well, the patient was already seen for a follow up, the surgeon was very pleased with the recovery, and released the patient without restrictions/ per patient.  He continues to use a cane for ambulation.  The patient's last UDS was negative for opioids, he states, that he ran out off his medication 2 days early, also the test was evaluated one day after he gave his sample, at the last visit. I educated the patient, that he should take the medication as prescribed, and if this happens again, we would not prescribe narcotics any more. The patient understood and agreed.Patient was out of his medication early again, I educated him again, that he should take his medication as prescribed. He stated, that he has severe pain because of his hernias, I told him that he should have called [place] and report. I told him that this is the last time that we are accepting this, if this happens one more time, we will d/c his narcotics.  Today the patient brought a prescription for oxycodone 5mg , he got  prescribed by his surgeon Dr. Biagio Quint, which he did not fill. Refilled his MS-Contin and Hydrocodone today.

## 2013-04-28 ENCOUNTER — Other Ambulatory Visit: Payer: Self-pay | Admitting: Family Medicine

## 2013-05-09 ENCOUNTER — Other Ambulatory Visit: Payer: Self-pay | Admitting: Family Medicine

## 2013-05-19 ENCOUNTER — Telehealth: Payer: Self-pay | Admitting: *Deleted

## 2013-05-19 NOTE — Telephone Encounter (Signed)
Needs to reschedule appt. April notified to reschedule

## 2013-05-20 ENCOUNTER — Encounter: Payer: Medicare Other | Admitting: Physical Medicine and Rehabilitation

## 2013-05-23 DIAGNOSIS — M545 Low back pain, unspecified: Secondary | ICD-10-CM | POA: Insufficient documentation

## 2013-05-23 DIAGNOSIS — Z862 Personal history of diseases of the blood and blood-forming organs and certain disorders involving the immune mechanism: Secondary | ICD-10-CM | POA: Insufficient documentation

## 2013-05-23 DIAGNOSIS — I6529 Occlusion and stenosis of unspecified carotid artery: Secondary | ICD-10-CM | POA: Insufficient documentation

## 2013-05-23 DIAGNOSIS — R259 Unspecified abnormal involuntary movements: Secondary | ICD-10-CM | POA: Insufficient documentation

## 2013-05-23 DIAGNOSIS — M6281 Muscle weakness (generalized): Secondary | ICD-10-CM | POA: Insufficient documentation

## 2013-05-23 DIAGNOSIS — Z8719 Personal history of other diseases of the digestive system: Secondary | ICD-10-CM | POA: Insufficient documentation

## 2013-05-23 DIAGNOSIS — I252 Old myocardial infarction: Secondary | ICD-10-CM | POA: Insufficient documentation

## 2013-05-23 DIAGNOSIS — Z7982 Long term (current) use of aspirin: Secondary | ICD-10-CM | POA: Insufficient documentation

## 2013-05-23 DIAGNOSIS — Z8739 Personal history of other diseases of the musculoskeletal system and connective tissue: Secondary | ICD-10-CM | POA: Insufficient documentation

## 2013-05-23 DIAGNOSIS — E785 Hyperlipidemia, unspecified: Secondary | ICD-10-CM | POA: Insufficient documentation

## 2013-05-23 DIAGNOSIS — Z888 Allergy status to other drugs, medicaments and biological substances status: Secondary | ICD-10-CM | POA: Insufficient documentation

## 2013-05-23 DIAGNOSIS — E119 Type 2 diabetes mellitus without complications: Secondary | ICD-10-CM | POA: Insufficient documentation

## 2013-05-23 DIAGNOSIS — K219 Gastro-esophageal reflux disease without esophagitis: Secondary | ICD-10-CM | POA: Insufficient documentation

## 2013-05-23 DIAGNOSIS — F528 Other sexual dysfunction not due to a substance or known physiological condition: Secondary | ICD-10-CM | POA: Insufficient documentation

## 2013-05-23 DIAGNOSIS — F17200 Nicotine dependence, unspecified, uncomplicated: Secondary | ICD-10-CM | POA: Insufficient documentation

## 2013-05-23 DIAGNOSIS — Z8639 Personal history of other endocrine, nutritional and metabolic disease: Secondary | ICD-10-CM | POA: Insufficient documentation

## 2013-05-23 DIAGNOSIS — Z79899 Other long term (current) drug therapy: Secondary | ICD-10-CM | POA: Insufficient documentation

## 2013-05-23 DIAGNOSIS — R4789 Other speech disturbances: Secondary | ICD-10-CM | POA: Insufficient documentation

## 2013-05-23 DIAGNOSIS — M1990 Unspecified osteoarthritis, unspecified site: Secondary | ICD-10-CM | POA: Insufficient documentation

## 2013-05-23 DIAGNOSIS — I10 Essential (primary) hypertension: Secondary | ICD-10-CM | POA: Insufficient documentation

## 2013-05-23 DIAGNOSIS — Z8673 Personal history of transient ischemic attack (TIA), and cerebral infarction without residual deficits: Secondary | ICD-10-CM | POA: Insufficient documentation

## 2013-05-23 DIAGNOSIS — G8929 Other chronic pain: Secondary | ICD-10-CM | POA: Insufficient documentation

## 2013-05-24 ENCOUNTER — Emergency Department (HOSPITAL_COMMUNITY): Payer: Medicare Other

## 2013-05-24 ENCOUNTER — Encounter (HOSPITAL_COMMUNITY): Payer: Self-pay | Admitting: Emergency Medicine

## 2013-05-24 ENCOUNTER — Emergency Department (HOSPITAL_COMMUNITY)
Admission: EM | Admit: 2013-05-24 | Discharge: 2013-05-24 | Disposition: A | Discharge status: Home / Self Care | Payer: Medicare Other | Attending: Emergency Medicine | Admitting: Emergency Medicine

## 2013-05-24 DIAGNOSIS — R531 Weakness: Secondary | ICD-10-CM

## 2013-05-24 DIAGNOSIS — R251 Tremor, unspecified: Secondary | ICD-10-CM

## 2013-05-24 LAB — CBC
HCT: 40.3 % (ref 39.0–52.0)
Hemoglobin: 13.7 g/dL (ref 13.0–17.0)
MCH: 29 pg (ref 26.0–34.0)
MCHC: 34 g/dL (ref 30.0–36.0)
MCV: 85.4 fL (ref 78.0–100.0)
RDW: 13.5 % (ref 11.5–15.5)

## 2013-05-24 LAB — COMPREHENSIVE METABOLIC PANEL
AST: 12 U/L (ref 0–37)
Albumin: 3.4 g/dL — ABNORMAL LOW (ref 3.5–5.2)
BUN: 54 mg/dL — ABNORMAL HIGH (ref 6–23)
Calcium: 8.9 mg/dL (ref 8.4–10.5)
Chloride: 96 mEq/L (ref 96–112)
Creatinine, Ser: 5.46 mg/dL — ABNORMAL HIGH (ref 0.50–1.35)
GFR calc non Af Amer: 10 mL/min — ABNORMAL LOW (ref >90)
Total Bilirubin: 0.2 mg/dL — ABNORMAL LOW (ref 0.3–1.2)

## 2013-05-24 LAB — DIFFERENTIAL
Basophils Absolute: 0.1 10*3/uL (ref 0.0–0.1)
Basophils Relative: 1 % (ref 0–1)
Eosinophils Relative: 7 % — ABNORMAL HIGH (ref 0–5)
Monocytes Absolute: 1.2 10*3/uL — ABNORMAL HIGH (ref 0.1–1.0)
Monocytes Relative: 10 % (ref 3–12)
Neutro Abs: 7.7 10*3/uL (ref 1.7–7.7)

## 2013-05-24 LAB — POCT I-STAT TROPONIN I: Troponin i, poc: 0 ng/mL (ref 0.00–0.08)

## 2013-05-24 LAB — TROPONIN I: Troponin I: <0.3 ng/mL (ref <0.30)

## 2013-05-24 MED ORDER — POTASSIUM CHLORIDE CRYS ER 20 MEQ PO TBCR
40.0000 meq | EXTENDED_RELEASE_TABLET | Freq: Once | ORAL | Status: AC
  Administered 2013-05-24: 40 meq via ORAL
  Filled 2013-05-24: qty 2

## 2013-05-24 NOTE — ED Notes (Signed)
MD at bedside. 

## 2013-05-24 NOTE — ED Notes (Signed)
Patient with stroke symptoms for last three weeks.  Patient states he has been having some trembling, dropping things, feeling weak.

## 2013-05-24 NOTE — ED Notes (Signed)
Pt ambulated without difficulty

## 2013-05-24 NOTE — ED Notes (Signed)
Patient in CT

## 2013-05-24 NOTE — ED Notes (Signed)
Patient reports intermittent (during the day) bilateral hand numbness/weaknes, shaking and occasionally dropping things x 3 weeks. Also reports some intermittent slurred speech. Denies any unilateral weakness, difficulty finding words, memory impairment, difficulty ambulating, dizziness or headaches. Hx HTN, DM, CVA with no reported residual. Reports having had right sided carotid surgery d/t blockage. 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 No changes in medication. No CP or SOB. Patient is AAOx4, resp e/u, NAD, neuro intact.

## 2013-05-24 NOTE — ED Provider Notes (Signed)
History     CSN: [id number]  Arrival date & time 05/23/13  2354   First MD Initiated Contact with Patient 05/24/13 [phone]      Chief Complaint  Patient presents with  . Stroke Symptoms    (Consider location/radiation/quality/duration/timing/severity/associated sxs/prior treatment) HPI History provided by patient and his son bedside. The last 3 weeks has been having on and off symptoms of slurred speech, trembling and generalized weakness. Patient has history of tobacco use, hypertension, lipidemia, diabetes, stroke.  His main concern tonight is that he may have had another stroke. He denies any unilateral symptoms. He gets the symptoms daily and is unable to say how long it lasts. He states he has difficulty finding words, although right now is not having any difficulty. He usually has tremors in both of his hands which causes him to drop things like his cigarettes. Right now this seems to be somewhat better. No headache. No change in medications. Patient and his son both feel like he is under a lot of increased stress at home which may be contributing to his symptoms.  Past Medical History  Diagnosis Date  . HTN (hypertension)   . HLD (hyperlipidemia)   . DMII (diabetes mellitus, type 2)   . GERD (gastroesophageal reflux disease)   . PUD (peptic ulcer disease)   . Tobacco abuse   . Hx-TIA (transient ischemic attack) 2009  . Chronic low back pain   . Psychosexual dysfunction with inhibited sexual excitement   . Carotid stenosis     s/p CEA - Dr. Hart Rochester  . Osteoarthritis   . Solitary pulmonary nodule     solitary pulmonary nodule (enlarged 2011)-follow up - stable on 2012 CT Delford Field)  . Embolism and thrombosis of unspecified site   . Diverticulosis   . 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 Inguinal hernia   . Atherosclerosis of arteries 02/2013    severe stenosis of SMA, celiac, moderate bilateral common iliac stenosis  . Myocardial  infarction     ?  . Stroke     tia hx  . Left wrist fracture 01/2013    evidence of old scaphoid fracture with scapholunate advanced collapse Marry Guan) - declined ortho referral    Past Surgical History  Procedure Laterality Date  . Nose surgery  1982  . Neck surgery  1984  . Knee surgery Bilateral 1997    menisectomy  . Carotid endarterectomy Right 5/09    with dacron patch angioplasty with resection of redundant carotid artery with primary reanastomosis  . Esophagogastroduodenoscopy  2007    deformed pylorus; gastric ulcer  . Gastrojejunostomy  4/09    laparoscopic; and high selective anterior and posterior vagotomies  . Back surgery    . Cervical spine surgery    . Colonoscopy  2008    diverticulosis (Dr. Bosie Clos) repeat 2018  . Cardiac catheterization  13  . Inguinal hernia repair Right 03/17/2013    Procedure: HERNIA REPAIR INGUINAL ADULT;  Surgeon: [name], DO;  Location: [facility] OR;  Service: General;  Laterality: Right;  . Insertion of mesh Right 03/17/2013    Procedure: INSERTION OF MESH ;  Surgeon: [name], DO;  Location: [facility] OR;  Service: General;  Laterality: Right;    Family History  Problem Relation Age of Onset  . Heart attack Father     CABG 6v  . Arthritis Father   . Alzheimer's disease Mother   . Arthritis Mother   .  Bipolar disorder Sister   . Bipolar disorder Sister   . Cancer Neg Hx   . Diabetes Neg Hx   . Stroke Neg Hx     History  Substance Use Topics  . Smoking status: Current Some Day Smoker -- 0.30 packs/day for 33 years    Types: Cigarettes  . Smokeless tobacco: Never Used  . Alcohol Use: No      Review of Systems  Constitutional: Negative for fever and chills.  HENT: Negative for neck pain and neck stiffness.   Eyes: Negative for visual disturbance.  Respiratory: Negative for shortness of breath.   Cardiovascular: Negative for chest pain.  Gastrointestinal: Negative for abdominal pain.  Genitourinary: Negative for dysuria.   Musculoskeletal: Negative for back pain.  Skin: Negative for rash.  Neurological: Positive for tremors and speech difficulty. Negative for headaches.  All other systems reviewed and are negative.    Allergies  Varenicline tartrate  Home Medications   Current Outpatient Rx  Name  Route  Sig  Dispense  Refill  . amLODipine (NORVASC) 10 MG tablet      TAKE 1 TABLET BY MOUTH EVERY DAY   30 tablet   3   . aspirin EC 81 MG tablet   Oral   Take 81 mg by mouth daily.         . enalapril-hydrochlorothiazide (VASERETIC) 10-25 MG per tablet   Oral   Take 2 tablets by mouth daily.         . enalapril-hydrochlorothiazide (VASERETIC) 10-25 MG per tablet      TAKE 2 TABLETS BY MOUTH DAILY   60 tablet   6   . gabapentin (NEURONTIN) 300 MG capsule   Oral   Take 300 mg by mouth 4 (four) times daily.         [name] glipiZIDE (GLUCOTROL) 10 MG tablet   Oral   Take 10 mg by mouth 2 (two) times daily before a meal.         . Hydrocodone-Acetaminophen 7.5-300 MG TABS   Oral   Take 1 tablet by mouth 4 (four) times daily.   120 each   0   . metFORMIN (GLUCOPHAGE-XR) 750 MG 24 hr tablet   Oral   Take 750 mg by mouth daily with breakfast.         . metFORMIN (GLUCOPHAGE-XR) 750 MG 24 hr tablet      TAKE 1 TABLET BY MOUTH DAILY WITH BREAKFAST.   30 tablet   2   . metoprolol (LOPRESSOR) 50 MG tablet   Oral   Take 75 mg by mouth 2 (two) times daily.         . metoprolol (LOPRESSOR) 50 MG tablet      TAKE 1&1/2 TABLETS BY MOUTH TWICE DAILY FOR BLOOD PRESSURE   135 tablet   3   . morphine (MS CONTIN) 30 MG 12 hr tablet   Oral   Take 1 tablet (30 mg total) by mouth daily.   30 tablet   0   . omeprazole (PRILOSEC OTC) 20 MG tablet   Oral   Take 20 mg by mouth 2 (two) times daily.          . rosuvastatin (CRESTOR) 10 MG tablet   Oral   Take 10 mg by mouth every evening.           BP 147/73  Pulse 65  Temp(Src) 99.1 F (37.3 C) (Oral)  Resp 18  SpO2  98%  Physical Exam  Constitutional: He is oriented to person, place, and time. He appears well-developed and well-nourished.  HENT:  Head: Normocephalic and atraumatic.  Eyes: Conjunctivae and EOM are normal. Pupils are equal, round, and reactive to light.  Neck: Full passive range of motion without pain. Neck supple. No thyromegaly present.  No meningismus  Cardiovascular: Normal rate, regular rhythm, S1 normal, S2 normal and intact distal pulses.   Pulmonary/Chest: Effort normal and breath sounds normal. No respiratory distress.  Abdominal: Soft. Bowel sounds are normal. There is no tenderness. There is no CVA tenderness.  Musculoskeletal: Normal range of motion. He exhibits no edema.  Neurological: He is alert and oriented to person, place, and time. He has normal strength and normal reflexes. He displays normal reflexes. No cranial nerve deficit or sensory deficit. He exhibits normal muscle tone. He displays a negative Romberg sign. Coordination normal. GCS eye subscore is 4. GCS verbal subscore is 5. GCS motor subscore is 6.  Normal Gait. Speech clear no deficits. Grips equal with mild bilateral upper extremity tremor present  Skin: Skin is warm and dry. No rash noted. No cyanosis. Nails show no clubbing.  Psychiatric: He has a normal mood and affect. His speech is normal and behavior is normal.    ED Course  Procedures (including critical care time)  Results for orders placed during the hospital encounter of 05/24/13  PROTIME-INR      Result Value Range   Prothrombin Time 13.0  11.6 - 15.2 seconds   INR 0.99  0.00 - 1.49  APTT      Result Value Range   aPTT 31  24 - 37 seconds  CBC      Result Value Range   WBC 11.9 (*) 4.0 - 10.5 K/uL   RBC 4.72  4.22 - 5.81 MIL/uL   Hemoglobin 13.7  13.0 - 17.0 g/dL   HCT 16.1  09.6 - 04.5 %   MCV 85.4  78.0 - 100.0 fL   MCH 29.0  26.0 - 34.0 pg   MCHC 34.0  30.0 - 36.0 g/dL   RDW 40.9  81.1 - 91.4 %   Platelets 271  150 - 400 K/uL   DIFFERENTIAL      Result Value Range   Neutrophils Relative % 65  43 - 77 %   Neutro Abs 7.7  1.7 - 7.7 K/uL   Lymphocytes Relative 19  12 - 46 %   Lymphs Abs 2.2  0.7 - 4.0 K/uL   Monocytes Relative 10  3 - 12 %   Monocytes Absolute 1.2 (*) 0.1 - 1.0 K/uL   Eosinophils Relative 7 (*) 0 - 5 %   Eosinophils Absolute 0.8 (*) 0.0 - 0.7 K/uL   Basophils Relative 1  0 - 1 %   Basophils Absolute 0.1  0.0 - 0.1 K/uL  COMPREHENSIVE METABOLIC PANEL      Result Value Range   Sodium 137  135 - 145 mEq/L   Potassium 3.1 (*) 3.5 - 5.1 mEq/L   Chloride 96  96 - 112 mEq/L   CO2 28  19 - 32 mEq/L   Glucose, Bld 171 (*) 70 - 99 mg/dL   BUN 54 (*) 6 - 23 mg/dL   Creatinine, Ser 7.82 (*) 0.50 - 1.35 mg/dL   Calcium 8.9  8.4 - 95.6 mg/dL   Total Protein 6.9  6.0 - 8.3 g/dL   Albumin 3.4 (*) 3.5 - 5.2 g/dL   AST 12  0 - 37 U/L  ALT 7  0 - 53 U/L   Alkaline Phosphatase 81  39 - 117 U/L   Total Bilirubin 0.2 (*) 0.3 - 1.2 mg/dL   GFR calc non Af Amer 10 (*) >90 mL/min   GFR calc Af Amer 11 (*) >90 mL/min  TROPONIN I      Result Value Range   Troponin I <0.30  <0.30 ng/mL  POCT I-STAT TROPONIN I      Result Value Range   Troponin i, poc 0.00  0.00 - 0.08 ng/mL   Comment 3            Ct Head (brain) Wo Contrast  05/24/2013   *RADIOLOGY REPORT*  Clinical Data:  Stroke.  Difficulty speaking.  Weakness.  CT HEAD WITHOUT CONTRAST  Technique: Contiguous axial images were obtained from the base of the skull through the vertex without contrast  Comparison: 07/21/2008  Findings:  There is no evidence of intracranial hemorrhage, brain edema, or other signs of acute infarction.  There is no evidence of intracranial mass lesion or mass effect.  No abnormal extraaxial fluid collections are identified.  There is no evidence of hydrocephalus, or other significant intracranial abnormality.  No skull abnormality identified.  IMPRESSION: Negative non-contrast head CT.   Original Report Authenticated By: Myles Rosenthal,  M.D.    Date: 05/24/2013  Rate: 55  Rhythm: sinus bradycardia  QRS Axis: normal  Intervals: normal  ST/T Wave abnormalities: nonspecific ST changes  Conduction Disutrbances:none  Narrative Interpretation:   Old EKG Reviewed: unchanged  Potassium, PO fluids, normal gait and serial neuro exams. ACT involved, provided outpatient resources/ referrals   Plan d/c home and close PCP f/u. PT states understanding strict return precautions, stable for d/c home. Neurology referral for tremor.  MDM  Multiple complaints including tremor, trouble with speech, generalized weakness x 3 weeks that patient attributes to stress at home. He is also concerned about possible stroke. CT, albs, ECG, IVFs and potassium. VS and nursing notes reviewed.         Sunnie Nielsen, MD 05/24/13 [phone]

## 2013-05-25 ENCOUNTER — Encounter: Payer: Medicare Other | Admitting: Physical Medicine and Rehabilitation

## 2013-05-26 ENCOUNTER — Encounter: Payer: Self-pay | Admitting: Physical Medicine and Rehabilitation

## 2013-05-26 ENCOUNTER — Encounter
Payer: Medicare Other | Attending: Physical Medicine and Rehabilitation | Admitting: Physical Medicine and Rehabilitation

## 2013-05-26 VITALS — BP 165/89 | HR 67 | Resp 16 | Ht 67.0 in | Wt 149.0 lb

## 2013-05-26 DIAGNOSIS — M545 Low back pain, unspecified: Secondary | ICD-10-CM | POA: Insufficient documentation

## 2013-05-26 DIAGNOSIS — K219 Gastro-esophageal reflux disease without esophagitis: Secondary | ICD-10-CM | POA: Insufficient documentation

## 2013-05-26 DIAGNOSIS — M25569 Pain in unspecified knee: Secondary | ICD-10-CM | POA: Insufficient documentation

## 2013-05-26 DIAGNOSIS — M961 Postlaminectomy syndrome, not elsewhere classified: Secondary | ICD-10-CM | POA: Insufficient documentation

## 2013-05-26 DIAGNOSIS — G8929 Other chronic pain: Secondary | ICD-10-CM | POA: Insufficient documentation

## 2013-05-26 DIAGNOSIS — R209 Unspecified disturbances of skin sensation: Secondary | ICD-10-CM | POA: Insufficient documentation

## 2013-05-26 DIAGNOSIS — Z8673 Personal history of transient ischemic attack (TIA), and cerebral infarction without residual deficits: Secondary | ICD-10-CM | POA: Insufficient documentation

## 2013-05-26 DIAGNOSIS — F17200 Nicotine dependence, unspecified, uncomplicated: Secondary | ICD-10-CM | POA: Insufficient documentation

## 2013-05-26 DIAGNOSIS — K4090 Unilateral inguinal hernia, without obstruction or gangrene, not specified as recurrent: Secondary | ICD-10-CM | POA: Insufficient documentation

## 2013-05-26 DIAGNOSIS — E119 Type 2 diabetes mellitus without complications: Secondary | ICD-10-CM | POA: Insufficient documentation

## 2013-05-26 DIAGNOSIS — M1710 Unilateral primary osteoarthritis, unspecified knee: Secondary | ICD-10-CM | POA: Insufficient documentation

## 2013-05-26 DIAGNOSIS — Z86718 Personal history of other venous thrombosis and embolism: Secondary | ICD-10-CM | POA: Insufficient documentation

## 2013-05-26 DIAGNOSIS — I10 Essential (primary) hypertension: Secondary | ICD-10-CM | POA: Insufficient documentation

## 2013-05-26 DIAGNOSIS — M47817 Spondylosis without myelopathy or radiculopathy, lumbosacral region: Secondary | ICD-10-CM | POA: Insufficient documentation

## 2013-05-26 MED ORDER — MORPHINE SULFATE ER 30 MG PO TBCR: 30.0000 mg | EXTENDED_RELEASE_TABLET | Freq: Every day | ORAL | Status: DC

## 2013-05-26 MED ORDER — HYDROCODONE-ACETAMINOPHEN 7.5-300 MG PO TABS: 1.0000 | ORAL_TABLET | Freq: Four times a day (QID) | ORAL | Status: DC | PRN

## 2013-05-26 NOTE — Progress Notes (Signed)
Subjective:    Patient ID: Bobby Mimes Sr., male    DOB: 1946-04-08, 67 y.o.   MRN: [id number]  HPI The patient complains about chronic low back pain which radiates into the left lateral thigh to the knee. The patient also complains about numbness and tingling in the same area.the patient also complains about right knee pain.  The problem has been stable . The patient states, that he fell on the ice , and stretched his adductors a little, he also hurt his left wrist.  X-ray of his left wrist, did not show any acute findings, but he had an old scaphoid fracture. He was also diagnosed with a inguinal hernia, bilateral. He had surgery on 03/16/2013. The patient reports, that the surgery went well, the patient was already seen for a follow up, the surgeon was very pleased with the recovery, and released the patient without restrictions/ per patient.  He has tricompartmental osteoarthritis of the right knee.  He has had 2 previous lumbar spine operations and a cervical spine  operation. The last lumbar surgery was back in 2005 by Dr. Danielle Dess, L2-3  Diskectomy. The patient has seen Dr. Enid Baas PA, who recommended a right knee replacement, the patient's wife had lumbar surgery, she also has severe COPD,he does not feel that he can leave her alone, and the patient has not followed up on it yet.  Pain Inventory Average Pain 6 Pain Right Now 5 My pain is constant, sharp and burning  In the last 24 hours, has pain interfered with the following? General activity 3 Relation with others 4 Enjoyment of life 3 What TIME of day is your pain at its worst? n Sleep (in general) Poor  Pain is worse with: walking, bending, sitting, inactivity and standing Pain improves with: NA Relief from Meds: 6  Mobility walk with assistance use a cane how many minutes can you walk? 5 ability to climb steps?  no do you drive?  yes  Function disabled: date disabled na Do you have any goals in this area?   yes  Neuro/Psych No problems in this area  Prior Studies Any changes since last visit?  no  Physicians involved in your care Any changes since last visit?  no   Family History  Problem Relation Age of Onset  . Heart attack Father     CABG 6v  . Arthritis Father   . Alzheimer's disease Mother   . Arthritis Mother   . Bipolar disorder Sister   . Bipolar disorder Sister   . Cancer Neg Hx   . Diabetes Neg Hx   . Stroke Neg Hx    History   Social History  . Marital Status: Married    Spouse Name: N/A    Number of Children: N/A  . Years of Education: N/A   Occupational History  . Retired/disabled    Social History Main Topics  . Smoking status: Current Some Day Smoker -- 0.30 packs/day for 33 years    Types: Cigarettes  . Smokeless tobacco: Never Used  . Alcohol Use: No  . Drug Use: No  . Sexually Active: None   Other Topics Concern  . 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 Nose surgery  1982  . Neck surgery  1984  . Knee  surgery Bilateral 1997    menisectomy  . Carotid endarterectomy Right 5/09    with dacron patch angioplasty with resection of redundant carotid artery with primary reanastomosis  . Esophagogastroduodenoscopy  2007    deformed pylorus; gastric ulcer  . Gastrojejunostomy  4/09    laparoscopic; and high selective anterior and posterior vagotomies  . Back surgery    . Cervical spine surgery    . Colonoscopy  2008    diverticulosis (Dr. Bosie Clos) repeat 2018  . Cardiac catheterization  13  . Inguinal hernia repair Right 03/17/2013    Procedure: HERNIA REPAIR INGUINAL ADULT;  Surgeon: [name], DO;  Location: [facility] OR;  Service: General;  Laterality: Right;  . Insertion of mesh Right 03/17/2013    Procedure: INSERTION OF MESH ;  Surgeon: [name], DO;  Location: [facility]  OR;  Service: General;  Laterality: Right;   Past Medical History  Diagnosis Date  . HTN (hypertension)   . HLD (hyperlipidemia)   . DMII (diabetes mellitus, type 2)   . GERD (gastroesophageal reflux disease)   . PUD (peptic ulcer disease)   . Tobacco abuse   . Hx-TIA (transient ischemic attack) 2009  . Chronic low back pain   . Psychosexual dysfunction with inhibited sexual excitement   . Carotid stenosis     s/p CEA - Dr. Hart Rochester  . Osteoarthritis   . Solitary pulmonary nodule     solitary pulmonary nodule (enlarged 2011)-follow up - stable on 2012 CT Delford Field)  . Embolism and thrombosis of unspecified site   . Diverticulosis   . 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 Inguinal hernia   . Atherosclerosis of arteries 02/2013    severe stenosis of SMA, celiac, moderate bilateral common iliac stenosis  . Myocardial infarction     ?  . Stroke     tia hx  . Left wrist fracture 01/2013    evidence of old scaphoid fracture with scapholunate advanced collapse (SLAC) - declined ortho referral   BP 165/89  Pulse 67  Resp 16  Ht 5\' 7"  (1.702 m)  Wt 149 lb (67.586 kg)  BMI 23.33 kg/m2  SpO2 95%     Review of Systems  All other systems reviewed and are negative.       Objective:   Physical Exam Constitutional: He is oriented to person, place, and time. He appears well-developed and well-nourished.  HENT:  Head: Normocephalic.  Neck: Neck supple.  Musculoskeletal: He exhibits tenderness.  Neurological: He is alert and oriented to person, place, and time.  Skin: Skin is warm and dry.  Psychiatric: He has a normal mood and affect.  Symmetric normal motor tone is noted throughout. Normal muscle bulk, except atrophy in thigh muscles bilateral. Muscle testing reveals 5/5 muscle strength of the upper extremity, and 5/5 of the lower extremity. Full range of motion in upper and lower extremities, except decrease ROM in right knee, extension  deficit of 20 degrees. ROM of spine is restricted. Fine motor movements are normal in both hands.  Sensory is intact and symmetric to light touch, pinprick and proprioception.  DTR in the upper and lower extremity are present and symmetric 2+. No clonus is noted.  Patient arises from chair without difficulty. Wide based gait with a limp and with a cane, kyphotic posture because of hernia pain. Able to stand on heels and toes . Tandem walk is possible with help, but unstable. No pronator drift. Rhomberg  negative        Assessment & Plan:  1. Lumbar spondylosisunderwent facet block 03/10/2012 [facility]  2. Lumbar postlaminectomy syndrome  3. Tricompartmental right knee osteoarthritis./Right knee pain.Patient has seen Dr. Enid Baas PA, who recommended a TKR .  4. Inguinal hernia, bilateral, surgery scheduled on 03/16/2013, surgery went well, the patient was already seen for a follow up, the surgeon was very pleased with the recovery, and released the patient without restrictions/ per patient.  He continues to use a cane for ambulation.  The patient's last UDS was negative for opioids, he states, that he ran out off his medication 2 days early, also the test was evaluated one day after he gave his sample, at the last visit. I educated the patient, that he should take the medication as prescribed, and if this happens again, we would not prescribe narcotics any more. The patient understood and agreed.Patient was out of his medication early again, I educated him again, that he should take his medication as prescribed. He stated, that he has severe pain because of his hernias, I told him that he should have called [place] and report. I told him that this is the last time that we are accepting this, if this happens one more time, we will d/c his narcotics.   Refilled his MS-Contin and Hydrocodone today.

## 2013-05-26 NOTE — Patient Instructions (Signed)
Try to stay as active as tolerated 

## 2013-06-01 ENCOUNTER — Ambulatory Visit (INDEPENDENT_AMBULATORY_CARE_PROVIDER_SITE_OTHER): Payer: Medicare Other | Admitting: Family Medicine

## 2013-06-01 ENCOUNTER — Encounter: Payer: Self-pay | Admitting: Family Medicine

## 2013-06-01 ENCOUNTER — Other Ambulatory Visit: Payer: Self-pay | Admitting: Family Medicine

## 2013-06-01 VITALS — BP 144/88 | HR 78 | Temp 99.4°F | Wt 142.5 lb

## 2013-06-01 DIAGNOSIS — N179 Acute kidney failure, unspecified: Secondary | ICD-10-CM

## 2013-06-01 LAB — RENAL FUNCTION PANEL
Calcium: 9.9 mg/dL (ref 8.4–10.5)
Chloride: 103 mEq/L (ref 96–112)
Phosphorus: 3.8 mg/dL (ref 2.3–4.6)
Potassium: 3.7 mEq/L (ref 3.5–5.3)
Sodium: 142 mEq/L (ref 135–145)

## 2013-06-01 MED ORDER — ATORVASTATIN CALCIUM 40 MG PO TABS: 40.0000 mg | ORAL_TABLET | Freq: Every day | ORAL | Status: DC

## 2013-06-01 NOTE — Assessment & Plan Note (Addendum)
Anticipate sxs due to acute renal failure - anticipate prerenal azotemia after recent diarrheal episode. Will repeat renal panel STAT to r/o lab error and decrease enalapril, decrease gabapentin to renal dosing, and stop metformin. If persistently elevated Cr - will recommend ER evaluation. Currently seems tired but nontoxic - will expedite eval.

## 2013-06-01 NOTE — Progress Notes (Signed)
  Subjective:    Patient ID: Bobby Mimes Sr., male    DOB: June 13, 1946, 67 y.o.   MRN: [id number]  HPI CC: ER f/u  Bobby Henry presents today as ER f/u where he presented was concerned he had had recurrent stroke due to slurred speech, trembling, and generalized weakness.  Records and w/u reviewed - EKG - sinus bradycardia.  Cr 5.45!  Discharged home after potassium repleted. Making urine, but feels more confused, hazy. Increase in watery diarrhea for last 3-4 weeks.  Unclear why. Denies NSAID use. No abd pain, nausea/vomiting, itching.  No fevers/chills, recent viral illnesses.  CT Head (brain) Wo Contrast 05/24/2013 *RADIOLOGY REPORT* Clinical Data: Stroke. Difficulty speaking. Weakness. CT HEAD WITHOUT CONTRAST Technique: Contiguous axial images were obtained from the base of the skull through the vertex without contrast Comparison: 07/21/2008 Findings: There is no evidence of intracranial hemorrhage, brain edema, or other signs of acute infarction. There is no evidence of intracranial mass lesion or mass effect. No abnormal extraaxial fluid collections are identified. There is no evidence of hydrocephalus, or other significant intracranial abnormality. No skull abnormality identified. IMPRESSION: Negative non-contrast head CT. Original Report Authenticated By: Myles Rosenthal, M.D.   Has run out of metoprolol - "ran out of money".  HLD - crestor too expensive - ran out for the last month. Lab Results  Component Value Date   CHOL 109 10/08/2011   HDL 30.50* 10/08/2011   LDLCALC 58 11/07/2010   LDLDIRECT 52.6 10/08/2011   TRIG 205.0* 10/08/2011   CHOLHDL 4 10/08/2011     Past Medical History  Diagnosis Date  . HTN (hypertension)   . HLD (hyperlipidemia)   . DMII (diabetes mellitus, type 2)   . GERD (gastroesophageal reflux disease)   . PUD (peptic ulcer disease)   . Tobacco abuse   . Hx-TIA (transient ischemic attack) 2009  . Chronic low back pain   . Psychosexual dysfunction with  inhibited sexual excitement   . Carotid stenosis     s/p CEA - Dr. Hart Rochester  . Osteoarthritis   . Solitary pulmonary nodule     solitary pulmonary nodule (enlarged 2011)-follow up - stable on 2012 CT Bobby Henry)  . Embolism and thrombosis of unspecified site   . Diverticulosis   . 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 Inguinal hernia   . Atherosclerosis of arteries 02/2013    severe stenosis of SMA, celiac, moderate bilateral common iliac stenosis  . Myocardial infarction     ?  . Stroke     tia hx  . 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 Constitutional:  Tired, nontoxic  HENT:  Head: Normocephalic and atraumatic.  Mouth/Throat: Oropharynx is clear and moist. No oropharyngeal exudate.  Cardiovascular: Normal rate, regular rhythm, normal heart sounds and intact distal pulses.   No murmur heard. Pulmonary/Chest: Effort normal and breath sounds normal. No respiratory distress. He has no wheezes. He has no rales.  Abdominal: Soft. Bowel sounds are normal. He exhibits no distension and no mass. There is tenderness (mild RUQ). There is no rebound and no guarding.  Musculoskeletal: He exhibits no edema.  Skin: Skin is warm and dry.       Assessment & Plan:

## 2013-06-01 NOTE — Patient Instructions (Addendum)
Kidneys aren't doing well - stat blood work today. Hold metformin, decrease gabapentin to 1 pill nightly, and cut enalapril hctz in half. If kidney function staying elevated - I will call you to go to ER.  [phone] home). Increase water intake. Once feeling better, will change crestor 10mg  to lipitor 40mg  daily - cheaper alternative (generic).

## 2013-06-04 ENCOUNTER — Other Ambulatory Visit (INDEPENDENT_AMBULATORY_CARE_PROVIDER_SITE_OTHER): Payer: Medicare Other

## 2013-06-04 DIAGNOSIS — N179 Acute kidney failure, unspecified: Secondary | ICD-10-CM

## 2013-06-04 LAB — RENAL FUNCTION PANEL
BUN: 34 mg/dL — ABNORMAL HIGH (ref 6–23)
CO2: 27 mEq/L (ref 19–32)
Chloride: 98 mEq/L (ref 96–112)
GFR: 32.74 mL/min — ABNORMAL LOW (ref >60.00)
Phosphorus: 4.2 mg/dL (ref 2.3–4.6)
Potassium: 3.4 mEq/L — ABNORMAL LOW (ref 3.5–5.1)
Sodium: 136 mEq/L (ref 135–145)

## 2013-06-11 ENCOUNTER — Ambulatory Visit (INDEPENDENT_AMBULATORY_CARE_PROVIDER_SITE_OTHER): Payer: Medicare Other | Admitting: Family Medicine

## 2013-06-11 ENCOUNTER — Encounter: Payer: Self-pay | Admitting: Family Medicine

## 2013-06-11 VITALS — BP 124/78 | HR 60 | Temp 98.5°F | Wt 138.2 lb

## 2013-06-11 DIAGNOSIS — N179 Acute kidney failure, unspecified: Secondary | ICD-10-CM

## 2013-06-11 DIAGNOSIS — E119 Type 2 diabetes mellitus without complications: Secondary | ICD-10-CM

## 2013-06-11 DIAGNOSIS — I10 Essential (primary) hypertension: Secondary | ICD-10-CM

## 2013-06-11 NOTE — Assessment & Plan Note (Signed)
Advised to closely monitor bp while we decrease enalapril /hctz dose.

## 2013-06-11 NOTE — Assessment & Plan Note (Signed)
Feeling much better. Thought prerenal after acute episode of diarrheal illness. Slowly improving.  Recheck today. Continue to hold metformin and renally dose gabapentin. I again have asked him to only take 1 pill of enalapril. Pt agrees. rtc 2 mo for f/u, and recheck kidney function at that time.

## 2013-06-11 NOTE — Patient Instructions (Addendum)
Good to see you today. Blood work today to recheck kidneys. Continue to hold metformin and only take 300mg  gabapentin, and only take 1 pill of enalapril until kidneys improve. Keep blood pressure check at home and let me know if consistently >140/90. Return in 2 months for follow up. Show pain clinic you right lower back - if persistent , I will want to obtain xray of lower back.

## 2013-06-11 NOTE — Progress Notes (Signed)
  Subjective:    Patient ID: Bobby Mimes Sr., male    DOB: [birth year], 67 y.o.   MRN: [id number]  HPI CC: f/u ARF  Recent ARF thought due to prerenal azotemia after diarrheal episode.  States wife is at home with company - missed today's f/u appt.  Pt overall feeling better. Drinking plenty of water. Did not back down on enalapril as directed but did cut down on gabapentin to 300mg  daily and endorses holding metformin.  Making urine well.  No abd pain, nausea/vomiting, fevers/chills. Diarrhea has stopped for last 1-2 weeks.  Past Medical History  Diagnosis Date  . HTN (hypertension)   . HLD (hyperlipidemia)   . DMII (diabetes mellitus, type 2)   . GERD (gastroesophageal reflux disease)   . PUD (peptic ulcer disease)   . Tobacco abuse   . Hx-TIA (transient ischemic attack) 2009  . Chronic low back pain   . Psychosexual dysfunction with inhibited sexual excitement   . Carotid stenosis     s/p CEA - Dr. Hart Rochester  . Osteoarthritis   . Solitary pulmonary nodule     solitary pulmonary nodule (enlarged 2011)-follow up - stable on 2012 CT Delford Field)  . Embolism and thrombosis of unspecified site   . Diverticulosis   . 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 Inguinal hernia   . Atherosclerosis of arteries 02/2013    severe stenosis of SMA, celiac, moderate bilateral common iliac stenosis  . Myocardial infarction     ?  . Stroke     tia hx  . 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 Constitutional: He appears well-developed and well-nourished. No distress.  HENT:  Head: Normocephalic and atraumatic.  Mouth/Throat: Oropharynx is clear and moist. No oropharyngeal exudate.  Cardiovascular: Normal rate, regular rhythm, normal heart sounds and intact  distal pulses.   No murmur heard. Pulmonary/Chest: Effort normal and breath sounds normal. No respiratory distress. He has no wheezes. He has no rales.  Musculoskeletal: He exhibits no edema.  Persistent swelling right lower back at inferior border of ribcage  Skin: Skin is warm and dry.       Assessment & Plan:

## 2013-06-11 NOTE — Addendum Note (Signed)
Addended by: Eustaquio Boyden on: 06/11/2013 03:33 PM   Modules accepted: Level of Service

## 2013-06-11 NOTE — Assessment & Plan Note (Signed)
Advised to closely monitor sugars while holding metformin (has not checked in the last 10 days).

## 2013-06-12 LAB — BASIC METABOLIC PANEL
CO2: 28 mEq/L (ref 19–32)
Chloride: 101 mEq/L (ref 96–112)
Creatinine, Ser: 1.8 mg/dL — ABNORMAL HIGH (ref 0.4–1.5)
Sodium: 140 mEq/L (ref 135–145)

## 2013-06-25 ENCOUNTER — Encounter: Payer: Medicare Other | Admitting: Physical Medicine and Rehabilitation

## 2013-07-02 ENCOUNTER — Other Ambulatory Visit: Payer: Self-pay

## 2013-07-07 ENCOUNTER — Other Ambulatory Visit: Payer: Self-pay | Admitting: Family Medicine

## 2013-07-08 ENCOUNTER — Encounter: Payer: Self-pay | Admitting: Physical Medicine and Rehabilitation

## 2013-07-08 ENCOUNTER — Encounter
Payer: Medicare Other | Attending: Physical Medicine and Rehabilitation | Admitting: Physical Medicine and Rehabilitation

## 2013-07-08 ENCOUNTER — Telehealth: Payer: Self-pay | Admitting: *Deleted

## 2013-07-08 VITALS — BP 154/75 | HR 66 | Resp 14 | Ht 67.0 in | Wt 141.0 lb

## 2013-07-08 DIAGNOSIS — Z86718 Personal history of other venous thrombosis and embolism: Secondary | ICD-10-CM | POA: Insufficient documentation

## 2013-07-08 DIAGNOSIS — M1710 Unilateral primary osteoarthritis, unspecified knee: Secondary | ICD-10-CM | POA: Insufficient documentation

## 2013-07-08 DIAGNOSIS — G8929 Other chronic pain: Secondary | ICD-10-CM | POA: Insufficient documentation

## 2013-07-08 DIAGNOSIS — E119 Type 2 diabetes mellitus without complications: Secondary | ICD-10-CM | POA: Insufficient documentation

## 2013-07-08 DIAGNOSIS — K4090 Unilateral inguinal hernia, without obstruction or gangrene, not specified as recurrent: Secondary | ICD-10-CM | POA: Insufficient documentation

## 2013-07-08 DIAGNOSIS — E785 Hyperlipidemia, unspecified: Secondary | ICD-10-CM | POA: Insufficient documentation

## 2013-07-08 DIAGNOSIS — K219 Gastro-esophageal reflux disease without esophagitis: Secondary | ICD-10-CM | POA: Insufficient documentation

## 2013-07-08 DIAGNOSIS — M7989 Other specified soft tissue disorders: Secondary | ICD-10-CM | POA: Insufficient documentation

## 2013-07-08 DIAGNOSIS — M545 Low back pain, unspecified: Secondary | ICD-10-CM | POA: Insufficient documentation

## 2013-07-08 DIAGNOSIS — M25569 Pain in unspecified knee: Secondary | ICD-10-CM | POA: Insufficient documentation

## 2013-07-08 DIAGNOSIS — R209 Unspecified disturbances of skin sensation: Secondary | ICD-10-CM | POA: Insufficient documentation

## 2013-07-08 DIAGNOSIS — I10 Essential (primary) hypertension: Secondary | ICD-10-CM | POA: Insufficient documentation

## 2013-07-08 DIAGNOSIS — Z8673 Personal history of transient ischemic attack (TIA), and cerebral infarction without residual deficits: Secondary | ICD-10-CM | POA: Insufficient documentation

## 2013-07-08 DIAGNOSIS — M961 Postlaminectomy syndrome, not elsewhere classified: Secondary | ICD-10-CM | POA: Insufficient documentation

## 2013-07-08 DIAGNOSIS — M47817 Spondylosis without myelopathy or radiculopathy, lumbosacral region: Secondary | ICD-10-CM | POA: Insufficient documentation

## 2013-07-08 MED ORDER — HYDROCODONE-ACETAMINOPHEN 7.5-300 MG PO TABS: 1.0000 | ORAL_TABLET | Freq: Four times a day (QID) | ORAL | Status: DC | PRN

## 2013-07-08 MED ORDER — MORPHINE SULFATE ER 30 MG PO TBCR: 30.0000 mg | EXTENDED_RELEASE_TABLET | Freq: Every day | ORAL | Status: DC

## 2013-07-08 NOTE — Patient Instructions (Addendum)
Please call your PCP for a possible Doppler Ultrasound to evaluate the swelling of your left lower leg. Try to keep the compression sock on, also elevate your leg. If you notice increase in temperature, redness, increased pain or increased swelling go to urgent care or the ED asap.

## 2013-07-08 NOTE — Progress Notes (Signed)
Subjective:    Patient ID: Bobby Mimes Sr., male    DOB: 11/16/1946, 67 y.o.   MRN: [id number]  HPI The patient complains about chronic low back pain which radiates into the left lateral thigh to the knee. The patient also complains about numbness and tingling in the same area.the patient also complains about right knee pain.  The problem has been stable . The patient states, that he fell on the ice , and stretched his adductors a little, he also hurt his left wrist.  X-ray of his left wrist, did not show any acute findings, but he had an old scaphoid fracture. He was also diagnosed with a inguinal hernia, bilateral. He had surgery on 03/16/2013. The patient reports, that the surgery went well, the patient was already seen for a follow up, the surgeon was very pleased with the recovery, and released the patient without restrictions/ per patient.  He has tricompartmental osteoarthritis of the right knee.  He has had 2 previous lumbar spine operations and a cervical spine  operation. The last lumbar surgery was back in 2005 by Dr. Danielle Dess, L2-3  Diskectomy. The patient has seen Dr. Enid Baas PA, who recommended a right knee replacement, the patient's wife had lumbar surgery, she also has severe COPD,he does not feel that he can leave her alone, and the patient has not followed up on it yet.  Pain Inventory Average Pain 8 Pain Right Now 8 My pain is constant, sharp and aching  In the last 24 hours, has pain interfered with the following? General activity 7 Relation with others 7 Enjoyment of life 7 What TIME of day is your pain at its worst? day and night Sleep (in general) Poor  Pain is worse with: walking, bending, sitting, inactivity and standing Pain improves with: medication Relief from Meds: 2  Mobility use a cane how many minutes can you walk? 5 ability to climb steps?  no do you drive?  yes Do you have any goals in this area?  yes  Function disabled: date disabled . Do  you have any goals in this area?  yes  Neuro/Psych trouble walking  Prior Studies Any changes since last visit?  no  Physicians involved in your care Any changes since last visit?  no   Family History  Problem Relation Age of Onset  . Heart attack Father     CABG 6v  . Arthritis Father   . Alzheimer's disease Mother   . Arthritis Mother   . Bipolar disorder Sister   . Bipolar disorder Sister   . Cancer Neg Hx   . Diabetes Neg Hx   . Stroke Neg Hx    History   Social History  . Marital Status: Married    Spouse Name: N/A    Number of Children: N/A  . Years of Education: N/A   Occupational History  . Retired/disabled    Social History Main Topics  . Smoking status: Current Every Day Smoker -- 0.50 packs/day for 33 years    Types: Cigarettes  . Smokeless tobacco: Never Used  . Alcohol Use: No  . Drug Use: No  . Sexually Active: None   Other Topics Concern  . 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 Nose surgery  1982  . Neck  surgery  1984  . Knee surgery Bilateral 1997    menisectomy  . Carotid endarterectomy Right 5/09    with dacron patch angioplasty with resection of redundant carotid artery with primary reanastomosis  . Esophagogastroduodenoscopy  2007    deformed pylorus; gastric ulcer  . Gastrojejunostomy  4/09    laparoscopic; and high selective anterior and posterior vagotomies  . Back surgery    . Cervical spine surgery    . Colonoscopy  2008    diverticulosis (Dr. Bosie Clos) repeat 2018  . Cardiac catheterization  13  . Inguinal hernia repair Right 03/17/2013    Procedure: HERNIA REPAIR INGUINAL ADULT;  Surgeon: [name], DO;  Location: [facility] OR;  Service: General;  Laterality: Right;  . Insertion of mesh Right 03/17/2013    Procedure: INSERTION OF MESH ;  Surgeon: [name], DO;  Location: [facility] OR;  Service: General;  Laterality: Right;   Past Medical History  Diagnosis Date  . HTN (hypertension)   . HLD (hyperlipidemia)   . DMII (diabetes mellitus, type 2)   . GERD (gastroesophageal reflux disease)   . PUD (peptic ulcer disease)   . Tobacco abuse   . Hx-TIA (transient ischemic attack) 2009  . Chronic low back pain   . Psychosexual dysfunction with inhibited sexual excitement   . Carotid stenosis     s/p CEA - Dr. Hart Rochester  . Osteoarthritis   . Solitary pulmonary nodule     solitary pulmonary nodule (enlarged 2011)-follow up - stable on 2012 CT Delford Field)  . Embolism and thrombosis of unspecified site   . Diverticulosis   . 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 Inguinal hernia   . Atherosclerosis of arteries 02/2013    severe stenosis of SMA, celiac, moderate bilateral common iliac stenosis  . Myocardial infarction     ?  . Stroke     tia hx  . Left wrist fracture 01/2013    evidence of old scaphoid fracture with scapholunate advanced collapse (SLAC) - declined ortho referral   BP 154/75  Pulse 66  Resp 14  Ht 5\' 7"  (1.702 m)  Wt 141 lb (63.957 kg)  BMI 22.08 kg/m2  SpO2 97%     Review of Systems  Musculoskeletal: Positive for back pain and gait problem.  All other systems reviewed and are negative.       Objective:   Physical Exam Constitutional: He is oriented to person, place, and time. He appears well-developed and well-nourished.  HENT:  Head: Normocephalic.  Neck: Neck supple.  Musculoskeletal: He exhibits tenderness.  Neurological: He is alert and oriented to person, place, and time.  Skin: Skin is warm and dry. Mildly swollen left lower leg, no redness or increased temperature noted, but mildly tender Psychiatric: He has a normal mood and affect.  Symmetric normal motor tone is noted throughout. Normal muscle bulk, except atrophy in thigh muscles bilateral. Muscle testing  reveals 5/5 muscle strength of the upper extremity, and 5/5 of the lower extremity. Full range of motion in upper and lower extremities, except decrease ROM in right knee, extension deficit of 20 degrees. ROM of spine is restricted. Fine motor movements are normal in both hands.  Sensory is intact and symmetric to light touch, pinprick and proprioception.  DTR in the upper and lower extremity are present and symmetric 2+. No clonus is noted.  Patient arises from chair without difficulty. Wide based gait with a limp and with  a cane, kyphotic posture because of hernia pain. Able to stand on heels and toes . Tandem walk is possible with help, but unstable. No pronator drift. Rhomberg negative        Assessment & Plan:  1. Lumbar spondylosisunderwent facet block 03/10/2012 [facility]  2. Lumbar postlaminectomy syndrome  3. Tricompartmental right knee osteoarthritis./Right knee pain.Patient has seen Dr. Enid Baas PA, who recommended a TKR .  4. Inguinal hernia, bilateral, surgery scheduled on 03/16/2013, surgery went well, the patient was already seen for a follow up, the surgeon was very pleased with the recovery, and released the patient without restrictions/ per patient. 5. Mildly swollen left lower leg, no redness or increase in temperature, mildly tender on palpation. Advised patient to call his PCP to evaluate, and if necessary order a Doppler. Also advised patient if symptoms increase or he develops redness and increase in temperature he should go to the ED asap.  He continues to use a cane for ambulation.  The patient's last UDS was negative for opioids, he states, that he ran out off his medication 2 days early, also the test was evaluated one day after he gave his sample, at the last visit. I educated the patient, that he should take the medication as prescribed, and if this happens again, we would not prescribe narcotics any more. The patient understood and agreed.Patient was out of his  medication early again, I educated him again, that he should take his medication as prescribed. He stated, that he has severe pain because of his hernias, I told him that he should have called [place] and report. I told him that this is the last time that we are accepting this, if this happens one more time, we will d/c his narcotics.

## 2013-07-08 NOTE — Telephone Encounter (Signed)
FYI- Patient's wife called the front desk stating that he went to his pain management appt today and they think he may have a clot in his arm and suggested he be evaluated right away. The front desk asked what he should do since there were no appts left for today. I advised for him to go to the ER for eval if he had already seen a doctor that was suspicious for clot. They advised patient's wife of same.

## 2013-07-09 ENCOUNTER — Ambulatory Visit (INDEPENDENT_AMBULATORY_CARE_PROVIDER_SITE_OTHER): Payer: Medicare Other | Admitting: Family Medicine

## 2013-07-09 ENCOUNTER — Telehealth: Payer: Self-pay | Admitting: *Deleted

## 2013-07-09 ENCOUNTER — Encounter (INDEPENDENT_AMBULATORY_CARE_PROVIDER_SITE_OTHER): Payer: Medicare Other

## 2013-07-09 ENCOUNTER — Encounter: Payer: Self-pay | Admitting: Family Medicine

## 2013-07-09 VITALS — BP 100/60 | HR 64 | Temp 98.3°F | Wt 140.8 lb

## 2013-07-09 DIAGNOSIS — M7989 Other specified soft tissue disorders: Secondary | ICD-10-CM

## 2013-07-09 DIAGNOSIS — M79609 Pain in unspecified limb: Secondary | ICD-10-CM

## 2013-07-09 NOTE — Telephone Encounter (Signed)
Sure, no problem. 

## 2013-07-09 NOTE — Progress Notes (Signed)
  Subjective:    Patient ID: Bobby Mimes Sr., male    DOB: 03-15-1946, 67 y.o.   MRN: [id number]  HPI CC: left leg swelling  2d h/o left leg swelling from knee down.  Increased pain compared to normal.  Increased swelling, warmth at leg. Seen yesterday by pain clinic PA, recommended eval by PCP for L leg swelling.  Denies dyspnea, cough, chest pain. Denies leg injury.   Smoker 1/4 ppd. Did recently go to [place] last week (2 hour car ride)  Past Medical History  Diagnosis Date  . HTN (hypertension)   . HLD (hyperlipidemia)   . DMII (diabetes mellitus, type 2)   . GERD (gastroesophageal reflux disease)   . PUD (peptic ulcer disease)   . Tobacco abuse   . Hx-TIA (transient ischemic attack) 2009  . Chronic low back pain   . Psychosexual dysfunction with inhibited sexual excitement   . Carotid stenosis     s/p CEA - Dr. Hart Rochester  . Osteoarthritis   . Solitary pulmonary nodule     solitary pulmonary nodule (enlarged 2011)-follow up - stable on 2012 CT [facility])  . Embolism and thrombosis of unspecified site   . Diverticulosis   . 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 Inguinal hernia   . Atherosclerosis of arteries 02/2013    severe stenosis of SMA, celiac, moderate bilateral common iliac stenosis  . Myocardial infarction     ?  . Stroke     tia hx  . 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 Constitutional: He appears well-developed and well-nourished. No distress.  Musculoskeletal: He exhibits edema (nonpitting of L LEG).  Erythema, warmth, swelling , and pain of L post calf to popliteal region compared to right. No palpable cords  L calf circ 36cm R calf circ 33cm       Assessment & Plan:

## 2013-07-09 NOTE — Assessment & Plan Note (Addendum)
Not consistent with infection. In setting of recent long car ride, will need stat LLE doppler to r/o DVT discussed with patient superficial thrombophlebitis vs DVT Encouraged continued compression and elevation.

## 2013-07-09 NOTE — Telephone Encounter (Signed)
[facility] wants to know if  He can have his hydrocodone changed to 7.5 325 instead of 7.5/300.  It is much cheaper.

## 2013-07-09 NOTE — Telephone Encounter (Signed)
Spoke with patient. It was his leg-not his arm. He didn't go to ER yesterday. Appt made with you today. He said you have already evaluated his leg for a possible clot before. It is the same leg,same place. Swollen and tender.

## 2013-07-09 NOTE — Telephone Encounter (Signed)
Can we call for f/u?  If has not been evaluated, please schedule in office today.

## 2013-07-09 NOTE — Patient Instructions (Signed)
Pass by up front to set up ultrasound of left leg today. We will call you with results and plan [phone] (H) [phone] (M)

## 2013-07-10 ENCOUNTER — Telehealth: Payer: Self-pay

## 2013-07-10 MED ORDER — HYDROCODONE-ACETAMINOPHEN 7.5-325 MG PO TABS: 1.0000 | ORAL_TABLET | Freq: Four times a day (QID) | ORAL | Status: DC | PRN

## 2013-07-10 NOTE — Telephone Encounter (Signed)
[facility] called to see if they could change medication to 7.5/325.  This was done per other encounter.

## 2013-07-10 NOTE — Telephone Encounter (Signed)
Rx changed for hydrocodone 7.5 with increased acetaminophen to 325 due to it being cheaper than the 7.5/300.  Order was also corrected to dispense # 120 tablets ( one  4 times daily)  Instead of previously ordered #180.  Spoke with pharmacist to make sure corrections made.

## 2013-07-16 ENCOUNTER — Telehealth: Payer: Self-pay

## 2013-07-16 NOTE — Telephone Encounter (Signed)
Left message for patient to call office regarding urine drug screen. 

## 2013-07-16 NOTE — Telephone Encounter (Signed)
Message copied by Judd Gaudier on Thu Jul 16, 2013  3:35 PM ------      Message from: Su Monks      Created: Thu Jul 16, 2013  9:34 AM       D/C patient, that is the 2-3rd inconsistent test, he violated his contract.      He was seen 07/16, he can taper down his hydrocodone 1 tablet per day per week, we have to give him a one week supply of MS Contin 15mg , he is on 30mg , so he should take those until empty then he should take the 15 mg for one week .       You can also provide him with other pain clinics. ------

## 2013-08-01 ENCOUNTER — Other Ambulatory Visit: Payer: Self-pay | Admitting: Physical Medicine and Rehabilitation

## 2013-08-07 ENCOUNTER — Telehealth: Payer: Self-pay

## 2013-08-07 MED ORDER — HYDROCODONE-ACETAMINOPHEN 7.5-325 MG PO TABS: ORAL_TABLET | ORAL | Status: DC

## 2013-08-07 NOTE — Telephone Encounter (Signed)
I spoke with Mr Bobby Henry to inform him that we are discharging him from the clinic due to his inconsistent UDS.  We left a message with his voicemail on 07/16/13 for him to return our call which he did not do.  He says that they were out of town at that time.  I told him that his UDS was negative for the hydrocodone and morphine but was positive for oxycodone.  He denies taking oxycodone.  I told him that we were going to do a taper dose for him but that was based on 07/16/13 and I would have to speak with [name] our PA and call him back.  I called back to relay the information from [name] documented (I called in the hydrocodone 7.5/325 taper.  She intended to say 7.5 hydrocodone instead of the 10 mg.  [name] answered the phone and said that [name] left shortly after talking with me , upset and she did no know where he went or had not heard from him.  She identified herself as his wife and he has documented that we may share information with her. She asked what was going on.  I explained to her why we had called and that we were discharging him, but that we would give him a wean down dose for 3 weeks and send him a list of area pain clinics to find a new physician.  I told her I called in the hydrocodone but the morphine could not be called in and it was 4:15 on a Friday and we close at 4:30.  I told her we would have the morphine rx available but it would have to be picked up. I told her that we would remove the contract from his record so that he could seek medication from his PCP or Urgent care until he finds a new pain clinic.  She was very concerned about him leaving and not knowing where he went but was grateful that we were providing him the list with the clinics and a taper dose.  A pamphlet for the [facility] will be sent as well    All three of his UDS have never demonstrated presence of hydrocodone or morphine, only oxycodone which we are not providing, so if he does not receive the morphine rx it  should not be needed.

## 2013-08-07 NOTE — Telephone Encounter (Signed)
Patient needs a refill on hydrocodone. 

## 2013-08-07 NOTE — Telephone Encounter (Signed)
D/C patient, that is the 2-3rd inconsistent test, he violated his contract.  He was seen 07/16, we will give him a  taper down dose, he should take  hydrocodone 1 tablet per day per week less, 1st week hydrocodone 10 mg, tid 2nd week bid,  3rd week 1 tablet per day  =   # 42  we have to give him a one week supply of MS Contin 15mg , he is 30mg  now, he should take the 15 mg for one week .  You can also provide him with other pain clinics.

## 2013-08-10 ENCOUNTER — Other Ambulatory Visit: Payer: Self-pay | Admitting: *Deleted

## 2013-08-10 MED ORDER — MORPHINE SULFATE ER 15 MG PO TBCR: 15.0000 mg | EXTENDED_RELEASE_TABLET | Freq: Every day | ORAL | Status: DC

## 2013-08-10 NOTE — Telephone Encounter (Signed)
rx printed for DR Kirsteins to sign for wean dose of MS Contin.  Letter given to him when getting rx.

## 2013-08-10 NOTE — Telephone Encounter (Signed)
Discharge letter sent and included list of pain clinics and a  Pamphlet for [facility] included.

## 2013-08-11 ENCOUNTER — Ambulatory Visit (INDEPENDENT_AMBULATORY_CARE_PROVIDER_SITE_OTHER): Payer: Medicare Other | Admitting: Family Medicine

## 2013-08-11 ENCOUNTER — Encounter: Payer: Self-pay | Admitting: Family Medicine

## 2013-08-11 VITALS — BP 110/70 | HR 60 | Temp 98.4°F | Wt 140.2 lb

## 2013-08-11 DIAGNOSIS — E119 Type 2 diabetes mellitus without complications: Secondary | ICD-10-CM

## 2013-08-11 DIAGNOSIS — M1A00X1 Idiopathic chronic gout, unspecified site, with tophus (tophi): Secondary | ICD-10-CM

## 2013-08-11 DIAGNOSIS — I10 Essential (primary) hypertension: Secondary | ICD-10-CM

## 2013-08-11 DIAGNOSIS — M1A9XX1 Chronic gout, unspecified, with tophus (tophi): Secondary | ICD-10-CM

## 2013-08-11 DIAGNOSIS — N179 Acute kidney failure, unspecified: Secondary | ICD-10-CM

## 2013-08-11 DIAGNOSIS — M961 Postlaminectomy syndrome, not elsewhere classified: Secondary | ICD-10-CM

## 2013-08-11 NOTE — Patient Instructions (Addendum)
Return to see me in 3 months for follow up. Blood work today. Good to see you today, call us with questions. Let's continue to hold metformin until we get results of blood work.

## 2013-08-11 NOTE — Progress Notes (Signed)
  Subjective:    Patient ID: Bobby Mimes Sr., male    DOB: 1946-04-14, 67 y.o.   MRN: [id number]  HPI CC: 2 mo f/u  H/o ARF from dehydration.  Staying well hydrated with water.   Discussed other drinks. HTN - No HA, vision changes, CP/tightness, SOB, leg swelling. Compliant with meds (amlodipine 10mg , enalapril hctz 10/25, metoprolol 75mg  bid) DM - not holding metformin.  Has not recently seen eye doctor.  Foot exam today.  Endorses sugars running well controlled. Lab Results  Component Value Date   HGBA1C 7.1* 10/08/2011    Lab Results  Component Value Date   CREATININE 1.8* 06/11/2013   BP Readings from Last 3 Encounters:  08/11/13 110/70  07/09/13 100/60  07/08/13 154/75   Chronic pain - has been released from pain clinic 2/2 running out of narcotics and taking friend's med.  Decided to attempt wean off hydrocodone rather than re establish with another pain clinic.  Slowly coming off pain meds (on ms contin 15mg  bid and hydrocodone 7.5/325mg  taper)  Past Medical History  Diagnosis Date  . HTN (hypertension)   . HLD (hyperlipidemia)   . DMII (diabetes mellitus, type 2)   . GERD (gastroesophageal reflux disease)   . PUD (peptic ulcer disease)   . Tobacco abuse   . Hx-TIA (transient ischemic attack) 2009  . Chronic low back pain   . Psychosexual dysfunction with inhibited sexual excitement   . Carotid stenosis     s/p CEA - Dr. Hart Rochester  . Osteoarthritis   . Solitary pulmonary nodule     solitary pulmonary nodule (enlarged 2011)-follow up - stable on 2012 CT [facility])  . Embolism and thrombosis of unspecified site   . Diverticulosis   . 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 Inguinal hernia   . Atherosclerosis of arteries 02/2013    severe stenosis of SMA, celiac, moderate bilateral common iliac stenosis  . Myocardial infarction     ?  . Stroke     tia hx  . Left wrist fracture 01/2013    evidence of old scaphoid fracture  with scapholunate advanced collapse (SLAC) - declined ortho referral     Review of Systems Per HPI    Objective:   Physical Exam  Nursing note and vitals reviewed. Constitutional: He appears well-developed and well-nourished. No distress.  HENT:  Head: Normocephalic and atraumatic.  Mouth/Throat: Oropharynx is clear and moist. No oropharyngeal exudate.  Eyes: Conjunctivae and EOM are normal. Pupils are equal, round, and reactive to light. No scleral icterus.  Neck: Normal range of motion. Neck supple.  Cardiovascular: Normal rate, regular rhythm, normal heart sounds and intact distal pulses.   No murmur heard. Pulmonary/Chest: Effort normal and breath sounds normal. No respiratory distress. He has no wheezes. He has no rales.  Musculoskeletal: He exhibits no edema.  Diabetic foot exam: Normal inspection No skin breakdown L medial foot with calluses  Mildly diminished DP/PT pulses Normal sensation to light touch and monofilament Nails thickened and elongated  Lymphadenopathy:    He has no cervical adenopathy.  Skin: Skin is warm and dry. No rash noted.  Psychiatric: He has a normal mood and affect.       Assessment & Plan:

## 2013-08-12 ENCOUNTER — Encounter: Payer: Self-pay | Admitting: Family Medicine

## 2013-08-12 LAB — MICROALBUMIN / CREATININE URINE RATIO
Creatinine,U: 56.2 mg/dL
Microalb Creat Ratio: 0.5 mg/g (ref 0.0–30.0)
Microalb, Ur: 0.3 mg/dL (ref 0.0–1.9)

## 2013-08-12 LAB — RENAL FUNCTION PANEL
Chloride: 99 mEq/L (ref 96–112)
GFR: 55.04 mL/min — ABNORMAL LOW (ref >60.00)
Glucose, Bld: 118 mg/dL — ABNORMAL HIGH (ref 70–99)
Phosphorus: 3.7 mg/dL (ref 2.3–4.6)
Potassium: 4 mEq/L (ref 3.5–5.1)
Sodium: 135 mEq/L (ref 135–145)

## 2013-08-12 NOTE — Assessment & Plan Note (Signed)
Recheck Cr today.  Anticipate continued improvement. Advised to hold metformin until we recheck Cr.

## 2013-08-12 NOTE — Assessment & Plan Note (Signed)
Trial of discontinuation of narcotics.

## 2013-08-12 NOTE — Assessment & Plan Note (Signed)
Good control on current regimen. Consider coming off HCTZ in h/o gout.

## 2013-08-12 NOTE — Assessment & Plan Note (Signed)
Check A1c today.  Has not been holding metformin.

## 2013-08-12 NOTE — Assessment & Plan Note (Signed)
May need to come off HCTZ in future.

## 2013-08-17 ENCOUNTER — Ambulatory Visit: Payer: Medicare Other | Admitting: Physical Medicine and Rehabilitation

## 2013-08-26 ENCOUNTER — Telehealth: Payer: Self-pay | Admitting: Family Medicine

## 2013-08-26 NOTE — Telephone Encounter (Signed)
Pt left vm stating that his insurance co told him to call our office.  Attempted to call pt back, no answer.  Left message for pt to return call.

## 2013-08-31 ENCOUNTER — Encounter: Payer: Self-pay | Admitting: Family

## 2013-09-01 ENCOUNTER — Ambulatory Visit: Payer: Medicare Other | Admitting: Family

## 2013-09-01 ENCOUNTER — Other Ambulatory Visit: Payer: Medicare Other

## 2013-09-03 ENCOUNTER — Other Ambulatory Visit: Payer: Self-pay | Admitting: *Deleted

## 2013-09-03 DIAGNOSIS — Z48812 Encounter for surgical aftercare following surgery on the circulatory system: Secondary | ICD-10-CM

## 2013-09-21 ENCOUNTER — Encounter: Payer: Self-pay | Admitting: Family

## 2013-09-22 ENCOUNTER — Ambulatory Visit (HOSPITAL_COMMUNITY)
Admission: RE | Admit: 2013-09-22 | Discharge: 2013-09-22 | Disposition: A | Discharge status: Home / Self Care | Payer: Medicare Other | Source: Ambulatory Visit | Attending: Vascular Surgery | Admitting: Vascular Surgery

## 2013-09-22 ENCOUNTER — Ambulatory Visit (INDEPENDENT_AMBULATORY_CARE_PROVIDER_SITE_OTHER): Payer: Medicare Other | Admitting: Family

## 2013-09-22 ENCOUNTER — Encounter: Payer: Self-pay | Admitting: Family

## 2013-09-22 DIAGNOSIS — Z48812 Encounter for surgical aftercare following surgery on the circulatory system: Secondary | ICD-10-CM

## 2013-09-22 DIAGNOSIS — I6529 Occlusion and stenosis of unspecified carotid artery: Secondary | ICD-10-CM

## 2013-09-22 NOTE — Patient Instructions (Addendum)
Stroke Prevention Some medical conditions and behaviors are associated with an increased chance of having a stroke. You may prevent a stroke by making healthy choices and managing medical conditions. Reduce your risk of having a stroke by:  Staying physically active. Get at least 30 minutes of activity on most or all days.  Not smoking. It may also be helpful to avoid exposure to secondhand smoke.  Limiting alcohol use. Moderate alcohol use is considered to be:  No more than 2 drinks per day for men.  No more than 1 drink per day for nonpregnant women.  Eating healthy foods.  Include 5 or more servings of fruits and vegetables a day.  Certain diets may be prescribed to address high blood pressure, high cholesterol, diabetes, or obesity.  Managing your cholesterol levels.  A low-saturated fat, low-trans fat, low-cholesterol, and high-fiber diet may control cholesterol levels.  Take any prescribed medicines to control cholesterol as directed by your caregiver.  Managing your diabetes.  A controlled-carbohydrate, controlled-sugar diet is recommended to manage diabetes.  Take any prescribed medicines to control diabetes as directed by your caregiver.  Controlling your high blood pressure (hypertension).  A low-salt (sodium), low-saturated fat, low-trans fat, and low-cholesterol diet is recommended to manage high blood pressure.  Take any prescribed medicines to control hypertension as directed by your caregiver.  Maintaining a healthy weight.  A reduced-calorie, low-sodium, low-saturated fat, low-trans fat, low-cholesterol diet is recommended to manage weight.  Stopping drug abuse.  Avoiding birth control pills.  Talk to your caregiver about the risks of taking birth control pills if you are over 35 years old, smoke, get migraines, or have ever had a blood clot.  Getting evaluated for sleep disorders (sleep apnea).  Talk to your caregiver about getting a sleep evaluation  if you snore a lot or have excessive sleepiness.  Taking medicines as directed by your caregiver.  For some people, aspirin or blood thinners (anticoagulants) are helpful in reducing the risk of forming abnormal blood clots that can lead to stroke. If you have the irregular heart rhythm of atrial fibrillation, you should be on a blood thinner unless there is a good reason you cannot take them.  Understand all your medicine instructions. SEEK IMMEDIATE MEDICAL CARE IF:   You have sudden weakness or numbness of the face, arm, or leg, especially on one side of the body.  You have sudden confusion.  You have trouble speaking (aphasia) or understanding.  You have sudden trouble seeing in one or both eyes.  You have sudden trouble walking.  You have dizziness.  You have a loss of balance or coordination.  You have a sudden, severe headache with no known cause.  You have new chest pain or an irregular heartbeat. Any of these symptoms may represent a serious problem that is an emergency. Do not wait to see if the symptoms will go away. Get medical help right away. Call your local emergency services (911 in [place] Do not drive yourself to the hospital. Document Released: 01/17/2005 Document Revised: 03/03/2012 Document Reviewed: 07/30/2011 [employer] Patient Information 2014 [employer]  Smoking Cessation Quitting smoking is important to your health and has many advantages. However, it is not always easy to quit since nicotine is a very addictive drug. Often times, people try 3 times or more before being able to quit. This document explains the best ways for you to prepare to quit smoking. Quitting takes hard work and a lot of effort, but you can do it.   ADVANTAGES OF QUITTING SMOKING You will live longer, feel better, and live better. Your body will feel the impact of quitting smoking almost immediately. Within 20 minutes, blood pressure decreases. Your pulse returns to its normal  level. After 8 hours, carbon monoxide levels in the blood return to normal. Your oxygen level increases. After 24 hours, the chance of having a heart attack starts to decrease. Your breath, hair, and body stop smelling like smoke. After 48 hours, damaged nerve endings begin to recover. Your sense of taste and smell improve. After 72 hours, the body is virtually free of nicotine. Your bronchial tubes relax and breathing becomes easier. After 2 to 12 weeks, lungs can hold more air. Exercise becomes easier and circulation improves. The risk of having a heart attack, stroke, cancer, or lung disease is greatly reduced. After 1 year, the risk of coronary heart disease is cut in half. After 5 years, the risk of stroke falls to the same as a nonsmoker. After 10 years, the risk of lung cancer is cut in half and the risk of other cancers decreases significantly. After 15 years, the risk of coronary heart disease drops, usually to the level of a nonsmoker. If you are pregnant, quitting smoking will improve your chances of having a healthy baby. The people you live with, especially any children, will be healthier. You will have extra money to spend on things other than cigarettes. QUESTIONS TO THINK ABOUT BEFORE ATTEMPTING TO QUIT You may want to talk about your answers with your caregiver. Why do you want to quit? If you tried to quit in the past, what helped and what did not? What will be the most difficult situations for you after you quit? How will you plan to handle them? Who can help you through the tough times? Your family? Friends? A caregiver? What pleasures do you get from smoking? What ways can you still get pleasure if you quit? Here are some questions to ask your caregiver: How can you help me to be successful at quitting? What medicine do you think would be best for me and how should I take it? What should I do if I need more help? What is smoking withdrawal like? How can I get information  on withdrawal? GET READY Set a quit date. Change your environment by getting rid of all cigarettes, ashtrays, matches, and lighters in your home, car, or work. Do not let people smoke in your home. Review your past attempts to quit. Think about what worked and what did not. GET SUPPORT AND ENCOURAGEMENT You have a better chance of being successful if you have help. You can get support in many ways. Tell your family, friends, and co-workers that you are going to quit and need their support. Ask them not to smoke around you. Get individual, group, or telephone counseling and support. Programs are available at local hospitals and health centers. Call your local health department for information about programs in your area. Spiritual beliefs and practices may help some smokers quit. Download a "quit meter" on your computer to keep track of quit statistics, such as how long you have gone without smoking, cigarettes not smoked, and money saved. Get a self-help book about quitting smoking and staying off of tobacco. LEARN NEW SKILLS AND BEHAVIORS Distract yourself from urges to smoke. Talk to someone, go for a walk, or occupy your time with a task. Change your normal routine. Take a different route to work. Drink tea instead of coffee. Eat breakfast   in a different place. Reduce your stress. Take a hot bath, exercise, or read a book. Plan something enjoyable to do every day. Reward yourself for not smoking. Explore interactive web-based programs that specialize in helping you quit. GET MEDICINE AND USE IT CORRECTLY Medicines can help you stop smoking and decrease the urge to smoke. Combining medicine with the above behavioral methods and support can greatly increase your chances of successfully quitting smoking. Nicotine replacement therapy helps deliver nicotine to your body without the negative effects and risks of smoking. Nicotine replacement therapy includes nicotine gum, lozenges, inhalers, nasal  sprays, and skin patches. Some may be available over-the-counter and others require a prescription. Antidepressant medicine helps people abstain from smoking, but how this works is unknown. This medicine is available by prescription. Nicotinic receptor partial agonist medicine simulates the effect of nicotine in your brain. This medicine is available by prescription. Ask your caregiver for advice about which medicines to use and how to use them based on your health history. Your caregiver will tell you what side effects to look out for if you choose to be on a medicine or therapy. Carefully read the information on the package. Do not use any other product containing nicotine while using a nicotine replacement product.  RELAPSE OR DIFFICULT SITUATIONS Most relapses occur within the first 3 months after quitting. Do not be discouraged if you start smoking again. Remember, most people try several times before finally quitting. You may have symptoms of withdrawal because your body is used to nicotine. You may crave cigarettes, be irritable, feel very hungry, cough often, get headaches, or have difficulty concentrating. The withdrawal symptoms are only temporary. They are strongest when you first quit, but they will go away within 10 14 days. To reduce the chances of relapse, try to: Avoid drinking alcohol. Drinking lowers your chances of successfully quitting. Reduce the amount of caffeine you consume. Once you quit smoking, the amount of caffeine in your body increases and can give you symptoms, such as a rapid heartbeat, sweating, and anxiety. Avoid smokers because they can make you want to smoke. Do not let weight gain distract you. Many smokers will gain weight when they quit, usually less than 10 pounds. Eat a healthy diet and stay active. You can always lose the weight gained after you quit. Find ways to improve your mood other than smoking. FOR MORE INFORMATION  www.smokefree.gov  Document Released:  12/04/2001 Document Revised: 06/10/2012 Document Reviewed: 03/20/2012 [employer] Patient Information 2014 [employer].  

## 2013-09-22 NOTE — Progress Notes (Signed)
[facility] [place] HISTORY AND PHYSICAL   MRN : [id number]  History of Present Illness:   Bobby Kingston. is a 67 y.o. male status post right CEA 07/28/2008 by Dr. Hart Rochester. Patient had a left brain TIA just prior to surgery. Patient denies food fear and denies recent weight loss; states his weight has remained 135-145# for the last 3-4 years. He has a history of coronary artery disease not requiring stenting nor CABG, but he was told that he had an MI about 9 years ago of which he was not aware. Patient denies dyspnea, denies chest pain, and he walks a lot.    Pt. denies claudication in legs.  Pt. denies rest pain; denies night pain denies non healing ulcers on lower extremities. The patient denies amaurosis fugax or monocular blindness.  The patient  denies facial drooping.  Pt. denies hemiplegia.  The patient denies receptive or expressive aphasia.    The patient has not had acute back or abdominal pain, but he does have chronic low back pain from degenerative disc disease. He cares for his wife who is a 3 PPD smoker, and his mother-in-law. Unemployed adult stepson also lives with him.  Pt Diabetic: Yes Pt smoker: smoker  (1/4 ppd x 20 yrs)  Current Outpatient Prescriptions  Medication Sig Dispense Refill  . amLODipine (NORVASC) 10 MG tablet TAKE 1 TABLET BY MOUTH EVERY DAY  30 tablet  11  . aspirin EC 81 MG tablet Take 81 mg by mouth daily.      [name] atorvastatin (LIPITOR) 20 MG tablet Take 20 mg by mouth daily.      . enalapril-hydrochlorothiazide (VASERETIC) 10-25 MG per tablet Take 1 tablet by mouth daily.       [name] gabapentin (NEURONTIN) 300 MG capsule TAKE 1 CAPSULE (300 MG TOTAL) BY MOUTH 4 (FOUR) TIMES DAILY.  120 capsule  3  . glipiZIDE (GLUCOTROL) 10 MG tablet Take 10 mg by mouth 2 (two) times daily before a meal.      . metFORMIN (GLUCOPHAGE-XR) 750 MG 24 hr tablet Take 750 mg by mouth daily with breakfast.      . metoprolol (LOPRESSOR) 50 MG tablet  Take 75 mg by mouth 2 (two) times daily.      [name] omeprazole (PRILOSEC OTC) 20 MG tablet Take 20 mg by mouth daily.       [name] HYDROcodone-acetaminophen (NORCO) 7.5-325 MG per tablet Take 1 tid for one week, then 1 bid for one week, then 1 q day for one week and stop.  42 tablet  0  . morphine (MS CONTIN) 15 MG 12 hr tablet Take 1 tablet (15 mg total) by mouth daily. For one week then stop  7 tablet  0   No current facility-administered medications for this visit.    Pt meds include: Statin :Yes Betablocker: Yes ASA: Yes Other anticoagulants/antiplatelets: no  Past Medical History  Diagnosis Date  . HTN (hypertension)   . HLD (hyperlipidemia)   . DMII (diabetes mellitus, type 2)   . GERD (gastroesophageal reflux disease)   . PUD (peptic ulcer disease)   . Tobacco abuse   . Hx-TIA (transient ischemic attack) 2009  . Chronic low back pain   . Psychosexual dysfunction with inhibited sexual excitement   . Carotid stenosis     s/p CEA - Dr. Hart Rochester  . Osteoarthritis   . Solitary pulmonary nodule     solitary pulmonary nodule (enlarged 2011)-follow up - stable on 2012 CT (  Delford Field)  . Embolism and thrombosis of unspecified site   . Diverticulosis   . 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 Inguinal hernia   . Atherosclerosis of arteries 02/2013    severe stenosis of SMA, celiac, moderate bilateral common iliac stenosis  . Myocardial infarction     ?  . Stroke     tia hx  . Left wrist fracture 01/2013    evidence of old scaphoid fracture with scapholunate advanced collapse Marry Guan) - declined ortho referral    Past Surgical History  Procedure Laterality Date  . Nose surgery  1982  . Neck surgery  1984  . Knee surgery Bilateral 1997    menisectomy  . Carotid endarterectomy Right 5/09    with dacron patch angioplasty with resection of redundant carotid artery with primary reanastomosis  . Esophagogastroduodenoscopy  2007    deformed pylorus;  gastric ulcer  . Gastrojejunostomy  4/09    laparoscopic; and high selective anterior and posterior vagotomies  . Back surgery    . Cervical spine surgery    . Colonoscopy  2008    diverticulosis (Dr. Bosie Clos) repeat 2018  . Cardiac catheterization  13  . Inguinal hernia repair Right 03/17/2013    Procedure: HERNIA REPAIR INGUINAL ADULT;  Surgeon: [name], DO;  Location: [facility] OR;  Service: General;  Laterality: Right;  . Insertion of mesh Right 03/17/2013    Procedure: INSERTION OF MESH ;  Surgeon: [name], DO;  Location: [facility] OR;  Service: General;  Laterality: Right;    Social History History  Substance Use Topics  . Smoking status: Current Every Day Smoker -- 0.25 packs/day for 33 years    Types: Cigarettes  . Smokeless tobacco: Never Used  . Alcohol Use: No    Family History Family History  Problem Relation Age of Onset  . Heart attack Father     CABG 6v  . Arthritis Father   . Alzheimer's disease Mother   . Arthritis Mother   . Bipolar disorder Sister   . Bipolar disorder Sister   . Cancer Neg Hx   . Diabetes Neg Hx   . Stroke Neg Hx     Allergies  Allergen Reactions  . Varenicline Tartrate Other (See Comments)    REACTION: personality change     REVIEW OF SYSTEMS  General: [ ]  Weight loss, [ ]  Fever, [ ]  chills Neurologic: [ ]  Dizziness, [ ]  Blackouts, [ ]  Seizure [ ]  Stroke, [ ]  "Mini stroke", [ ]  Slurred speech, [ ]  Temporary blindness; [ ]  weakness in arms or legs, [ ]  Hoarseness [ ]  Dysphagia Cardiac: [ ]  Chest pain/pressure, [ ]  Shortness of breath at rest [ ]  Shortness of breath with exertion, [ ]  Atrial fibrillation or irregular heartbeat  Vascular: [ ]  Pain in legs with walking, [ ]  Pain in legs at rest, [ ]  Pain in legs at night,  [ ]  Non-healing ulcer, [ ]  Blood clot in vein/DVT,   Pulmonary: [ ]  Home oxygen, [ ]  Productive cough, [ ]  Coughing up blood, [ ]  Asthma,  [ ]  Wheezing [ ]  COPD Musculoskeletal:  [ ]  Arthritis, [ ]  Low back pain, [  ] Joint pain Hematologic: [ ]  Easy Bruising, [ ]  Anemia; [ ]  Hepatitis Gastrointestinal: [ ]  Blood in stool, [ ]  Gastroesophageal Reflux/heartburn, Urinary: [ ]  chronic Kidney disease, [ ]  on HD - [ ]  MWF or [ ]  TTHS, [ ]  Burning  with urination, [ ]  Difficulty urinating Skin: [ ]  Rashes, [ ]  Wounds Psychological: [ ]  Anxiety, [ ]  Depression  Physical Examination Filed Vitals:   09/22/13 1301 09/22/13 1303  BP: 125/80 124/79  Pulse: 58 57  Resp: 18   Height: 5\' 6"  (1.676 m)   Weight: 139 lb (63.05 kg)    Body mass index is 22.45 kg/(m^2).  General:  WDWN in NAD Gait: Normal HENT: WNL Eyes: Pupils equal Pulmonary: normal non-labored breathing , without Rales, rhonchi,  wheezing Cardiac: RRR, without  Murmurs, rubs or gallops; no peripheral edema. No carotid bruits Abdomen: soft, NT, no masses Skin: no rashes, ulcers noted;  no Gangrene , no cellulitis; no open wounds   Vascular Exam/Pulses: VASCULAR EXAM  Carotid Bruits Left Right   Negative Negative    Aorta is slightly palpable. Bilateral radial pulses are 2+ palpable.                         VASCULAR EXAM: Extremities without ischemic changes  without Gangrene; without open wounds.                                                                                                          LE Pulses LEFT RIGHT       FEMORAL   palpable   palpable        POPLITEAL  not palpable   not palpable       POSTERIOR TIBIAL   palpable    palpable        DORSALIS PEDIS      ANTERIOR TIBIAL not palpable  not palpable      Musculoskeletal: no muscle wasting or atrophy; no edema  Neurologic: A&O X 3; Appropriate Affect ;  SENSATION: normal; MOTOR FUNCTION: 5/5 Symmetric, CN 2-12 intact Speech is fluent/normal   Significant Diagnostic Studies: AAA Duplex (02/19/13):  Obtained after abd bruit was heard - AAA duplex showing no aneurysm but diffuse ATH and moderate bilat common iliac artery stenosis and severe celiac and SMA  artery stenosis.  Pt is asymptomatic. Continues to smoke. Referred to Dr. Hart Rochester at that time.   Carotid Duplex (09/10/12): Occlusion of left ICA. Patent right ICA, CEA site.  Carotid Duplex (09/22/2013):  Patent right carotid endarterectomy site with less than 40% right ICA stenosis. Known occluded left ICA. No significant change from 09/10/12.  ASSESSMENT:  Bobby MOTYKA Sr. is a 67 y.o. male status post right CEA 07/28/2008 who had a left brain TIA just prior to surgery and no TIA or stroke symptoms since then. Even though AAA Duplex on 02/19/13 shows  moderate bilateral common iliac artery stenosis and severe celiac and SMA artery stenosis, patient is asymptomatic with no food fear and no recent weight loss. He has strongly palpable pedal pulses with no signs of LE ischemia, no claudication symptoms. He walks a great deal which reduces his risk of peripheral artery disease progression, however he smokes 1/4 ppd which increases his risk and he is exposed to his 3 PPD smoking  wife.   PLAN:   Return in 1 year for carotid Duplex and evaluation by NP. I discussed in depth with the patient the nature of atherosclerosis, and emphasized the importance of maximal medical management including strict control of blood pressure, blood glucose, and lipid levels, obtaining regular exercise, and cessation of smoking.  The patient is aware that without maximal medical management the underlying atherosclerotic disease process will progress, limiting the benefit of any interventions. The patient was given information about stroke prevention and what symptoms should prompt the patient to seek immediate medical care. The patient was given information about AAA including signs, symptoms, treatment,  what symptoms should prompt the patient to seek immediate medical care, and how to minimize the risk of enlargement and rupture of aneurysms. The patient was also given information about smoking  cessation.  Charisse March, RN, MSN, FNP-C [facility] Office: [phone]  Clinic MD: Early 09/22/2013 1:10 PM

## 2013-09-23 NOTE — Addendum Note (Signed)
Addended by: Sharee Pimple on: 09/23/2013 08:03 AM   Modules accepted: Orders

## 2013-10-29 ENCOUNTER — Other Ambulatory Visit: Payer: Self-pay

## 2013-10-29 ENCOUNTER — Encounter: Payer: Self-pay | Admitting: Family Medicine

## 2013-10-29 ENCOUNTER — Ambulatory Visit (INDEPENDENT_AMBULATORY_CARE_PROVIDER_SITE_OTHER): Payer: Medicare Other | Admitting: Family Medicine

## 2013-10-29 VITALS — BP 148/80 | HR 72 | Temp 98.5°F | Wt 138.2 lb

## 2013-10-29 DIAGNOSIS — G894 Chronic pain syndrome: Secondary | ICD-10-CM

## 2013-10-29 DIAGNOSIS — M47816 Spondylosis without myelopathy or radiculopathy, lumbar region: Secondary | ICD-10-CM

## 2013-10-29 DIAGNOSIS — M1710 Unilateral primary osteoarthritis, unspecified knee: Secondary | ICD-10-CM

## 2013-10-29 DIAGNOSIS — M47817 Spondylosis without myelopathy or radiculopathy, lumbosacral region: Secondary | ICD-10-CM

## 2013-10-29 DIAGNOSIS — M961 Postlaminectomy syndrome, not elsewhere classified: Secondary | ICD-10-CM

## 2013-10-29 DIAGNOSIS — M1990 Unspecified osteoarthritis, unspecified site: Secondary | ICD-10-CM

## 2013-10-29 DIAGNOSIS — Z23 Encounter for immunization: Secondary | ICD-10-CM

## 2013-10-29 DIAGNOSIS — IMO0002 Reserved for concepts with insufficient information to code with codable children: Secondary | ICD-10-CM

## 2013-10-29 MED ORDER — GABAPENTIN 300 MG PO CAPS: ORAL_CAPSULE | ORAL | Status: DC

## 2013-10-29 MED ORDER — HYDROCODONE-ACETAMINOPHEN 7.5-325 MG PO TABS: ORAL_TABLET | ORAL | Status: DC

## 2013-10-29 NOTE — Addendum Note (Signed)
Addended by: Josph Macho A on: 10/29/2013 12:07 PM   Modules accepted: Orders

## 2013-10-29 NOTE — Assessment & Plan Note (Signed)
I do believe Bobby Henry has chronic pain from his lumbar DDD as well as his knee OA - and in his case we want to avoid NSAIDs 2/2 CRI. I have placed referral for new pain clinic evaluation. I have provided Bobby Henry with hydrocodone 7.5/325mg  #60 pills to last him while he establishes with another local pain clinic. Pt agrees with plan.

## 2013-10-29 NOTE — Progress Notes (Signed)
Subjective:    Patient ID: Bobby Mimes Sr., male    DOB: Dec 15, 1946, 67 y.o.   MRN: [id number]  HPI CC: discuss pain management referral.  Prior seen by Dr. Jodean Lima pain clinic at [facility] - narcotics were stopped because of 2 negative urine drug screens during time of increased pain after inguinal hernia surgeries.  Has been off narcotics for last 2 months.  Worsening chronic back pain - trouble sleeping at night.  H/o chronic lower back pain from lumbar spondylosis (underwent facet block 03/10/2012 Kirsten's) and lumbar postlaminectomy syndrome. H/o knee pain from tricompartmental right knee osteoarthritis.   Pending R knee replacement.  Prior on hydrocodone 7/325mg  TID #120 monthly.  Was also on MS contin 15mg  bid #60 monthly.  Increased pain over the weekend 2/2 driving to [facility] because of new grandson.  Medications and allergies reviewed and updated in chart.  Past histories reviewed and updated if relevant as below. Patient Active Problem List   Diagnosis Date Noted  . Left leg swelling 07/09/2013  . Acute renal failure 06/01/2013  . Right inguinal hernia 02/12/2013  . Abdominal bruit 02/12/2013  . Left wrist pain 02/12/2013  . Chronic tophaceous gout 01/24/2013  . Acute periodontitis 01/02/2013  . Right knee pain 05/09/2012  . OA (osteoarthritis) of knee 05/09/2012  . Lumbar postlaminectomy syndrome 03/10/2012  . Lumbar spondylosis 03/10/2012  . Medicare annual wellness visit, initial 11/12/2011  . NECK MASS 01/08/2011  . SUPERFICIAL VEIN THROMBOSIS 11/15/2010  . OCCLUSION&STENOS CAROTID ART W/O MENTION INFARCT 07/23/2008  . PULMONARY NODULE, SOLITARY 01/05/2008  . SEBACEOUS CYST, SCALP 04/21/2007  . DIABETES MELLITUS II, UNCOMPLICATED 02/20/2007  . HYPERCHOLESTEROLEMIA 02/20/2007  . 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 TOBACCO DEPENDENCE 02/20/2007  . HYPERTENSION, BENIGN SYSTEMIC 02/20/2007  . GASTROESOPHAGEAL REFLUX, NO ESOPHAGITIS 02/20/2007  .  OSTEOARTHRITIS, MULTI SITES 02/20/2007  . DISC WITH RADICULOPATHY 02/20/2007   Past Medical History  Diagnosis Date  . HTN (hypertension)   . HLD (hyperlipidemia)   . DMII (diabetes mellitus, type 2)   . GERD (gastroesophageal reflux disease)   . PUD (peptic ulcer disease)   . Tobacco abuse   . Hx-TIA (transient ischemic attack) 2009  . Chronic low back pain   . Psychosexual dysfunction with inhibited sexual excitement   . Carotid stenosis     s/p CEA - Dr. Hart Rochester  . Osteoarthritis   . Solitary pulmonary nodule     solitary pulmonary nodule (enlarged 2011)-follow up - stable on 2012 CT Delford Field)  . Embolism and thrombosis of unspecified site   . Diverticulosis   . 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 Inguinal hernia   . Atherosclerosis of arteries 02/2013    severe stenosis of SMA, celiac, moderate bilateral common iliac stenosis  . Myocardial infarction     ?  . Stroke     tia hx  . Left wrist fracture 01/2013    evidence of old scaphoid fracture with scapholunate advanced collapse Marry Guan) - declined ortho referral   Past Surgical History  Procedure Laterality Date  . Nose surgery  1982  . Neck surgery  1984  . Knee surgery Bilateral 1997    menisectomy  . Carotid endarterectomy Right 5/09    with dacron patch angioplasty with resection of redundant carotid artery with primary reanastomosis  . Esophagogastroduodenoscopy  2007    deformed pylorus; gastric ulcer  . Gastrojejunostomy  4/09    laparoscopic; and high selective  anterior and posterior vagotomies  . Back surgery    . Cervical spine surgery    . Colonoscopy  2008    diverticulosis (Dr. Bosie Clos) repeat 2018  . Cardiac catheterization  13  . Inguinal hernia repair Right 03/17/2013    Procedure: HERNIA REPAIR INGUINAL ADULT;  Surgeon: [name], DO;  Location: [facility] OR;  Service: General;  Laterality: Right;  . Insertion of mesh Right 03/17/2013    Procedure: INSERTION  OF MESH ;  Surgeon: [name], DO;  Location: [facility] OR;  Service: General;  Laterality: Right;   History  Substance Use Topics  . Smoking status: Current Every Day Smoker -- 0.25 packs/day for 33 years    Types: Cigarettes  . Smokeless tobacco: Never Used  . Alcohol Use: No   Family History  Problem Relation Age of Onset  . Heart attack Father     CABG 6v  . Arthritis Father   . Alzheimer's disease Mother   . Arthritis Mother   . Bipolar disorder Sister   . Bipolar disorder Sister   . Cancer Neg Hx   . Diabetes Neg Hx   . Stroke Neg Hx    Allergies  Allergen Reactions  . Varenicline Tartrate Other (See Comments)    REACTION: personality change   Current Outpatient Prescriptions on File Prior to Visit  Medication Sig Dispense Refill  . amLODipine (NORVASC) 10 MG tablet TAKE 1 TABLET BY MOUTH EVERY DAY  30 tablet  11  . aspirin EC 81 MG tablet Take 81 mg by mouth daily.      [name] atorvastatin (LIPITOR) 20 MG tablet Take 20 mg by mouth daily.      . enalapril-hydrochlorothiazide (VASERETIC) 10-25 MG per tablet Take 1 tablet by mouth daily.       [name] gabapentin (NEURONTIN) 300 MG capsule TAKE 1 CAPSULE (300 MG TOTAL) BY MOUTH 4 (FOUR) TIMES DAILY.  120 capsule  3  . glipiZIDE (GLUCOTROL) 10 MG tablet Take 10 mg by mouth 2 (two) times daily before a meal.      . metFORMIN (GLUCOPHAGE-XR) 750 MG 24 hr tablet Take 750 mg by mouth daily with breakfast.      . metoprolol (LOPRESSOR) 50 MG tablet Take 75 mg by mouth 2 (two) times daily.      [name] omeprazole (PRILOSEC OTC) 20 MG tablet Take 20 mg by mouth daily.       [name] HYDROcodone-acetaminophen (NORCO) 7.5-325 MG per tablet Take 1 tid for one week, then 1 bid for one week, then 1 q day for one week and stop.  42 tablet  0   No current facility-administered medications on file prior to visit.     Review of Systems Per HPI    Objective:   Physical Exam  Nursing note and vitals reviewed. Constitutional: He appears well-developed and  well-nourished. No distress.  Stiff with movements, uncomfortable  Musculoskeletal: He exhibits no edema.  Skin: Skin is warm and dry. No rash noted.  Psychiatric: He has a normal mood and affect.       Assessment & Plan:

## 2013-10-29 NOTE — Patient Instructions (Signed)
Flu shot today. Try to avoid advil. May use tylenol 500mg  up to three times daily. Use hydrocodone sparingly - prescription provided today while we get you established with pain clinic.

## 2013-10-29 NOTE — Progress Notes (Signed)
Pre-visit discussion using our clinic review tool. No additional management support is needed unless otherwise documented below in the visit note.  

## 2013-11-20 ENCOUNTER — Other Ambulatory Visit: Payer: Self-pay | Admitting: *Deleted

## 2013-11-20 NOTE — Telephone Encounter (Signed)
Pt requesting hydrocodone rx. He states he has been referred to a pain clinic, but that Dr hasn't started prescribing his meds yet. Request a call when rx is ready to be picked up or if there's a problem with writing rx.

## 2013-11-20 NOTE — Telephone Encounter (Signed)
Electronic refill request.  Please advise. 

## 2013-11-20 NOTE — Telephone Encounter (Signed)
Too early to fill.  Had 30 day supply written on 10/29/13.  Will defer to PCP.

## 2013-11-23 ENCOUNTER — Other Ambulatory Visit: Payer: Self-pay | Admitting: Family Medicine

## 2013-11-23 ENCOUNTER — Other Ambulatory Visit (HOSPITAL_COMMUNITY): Payer: Self-pay

## 2013-11-23 ENCOUNTER — Other Ambulatory Visit (HOSPITAL_COMMUNITY): Payer: Self-pay | Admitting: Pain Medicine

## 2013-11-23 ENCOUNTER — Encounter: Payer: Self-pay | Admitting: Family Medicine

## 2013-11-23 DIAGNOSIS — M545 Low back pain: Secondary | ICD-10-CM

## 2013-11-23 DIAGNOSIS — E7800 Pure hypercholesterolemia, unspecified: Secondary | ICD-10-CM

## 2013-11-23 MED ORDER — HYDROCODONE-ACETAMINOPHEN 7.5-325 MG PO TABS: ORAL_TABLET | ORAL | Status: DC

## 2013-11-23 NOTE — Telephone Encounter (Signed)
Patient notified and Rx placed up front for pick up. Advised to bring ID. He is established with Dr. Shawnee Knapp @ [facility]. He has MRI on Dec.11 then will follow up with her afterwards-no follow up appt scheduled as of yet.

## 2013-11-23 NOTE — Telephone Encounter (Signed)
Script printed and placed in [name]' box. Can we get an update on when appt with new pain clinic is?

## 2013-11-24 ENCOUNTER — Telehealth: Payer: Self-pay | Admitting: *Deleted

## 2013-11-24 ENCOUNTER — Other Ambulatory Visit: Payer: Self-pay | Admitting: Family Medicine

## 2013-11-24 NOTE — Telephone Encounter (Signed)
Patient called and left message that he could not give urine for UDS yesterday when he came to pick up Rx. He was unaware that he would need to provide sample and couldn't urinate. He was asking when he needed to come back. Advised patient he needed to return today if possible. He advised he couldn't return today because his MIL was having plumbing issues that he was having to fix. I advised he needed to come back tomorrow before closing. He verbalized understanding.

## 2013-11-25 ENCOUNTER — Other Ambulatory Visit: Payer: Medicare Other

## 2013-11-25 ENCOUNTER — Other Ambulatory Visit (HOSPITAL_COMMUNITY): Payer: Medicare Other

## 2013-11-25 ENCOUNTER — Other Ambulatory Visit (INDEPENDENT_AMBULATORY_CARE_PROVIDER_SITE_OTHER): Payer: Medicare Other

## 2013-11-25 ENCOUNTER — Encounter: Payer: Self-pay | Admitting: Family Medicine

## 2013-11-25 DIAGNOSIS — E7800 Pure hypercholesterolemia, unspecified: Secondary | ICD-10-CM

## 2013-11-25 NOTE — Telephone Encounter (Signed)
Noted. Agree.

## 2013-11-26 LAB — LDL CHOLESTEROL, DIRECT: Direct LDL: 59.8 mg/dL

## 2013-11-27 ENCOUNTER — Telehealth: Payer: Self-pay

## 2013-11-27 NOTE — Telephone Encounter (Signed)
Christy with [facility] request cb; pt is not due to get Hydrocodone refilled until 11/28/13. Pt is at pharmacy and said he is out of medication. Christy needs verbal OK to fill one day early.Please advise.

## 2013-11-27 NOTE — Telephone Encounter (Signed)
Ok to fill 

## 2013-11-27 NOTE — Telephone Encounter (Signed)
Christy at [facility] notified.

## 2013-12-03 ENCOUNTER — Ambulatory Visit (HOSPITAL_COMMUNITY)
Admission: RE | Admit: 2013-12-03 | Discharge: 2013-12-03 | Disposition: A | Discharge status: Home / Self Care | Payer: Medicare Other | Source: Ambulatory Visit | Attending: Pain Medicine | Admitting: Pain Medicine

## 2013-12-03 DIAGNOSIS — M545 Low back pain, unspecified: Secondary | ICD-10-CM | POA: Insufficient documentation

## 2013-12-03 DIAGNOSIS — M47817 Spondylosis without myelopathy or radiculopathy, lumbosacral region: Secondary | ICD-10-CM | POA: Insufficient documentation

## 2013-12-03 LAB — CREATININE, SERUM
Creatinine, Ser: 1.05 mg/dL (ref 0.50–1.35)
GFR calc Af Amer: 83 mL/min — ABNORMAL LOW (ref >90)
GFR calc non Af Amer: 71 mL/min — ABNORMAL LOW (ref >90)

## 2013-12-03 MED ORDER — GADOBENATE DIMEGLUMINE 529 MG/ML IV SOLN
15.0000 mL | Freq: Once | INTRAVENOUS | Status: AC | PRN
  Administered 2013-12-03: 13 mL via INTRAVENOUS

## 2013-12-29 ENCOUNTER — Encounter: Payer: Self-pay | Admitting: Family Medicine

## 2014-01-07 ENCOUNTER — Encounter: Payer: Self-pay | Admitting: Family Medicine

## 2014-01-13 ENCOUNTER — Other Ambulatory Visit: Payer: Self-pay | Admitting: Family Medicine

## 2014-03-07 ENCOUNTER — Other Ambulatory Visit: Payer: Self-pay | Admitting: Family Medicine

## 2014-04-01 ENCOUNTER — Other Ambulatory Visit: Payer: Self-pay

## 2014-04-21 ENCOUNTER — Encounter: Payer: Medicare Other | Admitting: Family Medicine

## 2014-06-02 ENCOUNTER — Other Ambulatory Visit: Payer: Self-pay | Admitting: Family Medicine

## 2014-06-29 ENCOUNTER — Other Ambulatory Visit: Payer: Self-pay | Admitting: Family Medicine

## 2014-07-04 ENCOUNTER — Other Ambulatory Visit: Payer: Self-pay | Admitting: Family Medicine

## 2014-07-15 ENCOUNTER — Ambulatory Visit: Payer: Medicare Other | Admitting: Family Medicine

## 2014-08-13 ENCOUNTER — Encounter: Payer: Self-pay | Admitting: Family Medicine

## 2014-08-13 ENCOUNTER — Ambulatory Visit (INDEPENDENT_AMBULATORY_CARE_PROVIDER_SITE_OTHER): Payer: Medicare Other | Admitting: Family Medicine

## 2014-08-13 VITALS — BP 160/70 | HR 62 | Temp 98.3°F | Wt 144.0 lb

## 2014-08-13 DIAGNOSIS — I10 Essential (primary) hypertension: Secondary | ICD-10-CM

## 2014-08-13 DIAGNOSIS — E7800 Pure hypercholesterolemia, unspecified: Secondary | ICD-10-CM

## 2014-08-13 DIAGNOSIS — G894 Chronic pain syndrome: Secondary | ICD-10-CM

## 2014-08-13 DIAGNOSIS — Z23 Encounter for immunization: Secondary | ICD-10-CM

## 2014-08-13 DIAGNOSIS — F17200 Nicotine dependence, unspecified, uncomplicated: Secondary | ICD-10-CM

## 2014-08-13 DIAGNOSIS — E118 Type 2 diabetes mellitus with unspecified complications: Secondary | ICD-10-CM

## 2014-08-13 MED ORDER — TRAMADOL HCL 50 MG PO TABS: 50.0000 mg | ORAL_TABLET | Freq: Two times a day (BID) | ORAL | Status: DC | PRN

## 2014-08-13 NOTE — Progress Notes (Signed)
BP 160/70  Pulse 62  Temp(Src) 98.3 F (36.8 C) (Tympanic)  Wt 144 lb (65.318 kg)  SpO2 98%   CC: DM bundle  Subjective:    Patient ID: Bobby Mimesonald B Dente Sr., male    DOB: 02-24-1946, 68 y.o.   MRN: [id number]  HPI: Bobby MimesRonald B Clinger Sr. is a 68 y.o. male presenting on 08/13/2014 for Follow-up   DM - regularly does not check sugars. Compliant with antihyperglycemic regimen which includes: metformin XR 750mg  daily and glipizide 10mg  bid with meals.  Denies low hypoglycemic symptoms.  Denies paresthesias. Last diabetic eye exam DUE.  Pneumovax: 10/2011.  Prevnar: today.  Lab Results  Component Value Date   HGBA1C 6.5 08/11/2013    HTN - compliant with meds. bp elevated today. Has not missed any doses of meds. HLD - compliant with lipitor but unsure what dose he's taking.  Smoker - 1/4 ppd. Trying to cut back. BP Readings from Last 3 Encounters:  08/13/14 160/70  10/29/13 148/80  09/22/13 124/79    Chronic pain - off hydrocodone, was discharged from pain clinic 2/2 noncompliance. Asks about tramadol. No seizure history.  Relevant past medical, surgical, family and social history reviewed and updated as indicated.  Allergies and medications reviewed and updated. Current Outpatient Prescriptions on File Prior to Visit  Medication Sig  . amLODipine (NORVASC) 10 MG tablet TAKE 1 TABLET BY MOUTH EVERY DAY  . aspirin EC 81 MG tablet Take 81 mg by mouth daily.  [name]. atorvastatin (LIPITOR) 40 MG tablet TAKE 1 TABLET BY MOUTH EVERY DAY  . glipiZIDE (GLUCOTROL) 10 MG tablet TAKE 1 TABLET BY MOUTH TWICE A DAY  . metFORMIN (GLUCOPHAGE-XR) 750 MG 24 hr tablet Take 750 mg by mouth daily with breakfast.  . metoprolol (LOPRESSOR) 50 MG tablet Take 1.5 tablets (75 mg total) by mouth 2 (two) times daily.  [name]. omeprazole (PRILOSEC OTC) 20 MG tablet Take 20 mg by mouth daily.    No current facility-administered medications on file prior to visit.    Review of Systems Per HPI unless specifically  indicated above    Objective:    BP 160/70  Pulse 62  Temp(Src) 98.3 F (36.8 C) (Tympanic)  Wt 144 lb (65.318 kg)  SpO2 98%  Physical Exam  Nursing note and vitals reviewed. Constitutional: He appears well-developed and well-nourished. No distress.  HENT:  Head: Normocephalic and atraumatic.  Right Ear: External ear normal.  Left Ear: External ear normal.  Nose: Nose normal.  Mouth/Throat: Oropharynx is clear and moist. No oropharyngeal exudate.  Eyes: Conjunctivae and EOM are normal. Pupils are equal, round, and reactive to light. No scleral icterus.  Neck: Normal range of motion. Neck supple.  Cardiovascular: Normal rate, regular rhythm, normal heart sounds and intact distal pulses.   No murmur heard. Pulmonary/Chest: Effort normal and breath sounds normal. No respiratory distress. He has no wheezes. He has no rales.  Musculoskeletal: He exhibits no edema.  Diabetic foot exam: Normal inspection No skin breakdown No calluses  Diminished pulses Hammer toe 2nd bilaterally onychomycotic elongated toenails throughout Normal sensation to light tough and monofilament  Lymphadenopathy:    He has no cervical adenopathy.  Skin: Skin is warm and dry. No rash noted.  Psychiatric: He has a normal mood and affect.   Results for orders placed during the hospital encounter of 12/03/13  CREATININE, SERUM      Result Value Ref Range   Creatinine, Ser 1.05  0.50 - 1.35 mg/dL   GFR  calc non Af Amer 71 (*) >90 mL/min   GFR calc Af Amer 83 (*) >90 mL/min      Assessment & Plan:   Problem List Items Addressed This Visit   Chronic pain syndrome     Known lumbar DDD and knee OA. Will try out tramadol. Avoid NSAIDs 2/2 CRI.    Diabetes mellitus type 2, controlled, with complications - Primary     rec schedule eye exam. Continue regimen. Always well controlled in past.  Check A1c today. Foot exam today.     Relevant Medications      enalapril-hydrochlorothiazide (VASERETIC) 10-25  MG per tablet   Other Relevant Orders      HM DIABETES FOOT EXAM (Completed)      Hemoglobin A1c   HYPERCHOLESTEROLEMIA     Chronic. Check d LDL today as pt not fasting.    Relevant Medications      enalapril-hydrochlorothiazide (VASERETIC) 10-25 MG per tablet   Other Relevant Orders      LDL Cholesterol, Direct   HYPERTENSION, BENIGN SYSTEMIC     Chronic but uncontrolled today. Advised he monitor bp daily over net week and keep log, and call me in 1 wk with update. If persistently elevated bp, will change antihypertensive regimen.    Relevant Medications      enalapril-hydrochlorothiazide (VASERETIC) 10-25 MG per tablet   Other Relevant Orders      Basic metabolic panel   TOBACCO DEPENDENCE     Continue to encourage cessation. Pt slowly cutting back.        Follow up plan: Return if symptoms worsen or fail to improve, for medicare wellness.

## 2014-08-13 NOTE — Addendum Note (Signed)
Addended by: Josph MachoANCE, Vallerie Hentz A on: 08/13/2014 04:00 PM   Modules accepted: Orders

## 2014-08-13 NOTE — Patient Instructions (Addendum)
prevnar and flu shots today. Schedule eye exam as you're due. Foot exam today. Start checking blood pressure once daily for the next week and keep log - call me in 1 week with numbers and if remaining elevated we will need to change meds around. May try tramadol for pain. Good to see you today, call us with quesitons. Return to see me in 1 month for follow up

## 2014-08-13 NOTE — Progress Notes (Signed)
Pre visit review using our clinic review tool, if applicable. No additional management support is needed unless otherwise documented below in the visit note. 

## 2014-08-13 NOTE — Assessment & Plan Note (Signed)
Chronic. Check d LDL today as pt not fasting.

## 2014-08-13 NOTE — Addendum Note (Signed)
Addended by: Sueanne MargaritaSMITH, DESHANNON L on: 08/13/2014 03:27 PM   Modules accepted: Orders

## 2014-08-13 NOTE — Assessment & Plan Note (Signed)
Continue to encourage cessation. Pt slowly cutting back.

## 2014-08-13 NOTE — Assessment & Plan Note (Signed)
rec schedule eye exam. Continue regimen. Always well controlled in past.  Check A1c today. Foot exam today.

## 2014-08-13 NOTE — Assessment & Plan Note (Signed)
Chronic but uncontrolled today. Advised he monitor bp daily over net week and keep log, and call me in 1 wk with update. If persistently elevated bp, will change antihypertensive regimen.

## 2014-08-13 NOTE — Assessment & Plan Note (Signed)
Known lumbar DDD and knee OA. Will try out tramadol. Avoid NSAIDs 2/2 CRI.

## 2014-08-14 LAB — HEMOGLOBIN A1C
Hgb A1c MFr Bld: 6.1 % — ABNORMAL HIGH (ref <5.7)
MEAN PLASMA GLUCOSE: 128 mg/dL — AB (ref <117)

## 2014-08-14 LAB — BASIC METABOLIC PANEL
BUN: 15 mg/dL (ref 6–23)
CALCIUM: 9 mg/dL (ref 8.4–10.5)
CHLORIDE: 107 meq/L (ref 96–112)
CO2: 25 mEq/L (ref 19–32)
CREATININE: 1.04 mg/dL (ref 0.50–1.35)
Glucose, Bld: 61 mg/dL — ABNORMAL LOW (ref 70–99)
Potassium: 3.7 mEq/L (ref 3.5–5.3)
Sodium: 141 mEq/L (ref 135–145)

## 2014-08-14 LAB — LDL CHOLESTEROL, DIRECT: Direct LDL: 56 mg/dL

## 2014-08-15 ENCOUNTER — Other Ambulatory Visit: Payer: Self-pay | Admitting: Family Medicine

## 2014-08-15 MED ORDER — GLIPIZIDE 5 MG PO TABS: ORAL_TABLET | ORAL | Status: DC

## 2014-08-16 ENCOUNTER — Telehealth: Payer: Self-pay | Admitting: Family Medicine

## 2014-08-16 NOTE — Telephone Encounter (Signed)
Relevant patient education assigned to patient using Emmi. ° °

## 2014-08-18 ENCOUNTER — Telehealth: Payer: Self-pay | Admitting: Family Medicine

## 2014-08-18 ENCOUNTER — Other Ambulatory Visit: Payer: Self-pay | Admitting: *Deleted

## 2014-08-18 DIAGNOSIS — I10 Essential (primary) hypertension: Secondary | ICD-10-CM

## 2014-08-18 MED ORDER — ENALAPRIL-HYDROCHLOROTHIAZIDE 10-25 MG PO TABS: ORAL_TABLET | ORAL | Status: DC

## 2014-08-18 MED ORDER — AMLODIPINE BESYLATE 10 MG PO TABS: ORAL_TABLET | ORAL | Status: DC

## 2014-08-18 MED ORDER — METFORMIN HCL ER 750 MG PO TB24: 750.0000 mg | ORAL_TABLET | Freq: Every day | ORAL | Status: DC

## 2014-08-18 NOTE — Telephone Encounter (Signed)
See below.   Meds refilled

## 2014-08-18 NOTE — Telephone Encounter (Signed)
Pt called to give dr g bp readings 08/15/14    182/32   i double check this number 08/16/14    182/82 08/17/14    167/87 08/18/14    162/82  Pt states his wife was in hospital and he missed placed them  Pt needs refills on Amlodipine  Enalapril-hctz  Metformin   [facility] [address]

## 2014-08-18 NOTE — Telephone Encounter (Signed)
Are these blood pressures while taking amlodipine and enalapril hctz or had he lost meds so was just taking metoprolol for blood pressure?

## 2014-08-19 NOTE — Telephone Encounter (Signed)
Spoke with patient and these readings were just with the metoprolol.

## 2014-08-19 NOTE — Telephone Encounter (Signed)
Enalapril-HCTZ or Lisinopril-HCTZ? Med list said enalapril-hctz which is what I sent in for him. Lab appt scheduled and he will call with readings in 2 weeks.

## 2014-08-19 NOTE — Telephone Encounter (Signed)
Noted. Call us with BP readings 2 wks after starting all bp meds. Return for Cr check 10 days after starting lisinopril hctz twice daily. Lab Results  Component Value Date   CREATININE 1.04 08/13/2014

## 2014-08-20 NOTE — Telephone Encounter (Signed)
You are correct - enalapril.

## 2014-08-31 ENCOUNTER — Other Ambulatory Visit (INDEPENDENT_AMBULATORY_CARE_PROVIDER_SITE_OTHER): Payer: Medicare Other

## 2014-08-31 DIAGNOSIS — I10 Essential (primary) hypertension: Secondary | ICD-10-CM

## 2014-08-31 LAB — CREATININE, SERUM: Creatinine, Ser: 1 mg/dL (ref 0.4–1.5)

## 2014-09-01 ENCOUNTER — Other Ambulatory Visit: Payer: Self-pay | Admitting: Family Medicine

## 2014-09-01 MED ORDER — GLIPIZIDE 5 MG PO TABS: ORAL_TABLET | ORAL | Status: DC

## 2014-09-05 ENCOUNTER — Other Ambulatory Visit: Payer: Self-pay | Admitting: Family Medicine

## 2014-09-14 ENCOUNTER — Ambulatory Visit: Payer: Medicare Other | Admitting: Family Medicine

## 2014-09-14 DIAGNOSIS — Z0289 Encounter for other administrative examinations: Secondary | ICD-10-CM

## 2014-09-15 ENCOUNTER — Telehealth: Payer: Self-pay | Admitting: Family Medicine

## 2014-09-15 NOTE — Telephone Encounter (Signed)
Patient did not come for their scheduled appointment 09/14/14 for 1 month follow up.  Please let me know if the patient needs to be contacted immediately for follow up or if no follow up is necessary.

## 2014-09-20 NOTE — Telephone Encounter (Signed)
plz either schedule f/u appt in next month or have pt call us with bp readings over last few weeks - pt preference.

## 2014-09-20 NOTE — Telephone Encounter (Signed)
Pt stated he would call back the bp readings

## 2014-09-27 ENCOUNTER — Encounter: Payer: Self-pay | Admitting: Family

## 2014-09-28 ENCOUNTER — Ambulatory Visit (HOSPITAL_COMMUNITY)
Admission: RE | Admit: 2014-09-28 | Discharge: 2014-09-28 | Disposition: A | Discharge status: Home / Self Care | Payer: Medicare Other | Source: Ambulatory Visit | Attending: Family | Admitting: Family

## 2014-09-28 ENCOUNTER — Encounter: Payer: Self-pay | Admitting: Family

## 2014-09-28 ENCOUNTER — Ambulatory Visit (INDEPENDENT_AMBULATORY_CARE_PROVIDER_SITE_OTHER): Payer: Medicare Other | Admitting: Family

## 2014-09-28 VITALS — BP 124/75 | HR 60 | Resp 16 | Ht 66.0 in | Wt 140.0 lb

## 2014-09-28 DIAGNOSIS — Z48812 Encounter for surgical aftercare following surgery on the circulatory system: Secondary | ICD-10-CM | POA: Insufficient documentation

## 2014-09-28 DIAGNOSIS — I6529 Occlusion and stenosis of unspecified carotid artery: Secondary | ICD-10-CM | POA: Insufficient documentation

## 2014-09-28 DIAGNOSIS — I6523 Occlusion and stenosis of bilateral carotid arteries: Secondary | ICD-10-CM | POA: Insufficient documentation

## 2014-09-28 NOTE — Patient Instructions (Signed)
Stroke Prevention Some medical conditions and behaviors are associated with an increased chance of having a stroke. You may prevent a stroke by making healthy choices and managing medical conditions. HOW CAN I REDUCE MY RISK OF HAVING A STROKE?   Stay physically active. Get at least 30 minutes of activity on most or all days.  Do not smoke. It may also be helpful to avoid exposure to secondhand smoke.  Limit alcohol use. Moderate alcohol use is considered to be:  No more than 2 drinks per day for men.  No more than 1 drink per day for nonpregnant women.  Eat healthy foods. This involves:  Eating 5 or more servings of fruits and vegetables a day.  Making dietary changes that address high blood pressure (hypertension), high cholesterol, diabetes, or obesity.  Manage your cholesterol levels.  Making food choices that are high in fiber and low in saturated fat, trans fat, and cholesterol may control cholesterol levels.  Take any prescribed medicines to control cholesterol as directed by your health care provider.  Manage your diabetes.  Controlling your carbohydrate and sugar intake is recommended to manage diabetes.  Take any prescribed medicines to control diabetes as directed by your health care provider.  Control your hypertension.  Making food choices that are low in salt (sodium), saturated fat, trans fat, and cholesterol is recommended to manage hypertension.  Take any prescribed medicines to control hypertension as directed by your health care provider.  Maintain a healthy weight.  Reducing calorie intake and making food choices that are low in sodium, saturated fat, trans fat, and cholesterol are recommended to manage weight.  Stop drug abuse.  Avoid taking birth control pills.  Talk to your health care provider about the risks of taking birth control pills if you are over 35 years old, smoke, get migraines, or have ever had a blood clot.  Get evaluated for sleep  disorders (sleep apnea).  Talk to your health care provider about getting a sleep evaluation if you snore a lot or have excessive sleepiness.  Take medicines only as directed by your health care provider.  For some people, aspirin or blood thinners (anticoagulants) are helpful in reducing the risk of forming abnormal blood clots that can lead to stroke. If you have the irregular heart rhythm of atrial fibrillation, you should be on a blood thinner unless there is a good reason you cannot take them.  Understand all your medicine instructions.  Make sure that other conditions (such as anemia or atherosclerosis) are addressed. SEEK IMMEDIATE MEDICAL CARE IF:   You have sudden weakness or numbness of the face, arm, or leg, especially on one side of the body.  Your face or eyelid droops to one side.  You have sudden confusion.  You have trouble speaking (aphasia) or understanding.  You have sudden trouble seeing in one or both eyes.  You have sudden trouble walking.  You have dizziness.  You have a loss of balance or coordination.  You have a sudden, severe headache with no known cause.  You have new chest pain or an irregular heartbeat. Any of these symptoms may represent a serious problem that is an emergency. Do not wait to see if the symptoms will go away. Get medical help at once. Call your local emergency services (911 in [place] Do not drive yourself to the hospital. Document Released: 01/17/2005 Document Revised: 04/26/2014 Document Reviewed: 06/12/2013 ExitCare Patient Information 2015 [employer]. This information is not intended to replace advice given   to you by your health care provider. Make sure you discuss any questions you have with your health care provider.   Smoking Cessation Quitting smoking is important to your health and has many advantages. However, it is not always easy to quit since nicotine is a very addictive drug. Oftentimes, people try 3 times or more  before being able to quit. This document explains the best ways for you to prepare to quit smoking. Quitting takes hard work and a lot of effort, but you can do it. ADVANTAGES OF QUITTING SMOKING  You will live longer, feel better, and live better.  Your body will feel the impact of quitting smoking almost immediately.  Within 20 minutes, blood pressure decreases. Your pulse returns to its normal level.  After 8 hours, carbon monoxide levels in the blood return to normal. Your oxygen level increases.  After 24 hours, the chance of having a heart attack starts to decrease. Your breath, hair, and body stop smelling like smoke.  After 48 hours, damaged nerve endings begin to recover. Your sense of taste and smell improve.  After 72 hours, the body is virtually free of nicotine. Your bronchial tubes relax and breathing becomes easier.  After 2 to 12 weeks, lungs can hold more air. Exercise becomes easier and circulation improves.  The risk of having a heart attack, stroke, cancer, or lung disease is greatly reduced.  After 1 year, the risk of coronary heart disease is cut in half.  After 5 years, the risk of stroke falls to the same as a nonsmoker.  After 10 years, the risk of lung cancer is cut in half and the risk of other cancers decreases significantly.  After 15 years, the risk of coronary heart disease drops, usually to the level of a nonsmoker.  If you are pregnant, quitting smoking will improve your chances of having a healthy baby.  The people you live with, especially any children, will be healthier.  You will have extra money to spend on things other than cigarettes. QUESTIONS TO THINK ABOUT BEFORE ATTEMPTING TO QUIT You may want to talk about your answers with your health care provider.  Why do you want to quit?  If you tried to quit in the past, what helped and what did not?  What will be the most difficult situations for you after you quit? How will you plan to  handle them?  Who can help you through the tough times? Your family? Friends? A health care provider?  What pleasures do you get from smoking? What ways can you still get pleasure if you quit? Here are some questions to ask your health care provider:  How can you help me to be successful at quitting?  What medicine do you think would be best for me and how should I take it?  What should I do if I need more help?  What is smoking withdrawal like? How can I get information on withdrawal? GET READY  Set a quit date.  Change your environment by getting rid of all cigarettes, ashtrays, matches, and lighters in your home, car, or work. Do not let people smoke in your home.  Review your past attempts to quit. Think about what worked and what did not. GET SUPPORT AND ENCOURAGEMENT You have a better chance of being successful if you have help. You can get support in many ways.  Tell your family, friends, and coworkers that you are going to quit and need their support. Ask them not   to smoke around you.  Get individual, group, or telephone counseling and support. Programs are available at local hospitals and health centers. Call your local health department for information about programs in your area.  Spiritual beliefs and practices may help some smokers quit.  Download a "quit meter" on your computer to keep track of quit statistics, such as how long you have gone without smoking, cigarettes not smoked, and money saved.  Get a self-help book about quitting smoking and staying off tobacco. LEARN NEW SKILLS AND BEHAVIORS  Distract yourself from urges to smoke. Talk to someone, go for a walk, or occupy your time with a task.  Change your normal routine. Take a different route to work. Drink tea instead of coffee. Eat breakfast in a different place.  Reduce your stress. Take a hot bath, exercise, or read a book.  Plan something enjoyable to do every day. Reward yourself for not  smoking.  Explore interactive web-based programs that specialize in helping you quit. GET MEDICINE AND USE IT CORRECTLY Medicines can help you stop smoking and decrease the urge to smoke. Combining medicine with the above behavioral methods and support can greatly increase your chances of successfully quitting smoking.  Nicotine replacement therapy helps deliver nicotine to your body without the negative effects and risks of smoking. Nicotine replacement therapy includes nicotine gum, lozenges, inhalers, nasal sprays, and skin patches. Some may be available over-the-counter and others require a prescription.  Antidepressant medicine helps people abstain from smoking, but how this works is unknown. This medicine is available by prescription.  Nicotinic receptor partial agonist medicine simulates the effect of nicotine in your brain. This medicine is available by prescription. Ask your health care provider for advice about which medicines to use and how to use them based on your health history. Your health care provider will tell you what side effects to look out for if you choose to be on a medicine or therapy. Carefully read the information on the package. Do not use any other product containing nicotine while using a nicotine replacement product.  RELAPSE OR DIFFICULT SITUATIONS Most relapses occur within the first 3 months after quitting. Do not be discouraged if you start smoking again. Remember, most people try several times before finally quitting. You may have symptoms of withdrawal because your body is used to nicotine. You may crave cigarettes, be irritable, feel very hungry, cough often, get headaches, or have difficulty concentrating. The withdrawal symptoms are only temporary. They are strongest when you first quit, but they will go away within 10-14 days. To reduce the chances of relapse, try to:  Avoid drinking alcohol. Drinking lowers your chances of successfully quitting.  Reduce the  amount of caffeine you consume. Once you quit smoking, the amount of caffeine in your body increases and can give you symptoms, such as a rapid heartbeat, sweating, and anxiety.  Avoid smokers because they can make you want to smoke.  Do not let weight gain distract you. Many smokers will gain weight when they quit, usually less than 10 pounds. Eat a healthy diet and stay active. You can always lose the weight gained after you quit.  Find ways to improve your mood other than smoking. FOR MORE INFORMATION  www.smokefree.gov  Document Released: 12/04/2001 Document Revised: 04/26/2014 Document Reviewed: 03/20/2012 ExitCare Patient Information 2015 [employer]. This information is not intended to replace advice given to you by your health care provider. Make sure you discuss any questions you have with your health care   provider.  

## 2014-09-28 NOTE — Progress Notes (Signed)
Established Carotid Patient   History of Present Illness  Bobby Hoh. is a 68 y.o. male who is status post right CEA 07/28/2008 by Dr. Hart Rochester. He returns today for surveillance.  Patient had a left brain TIA just prior to surgery, none since then.  He denies tingling, numbness, or pain in either hand or arm. Patient denies food fear and denies recent weight loss; states his weight has remained 135-145# for the last 4-5 years, he denies post prandial abdominal pain.  He has a history of coronary artery disease not requiring stenting nor CABG, but he was told that he had an MI about 9 years ago of which he was not aware.  Patient denies dyspnea, denies chest pain, and he walks a lot.  Pt. denies claudication in legs.  Pt. denies rest pain;  denies night pain  denies non healing ulcers on lower extremities.    The patient has not had acute back or abdominal pain, but he does have chronic low back pain from degenerative disc disease.  He cares for his wife and his mother-in-law. Unemployed adult stepson also lives with him, and one of his twins. His wife recently had a CABG, she stopped smoking, a tree recently fell on his house, he feels stressed from these events.  Pt Diabetic: Yes, states in good control  Pt smoker: smoker (1/3 ppd, started in his 40's)  Pt meds include: Statin : Yes ASA: Yes Other anticoagulants/antiplatelets: no   Past Medical History  Diagnosis Date  . HTN (hypertension)   . HLD (hyperlipidemia)   . DMII (diabetes mellitus, type 2)   . GERD (gastroesophageal reflux disease)   . PUD (peptic ulcer disease)   . Tobacco abuse   . Hx-TIA (transient ischemic attack) 2009  . Chronic low back pain     [facility]  . Psychosexual dysfunction with inhibited sexual excitement   . Carotid stenosis     s/p CEA - Dr. Hart Rochester  . Osteoarthritis   . Solitary pulmonary nodule     solitary pulmonary nodule (enlarged  2011)-follow up - stable on 2012 CT [facility])  . Embolism and thrombosis of unspecified site   . Diverticulosis   . 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 Inguinal hernia   . Atherosclerosis of arteries 02/2013    severe stenosis of SMA, celiac, moderate bilateral common iliac stenosis  . History of TIA (transient ischemic attack)   . Left wrist fracture 01/2013    evidence of old scaphoid fracture with scapholunate advanced collapse (SLAC) - declined ortho referral  . Myocardial infarction 2007    ?   [name] Stroke     Social History History  Substance Use Topics  . Smoking status: Light Tobacco Smoker -- 0.25 packs/day for 33 years    Types: Cigarettes  . Smokeless tobacco: Never Used  . Alcohol Use: No    Family History Family History  Problem Relation Age of Onset  . Heart attack Father     CABG 6v  . Arthritis Father   . Hypertension Father   . Alzheimer's disease Mother   . Arthritis Mother   . Hypertension Mother   . Bipolar disorder Sister   . Bipolar disorder Sister   . Cancer Neg Hx   . Diabetes Neg Hx   . Stroke Neg Hx     Surgical History Past Surgical History  Procedure Laterality Date  .  Nose surgery  1982  . Neck surgery  1984  . Knee surgery Bilateral 1997    menisectomy  . Carotid endarterectomy Right 5/09    with dacron patch angioplasty with resection of redundant carotid artery with primary reanastomosis  . Esophagogastroduodenoscopy  2007    deformed pylorus; gastric ulcer  . Gastrojejunostomy  4/09    laparoscopic; and high selective anterior and posterior vagotomies  . Back surgery    . Cervical spine surgery    . Colonoscopy  2008    diverticulosis (Dr. Bosie ClosSchooler) repeat 2018  . Cardiac catheterization  13  . Inguinal hernia repair Right 03/17/2013    Procedure: HERNIA REPAIR INGUINAL ADULT;  Surgeon: Lodema PilotBrian Layton, DO;  Location: [facility] OR;  Service: General;  Laterality: Right;  . Insertion of mesh  Right 03/17/2013    Procedure: INSERTION OF MESH ;  Surgeon: Lodema PilotBrian Layton, DO;  Location: [facility] OR;  Service: General;  Laterality: Right;  . Spine surgery      Pinched nerve    Allergies  Allergen Reactions  . Varenicline Tartrate Other (See Comments)    REACTION: personality change    Current Outpatient Prescriptions  Medication Sig Dispense Refill  . amLODipine (NORVASC) 10 MG tablet TAKE 1 TABLET BY MOUTH EVERY DAY  30 tablet  6  . aspirin EC 81 MG tablet Take 81 mg by mouth daily.      [name]. atorvastatin (LIPITOR) 40 MG tablet TAKE 1 TABLET BY MOUTH EVERY DAY  90 tablet  1  . enalapril-hydrochlorothiazide (VASERETIC) 10-25 MG per tablet TAKE 1 TABLET TWICE DAILY  60 tablet  6  . gabapentin (NEURONTIN) 300 MG capsule Take 300 mg by mouth 3 (three) times daily.      [name]. glipiZIDE (GLUCOTROL) 5 MG tablet Take 1 tablet (5 mg total) by mouth 2 (two) times daily before a meal.  60 tablet  6  . metFORMIN (GLUCOPHAGE-XR) 750 MG 24 hr tablet Take 1 tablet (750 mg total) by mouth daily with breakfast.  30 tablet  6  . metoprolol (LOPRESSOR) 50 MG tablet Take 1.5 tablets (75 mg total) by mouth 2 (two) times daily.  90 tablet  11  . omeprazole (PRILOSEC OTC) 20 MG tablet Take 20 mg by mouth daily.       . traMADol (ULTRAM) 50 MG tablet Take 1 tablet (50 mg total) by mouth 2 (two) times daily as needed for moderate pain.  60 tablet  0   No current facility-administered medications for this visit.    Review of Systems : See HPI for pertinent positives and negatives.  Physical Examination  Filed Vitals:   09/28/14 1141 09/28/14 1144  BP: 123/70 124/75  Pulse: 60 60  Resp:  16  Height:  5\' 6"  (1.676 m)  Weight:  140 lb (63.504 kg)  SpO2:  97%   Body mass index is 22.61 kg/(m^2).  General: WDWN in NAD  Gait: Normal  HENT: WNL  Eyes: Pupils equal  Pulmonary: normal non-labored breathing , without Rales, rhonchi, wheezing  Cardiac: RRR, no Murmur detected; no peripheral edema.   Abdomen:  soft, NT, no masses palpated. Skin: no rashes, ulcers noted; no Gangrene , no cellulitis; no open wounds.   VASCULAR EXAM  Carotid Bruits  Left  Right    Negative  Negative   Aorta is slightly palpable.  Bilateral radial pulses are 2+ palpable.   VASCULAR EXAM:  Extremities without ischemic changes  without Gangrene; without open wounds.   LE Pulses  LEFT  RIGHT   FEMORAL  palpable  palpable   POPLITEAL  not palpable  not palpable   POSTERIOR TIBIAL  palpable  palpable   DORSALIS PEDIS  ANTERIOR TIBIAL  not palpable  not palpable    Musculoskeletal: no muscle wasting or atrophy; no edema  Neurologic: A&O X 3; Appropriate Affect ;  SENSATION: normal;  MOTOR FUNCTION: 5/5 Symmetric, CN 2-12 intact  Speech is fluent/normal    Significant Diagnostic Studies:  AAA Duplex (02/19/13):  Obtained after abd bruit was heard - AAA duplex showing no aneurysm but diffuse ATH and moderate bilat common iliac artery stenosis and severe celiac and SMA artery stenosis.  Pt is asymptomatic. Continues to smoke. Referred to Dr. Hart Rochester at that time.   Non-Invasive Vascular Imaging CAROTID DUPLEX 09/28/2014   CEREBROVASCULAR DUPLEX EVALUATION    INDICATION: Follow-up carotid disease     PREVIOUS INTERVENTION(S): Right carotid endarterectomy 07/28/2008 Known occlusion of left internal carotid artery     DUPLEX EXAM:     RIGHT  LEFT  Peak Systolic Velocities (cm/s) End Diastolic Velocities (cm/s) Plaque LOCATION Peak Systolic Velocities (cm/s) End Diastolic Velocities (cm/s) Plaque  100 25  CCA PROXIMAL 83 13   90 25  CCA MID 95 17   95 24  CCA DISTAL 85 17   108 19  ECA 187 29   114 30 HT ICA PROXIMAL Occluded    101 32  ICA MID Occluded    85 35  ICA DISTAL Occluded      NA ICA / CCA Ratio (PSV) NA  Antegrade  Vertebral Flow Antegrade   124 Brachial Systolic Pressure (mmHg) 124  Within normal limits  Brachial Artery Waveforms Within normal limits     Plaque Morphology:  HM =  Homogeneous, HT = Heterogeneous, CP = Calcific Plaque, SP = Smooth Plaque, IP = Irregular Plaque     ADDITIONAL FINDINGS:     IMPRESSION: 1. Patent right carotid endarterectomy with <40% restenosis of the surgical bulb. 2. Confirmed occlusion of the left internal carotid artery. 3. Bilateral vertebral artery is antegrade.    Compared to the previous exam:  No significant change compared to prior exam.     Assessment: Bobby Cossin. is a 68 y.o. male who is status post right CEA 07/28/2008 by Dr. Hart Rochester.  Patient had a left brain TIA just prior to surgery, none since then.  Today's carotid Duplex demonstrates a patent right carotid endarterectomy with <40% restenosis of the surgical bulb. Confirmed occlusion of the left internal carotid artery. No significant change compared to prior exam. He is stressed by his wife's recent CABG and a tree falling on their house, he continues to smoke but seems motivated to quit.  Plan: Follow-up in 1 year with Carotid Duplex scan. He was counseled re smoking cessation.   I discussed in depth with the patient the nature of atherosclerosis, and emphasized the importance of maximal medical management including strict control of blood pressure, blood glucose, and lipid levels, obtaining regular exercise, and cessation of smoking.  The patient is aware that without maximal medical management the underlying atherosclerotic disease process will progress, limiting the benefit of any interventions. The patient was given information about stroke prevention and what symptoms should prompt the patient to seek immediate medical care. Thank you for allowing [place] to participate in this patient's care.  Charisse March, RN, MSN, FNP-C [facility] Office: [phone]  Clinic Physician: Early  09/28/2014 11:47 AM

## 2014-09-30 ENCOUNTER — Encounter: Payer: Self-pay | Admitting: Family Medicine

## 2014-09-30 ENCOUNTER — Other Ambulatory Visit: Payer: Self-pay | Admitting: *Deleted

## 2014-09-30 ENCOUNTER — Telehealth: Payer: Self-pay

## 2014-09-30 DIAGNOSIS — I779 Disorder of arteries and arterioles, unspecified: Secondary | ICD-10-CM

## 2014-09-30 DIAGNOSIS — I739 Peripheral vascular disease, unspecified: Principal | ICD-10-CM

## 2014-09-30 NOTE — Telephone Encounter (Signed)
Left a message for call back.  Called patient regarding diabetic eye exam.  When patient calls back please ask:  Have you had a recent (2014-2015) eye exam?    Date of Exam?  Where?    

## 2014-10-08 ENCOUNTER — Other Ambulatory Visit: Payer: Self-pay | Admitting: Family Medicine

## 2014-10-08 NOTE — Telephone Encounter (Signed)
Rx called in as directed.   

## 2014-10-08 NOTE — Telephone Encounter (Signed)
plz phone in. 

## 2014-10-08 NOTE — Telephone Encounter (Signed)
Ok to refill 

## 2014-10-28 NOTE — Telephone Encounter (Signed)
Pt states it has been years since his last eye exam.  Pt states that it has been so long that he needs an ophthalmologist.  He is requesting a referral.  Pt placed on referral list.

## 2014-11-09 ENCOUNTER — Other Ambulatory Visit: Payer: Self-pay | Admitting: Family Medicine

## 2014-11-09 NOTE — Telephone Encounter (Signed)
Ok to refill? This looks like it was stopped in August, but note doesn't reflect it. It is also asking for QID dosing and the last time he was taking it, he was taking it TID.

## 2014-11-17 ENCOUNTER — Telehealth: Payer: Self-pay | Admitting: Family Medicine

## 2014-11-17 DIAGNOSIS — E118 Type 2 diabetes mellitus with unspecified complications: Secondary | ICD-10-CM

## 2014-11-17 NOTE — Telephone Encounter (Signed)
Referral placed.

## 2014-11-17 NOTE — Telephone Encounter (Signed)
-----   Message from Debarah CrapeAdrienne N Sauls sent at 11/17/2014  3:21 PM EST ----- Please see the telephone note from 09/30/14.  Someone who was working on the diabetic bundle talked with this patient about needed a diabetic eye exam.  Pt said he needed an opthamologist but no referral was set up because it didn't appear to be forwarded to you. ----- Message -----    From: Livingston DionesPhetcharat Noitamyae    Sent: 11/17/2014   3:07 PM      To: Adrienne N Sauls  Hi Adrienne my name is Cindee Lameete and I am helping [place] with [employer]. I was wondering if you can help check on this pt, we do not have an exam result, on the phone note he was suppose to have referral for opthalmology. Can you please check I do not see referral place for this pt.   Thank You Cindee LamePete

## 2014-11-22 NOTE — Telephone Encounter (Addendum)
Referral placed 11/17/2014 (see phone note from 11/17/2014)

## 2014-11-25 ENCOUNTER — Other Ambulatory Visit: Payer: Self-pay | Admitting: Family Medicine

## 2014-12-05 ENCOUNTER — Other Ambulatory Visit: Payer: Self-pay | Admitting: Family Medicine

## 2014-12-06 NOTE — Telephone Encounter (Signed)
Rx called to pharmacy as instructed. 

## 2014-12-06 NOTE — Telephone Encounter (Signed)
plz phone in. 

## 2014-12-11 ENCOUNTER — Other Ambulatory Visit: Payer: Self-pay | Admitting: Family Medicine

## 2014-12-24 LAB — HM DIABETES EYE EXAM

## 2014-12-28 ENCOUNTER — Telehealth: Payer: Self-pay

## 2014-12-28 NOTE — Telephone Encounter (Signed)
[facility] (per referral information provided)  Pt has an appt on 01/04/2015 for his eye exam.

## 2015-01-04 DIAGNOSIS — H02831 Dermatochalasis of right upper eyelid: Secondary | ICD-10-CM | POA: Diagnosis not present

## 2015-01-04 DIAGNOSIS — H02834 Dermatochalasis of left upper eyelid: Secondary | ICD-10-CM | POA: Diagnosis not present

## 2015-01-04 DIAGNOSIS — H2513 Age-related nuclear cataract, bilateral: Secondary | ICD-10-CM | POA: Diagnosis not present

## 2015-01-04 DIAGNOSIS — E119 Type 2 diabetes mellitus without complications: Secondary | ICD-10-CM | POA: Diagnosis not present

## 2015-01-23 ENCOUNTER — Other Ambulatory Visit: Payer: Self-pay | Admitting: Family Medicine

## 2015-01-24 ENCOUNTER — Other Ambulatory Visit: Payer: Self-pay | Admitting: Family Medicine

## 2015-01-24 NOTE — Telephone Encounter (Signed)
plz phone in. 

## 2015-01-25 NOTE — Telephone Encounter (Signed)
Rx called in as directed.   

## 2015-02-09 ENCOUNTER — Ambulatory Visit: Payer: Self-pay | Admitting: Family Medicine

## 2015-02-10 ENCOUNTER — Ambulatory Visit: Payer: Self-pay | Admitting: Family Medicine

## 2015-02-21 ENCOUNTER — Other Ambulatory Visit: Payer: Self-pay | Admitting: Family Medicine

## 2015-02-22 ENCOUNTER — Other Ambulatory Visit: Payer: Self-pay | Admitting: Family Medicine

## 2015-02-22 NOTE — Telephone Encounter (Signed)
plz phone in. 

## 2015-02-22 NOTE — Telephone Encounter (Signed)
Ok to refill 

## 2015-02-23 NOTE — Telephone Encounter (Signed)
[facility] Sharen HonesGutierrez approval.

## 2015-03-15 ENCOUNTER — Other Ambulatory Visit: Payer: Self-pay | Admitting: Family Medicine

## 2015-03-17 ENCOUNTER — Other Ambulatory Visit: Payer: Self-pay | Admitting: Family Medicine

## 2015-03-19 ENCOUNTER — Other Ambulatory Visit: Payer: Self-pay | Admitting: Family Medicine

## 2015-03-20 ENCOUNTER — Other Ambulatory Visit: Payer: Self-pay | Admitting: Family Medicine

## 2015-03-22 ENCOUNTER — Other Ambulatory Visit: Payer: Self-pay | Admitting: Family Medicine

## 2015-03-26 ENCOUNTER — Emergency Department (HOSPITAL_COMMUNITY)
Admission: EM | Admit: 2015-03-26 | Discharge: 2015-03-26 | Disposition: A | Discharge status: Home / Self Care | Payer: Medicare Other | Source: Home / Self Care | Attending: Family Medicine | Admitting: Family Medicine

## 2015-03-26 ENCOUNTER — Emergency Department (INDEPENDENT_AMBULATORY_CARE_PROVIDER_SITE_OTHER): Payer: Medicare Other

## 2015-03-26 ENCOUNTER — Encounter (HOSPITAL_COMMUNITY): Payer: Self-pay | Admitting: Emergency Medicine

## 2015-03-26 DIAGNOSIS — S8001XA Contusion of right knee, initial encounter: Secondary | ICD-10-CM

## 2015-03-26 DIAGNOSIS — M25561 Pain in right knee: Secondary | ICD-10-CM | POA: Diagnosis not present

## 2015-03-26 DIAGNOSIS — S8991XA Unspecified injury of right lower leg, initial encounter: Secondary | ICD-10-CM | POA: Diagnosis not present

## 2015-03-26 DIAGNOSIS — M25461 Effusion, right knee: Secondary | ICD-10-CM | POA: Diagnosis not present

## 2015-03-26 MED ORDER — HYDROCODONE-ACETAMINOPHEN 5-325 MG PO TABS: 1.0000 | ORAL_TABLET | Freq: Four times a day (QID) | ORAL | Status: DC | PRN

## 2015-03-26 NOTE — ED Notes (Signed)
Walking can present upon arrival.

## 2015-03-26 NOTE — ED Provider Notes (Signed)
Bobby Mimesonald B Kucher Sr. is a 69 y.o. male who presents to Urgent Care today for knee pain. Patient tripped and fell yesterday landing on concrete with his anterior knees bilaterally. His right knee is worse than his left. He notes significant anterior knee pain and swelling. No radiating pain weakness or numbness. He has tried Aspercreme, liniment, abscesses, and Advil.   Past Medical History  Diagnosis Date  . HTN (hypertension)   . HLD (hyperlipidemia)   . DMII (diabetes mellitus, type 2)   . GERD (gastroesophageal reflux disease)   . PUD (peptic ulcer disease)   . Tobacco abuse   . Hx-TIA (transient ischemic attack) 2009  . Chronic low back pain     [facility]  . Psychosexual dysfunction with inhibited sexual excitement   . Carotid stenosis     s/p R CEA - Dr. Hart RochesterLawson  . Osteoarthritis   . Solitary pulmonary nodule     solitary pulmonary nodule (enlarged 2011)-follow up - stable on 2012 CT [name]  . Embolism and thrombosis of unspecified site   . Diverticulosis   . 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 Inguinal hernia   . Atherosclerosis of arteries 02/2013    severe stenosis of SMA, celiac, moderate bilateral common iliac stenosis  . History of TIA (transient ischemic attack)   . Left wrist fracture 01/2013    evidence of old scaphoid fracture with scapholunate advanced collapse (SLAC) - declined ortho referral  . Myocardial infarction 2007    ?   Bobby Henry. Stroke    Past Surgical History  Procedure Laterality Date  . Nose surgery  1982  . Neck surgery  1984  . Knee surgery Bilateral 1997    menisectomy  . Carotid endarterectomy Right 5/09    with dacron patch angioplasty with resection of redundant carotid artery with primary reanastomosis  . Esophagogastroduodenoscopy  2007    deformed pylorus; gastric ulcer  . Gastrojejunostomy  4/09    laparoscopic; and high selective anterior and  posterior vagotomies  . Back surgery    . Cervical spine surgery    . Colonoscopy  2008    diverticulosis (Dr. Bosie ClosSchooler) repeat 2018  . Cardiac catheterization  13  . Inguinal hernia repair Right 03/17/2013    Procedure: HERNIA REPAIR INGUINAL ADULT;  Surgeon: Lodema PilotBrian Layton, DO;  Location: [facility] OR;  Service: General;  Laterality: Right;  . Insertion of mesh Right 03/17/2013    Procedure: INSERTION OF MESH ;  Surgeon: Lodema PilotBrian Layton, DO;  Location: [facility] OR;  Service: General;  Laterality: Right;  . Spine surgery      Pinched nerve   History  Substance Use Topics  . Smoking status: Light Tobacco Smoker -- 0.25 packs/day for 33 years    Types: Cigarettes  . Smokeless tobacco: Never Used  . Alcohol Use: No   ROS as above Medications: No current facility-administered medications for this encounter.   Current Outpatient Prescriptions  Medication Sig Dispense Refill  . amLODipine (NORVASC) 10 MG tablet Take one tablet daily **MUST HAVE FOLLOW UP FOR FURTHER REFILLS** 30 tablet 0  . aspirin EC 81 MG tablet Take 81 mg by mouth daily.    Bobby Henry. atorvastatin (LIPITOR) 40 MG tablet Take one tablet every day **MUST HAVE FOLLOW UP FOR FURTHER REFILLS** 90 tablet 0  . enalapril-hydrochlorothiazide (VASERETIC) 10-25 MG per tablet TAKE 1 TABLET TWICE DAILY 60 tablet 6  . gabapentin (NEURONTIN) 300  MG capsule TAKE 1 CAPSULE FOUR TIMES A DAY 120 capsule 6  . glipiZIDE (GLUCOTROL) 5 MG tablet Take 1 tablet (5 mg total) by mouth 2 (two) times daily before a meal. 60 tablet 6  . HYDROcodone-acetaminophen (NORCO/VICODIN) 5-325 MG per tablet Take 1 tablet by mouth every 6 (six) hours as needed. 15 tablet 0  . metFORMIN (GLUCOPHAGE-XR) 750 MG 24 hr tablet Take one tablet daily with breakfast. **MUST HAVE FOLLOW UP FOR FURTHER REFILLS** 30 tablet 0  . metoprolol (LOPRESSOR) 50 MG tablet TAKE 1.5 TABLETS (75 MG TOTAL) BY MOUTH 2 (TWO) TIMES DAILY. 90 tablet 3  . omeprazole (PRILOSEC OTC) 20 MG tablet Take 20 mg by mouth  daily.     . traMADol (ULTRAM) 50 MG tablet TAKE 1 TABLET TWICE A DAY AS NEEDED 60 tablet 0   Allergies  Allergen Reactions  . Varenicline Tartrate Other (See Comments)    REACTION: personality change     Exam:  BP 121/78 mmHg  Pulse 68  Temp(Src) 98.1 F (36.7 C) (Oral)  Resp 18  SpO2 96% Gen: Well NAD Knees bilaterally are with mild effusion. Right worse than left. Right knee tender to palpation anterior aspect of the knee. Range of motion bilaterally 5-120. Stable ligamentous exam. Pulses capillary refill sensation intact distally.  No results found for this or any previous visit (from the past 24 hour(s)). Dg Knee Complete 4 Views Right  03/26/2015   CLINICAL DATA:  Fall onto right knee 1 day ago, pain and soft tissue swelling anteriorly, initial encounter.  EXAM: RIGHT KNEE - COMPLETE 4+ VIEW  COMPARISON:  MR left knee 10/11/2008.  FINDINGS: Near complete loss of joint space height in the lateral compartment with associated subchondral sclerosis and osteophytosis. Valgus deformity at the knee joint. Chondrocalcinosis. Medial and patellofemoral osteophytosis. Rounded lucencies in the distal femur and proximal tibia are likely degenerative. No definite acute fracture. Small to moderate joint effusion. Infrapatellar soft tissue swelling.  IMPRESSION: 1. Small to moderate joint effusion.  No definite fracture. 2. Advanced tricompartment osteoarthritis, worst laterally.   Electronically Signed   By: Leanna Battles M.D.   On: 03/26/2015 18:23    Assessment and Plan: 69 y.o. male with knee pain right worse than left. Due to new contusion on pre-existing severe osteoarthritis. Discussed options. Treat with small amount of hydrocodone for a few days. Follow-up with PCP. Consider referral to orthopedics for consideration of knee replacement due to severe osteoarthritis.  Discussed warning signs or symptoms. Please see discharge instructions. Patient expresses understanding.     Bobby Bong, MD 03/26/15 2021

## 2015-03-26 NOTE — Discharge Instructions (Signed)
Thank you for coming in today.   Contusion A contusion is a deep bruise. Contusions happen when an injury causes bleeding under the skin. Signs of bruising include pain, puffiness (swelling), and discolored skin. The contusion may turn blue, purple, or yellow. HOME CARE   Put ice on the injured area.  Put ice in a plastic bag.  Place a towel between your skin and the bag.  Leave the ice on for 15-20 minutes, 03-04 times a day.  Only take medicine as told by your doctor.  Rest the injured area.  If possible, raise (elevate) the injured area to lessen puffiness. GET HELP RIGHT AWAY IF:   You have more bruising or puffiness.  You have pain that is getting worse.  Your puffiness or pain is not helped by medicine. MAKE SURE YOU:   Understand these instructions.  Will watch your condition.  Will get help right away if you are not doing well or get worse. Document Released: 05/28/2008 Document Revised: 03/03/2012 Document Reviewed: 10/15/2011 [facility] Patient Information 2015 [employer] [place]. This information is not intended to replace advice given to you by your health care provider. Make sure you discuss any questions you have with your health care provider.

## 2015-03-26 NOTE — ED Notes (Signed)
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

## 2015-04-01 ENCOUNTER — Ambulatory Visit (INDEPENDENT_AMBULATORY_CARE_PROVIDER_SITE_OTHER): Payer: Medicare Other | Admitting: Family Medicine

## 2015-04-01 ENCOUNTER — Ambulatory Visit (INDEPENDENT_AMBULATORY_CARE_PROVIDER_SITE_OTHER)
Admission: RE | Admit: 2015-04-01 | Discharge: 2015-04-01 | Disposition: A | Discharge status: Home / Self Care | Payer: Medicare Other | Source: Ambulatory Visit | Attending: Family Medicine | Admitting: Family Medicine

## 2015-04-01 ENCOUNTER — Encounter: Payer: Self-pay | Admitting: Family Medicine

## 2015-04-01 VITALS — BP 130/76 | HR 68 | Temp 97.8°F | Wt 137.2 lb

## 2015-04-01 DIAGNOSIS — M1711 Unilateral primary osteoarthritis, right knee: Secondary | ICD-10-CM | POA: Diagnosis not present

## 2015-04-01 DIAGNOSIS — I10 Essential (primary) hypertension: Secondary | ICD-10-CM | POA: Diagnosis not present

## 2015-04-01 DIAGNOSIS — Z720 Tobacco use: Secondary | ICD-10-CM

## 2015-04-01 DIAGNOSIS — E7800 Pure hypercholesterolemia, unspecified: Secondary | ICD-10-CM

## 2015-04-01 DIAGNOSIS — G894 Chronic pain syndrome: Secondary | ICD-10-CM

## 2015-04-01 DIAGNOSIS — M961 Postlaminectomy syndrome, not elsewhere classified: Secondary | ICD-10-CM

## 2015-04-01 DIAGNOSIS — F17200 Nicotine dependence, unspecified, uncomplicated: Secondary | ICD-10-CM

## 2015-04-01 DIAGNOSIS — E118 Type 2 diabetes mellitus with unspecified complications: Secondary | ICD-10-CM

## 2015-04-01 DIAGNOSIS — J449 Chronic obstructive pulmonary disease, unspecified: Secondary | ICD-10-CM

## 2015-04-01 DIAGNOSIS — K219 Gastro-esophageal reflux disease without esophagitis: Secondary | ICD-10-CM

## 2015-04-01 DIAGNOSIS — M25561 Pain in right knee: Secondary | ICD-10-CM

## 2015-04-01 MED ORDER — METFORMIN HCL ER 750 MG PO TB24: ORAL_TABLET | ORAL | Status: DC

## 2015-04-01 MED ORDER — ATORVASTATIN CALCIUM 40 MG PO TABS: ORAL_TABLET | ORAL | Status: DC

## 2015-04-01 MED ORDER — HYDROCODONE-ACETAMINOPHEN 10-325 MG PO TABS: 1.0000 | ORAL_TABLET | Freq: Two times a day (BID) | ORAL | Status: DC | PRN

## 2015-04-01 MED ORDER — GABAPENTIN 300 MG PO CAPS: ORAL_CAPSULE | ORAL | Status: DC

## 2015-04-01 MED ORDER — GLIPIZIDE 5 MG PO TABS: 5.0000 mg | ORAL_TABLET | Freq: Two times a day (BID) | ORAL | Status: DC

## 2015-04-01 MED ORDER — ENALAPRIL-HYDROCHLOROTHIAZIDE 10-25 MG PO TABS: ORAL_TABLET | ORAL | Status: DC

## 2015-04-01 MED ORDER — AMLODIPINE BESYLATE 10 MG PO TABS: ORAL_TABLET | ORAL | Status: DC

## 2015-04-01 NOTE — Progress Notes (Signed)
BP 130/76 mmHg  Pulse 68  Temp(Src) 97.8 F (36.6 C) (Oral)  Wt 137 lb 4 oz (62.256 kg)   CC: 80mo f/u visit  Subjective:    Patient ID: Bobby Mimesonald B Morrish Sr., male    DOB: 31-Oct-1946, 69 y.o.   MRN: [id number]  HPI: Bobby MimesRonald B Rinke Sr. is a 69 y.o. male presenting on 04/01/2015 for Follow-up   Not seen since 07/2014 - at that time I asked him to f/u in 1 mo. He did not follow up as requested. Wife also had scheduled appointment today but "I couldn't get her to come"  [facility]. Significant knee arthritis on xray. Known severe knee osteoarthritis. H/o bilat knee meniscectomies 1990s. [facility], but declines referral today as he is not ready to consider surgery.  Chronic pain - chronic back pain s/p multiple surgeries. Dismiss[facility] 2/2 repeated inappropriately negative UDS's and running out early on meds. Unable to afford other 2 pain clinics when referred. Tramadol not controlling pain. [facility]  [facility] and pt states not controlling pain.   DM - regularly does check sugars but doesn't know any recent readings.  Compliant with antihyperglycemic regimen which includes: glipizide and metformin.  Denies low sugars or hypoglycemic symptoms.  Denies paresthesias. Last diabetic eye exam 12/2014.  Pneumovax: 2012.  Prevnar: 2015. Lab Results  Component Value Date   HGBA1C 6.3* 04/01/2015   Diabetic Foot Exam - Simple   Simple Foot Form  Diabetic Foot exam was performed with the following findings:  Yes 04/01/2015  3:19 PM  Visual Inspection  See comments:  Yes  Sensation Testing  Intact to touch and monofilament testing bilaterally:  Yes  Pulse Check  See comments:  Yes  Comments  Diminished pulses bilaterally Elongated nails      HTN - Compliant with current antihypertensive regimen of amlodipine 10mg  and enalapril hctz bid and metformin 75mg  bid.  Does check  blood pressures at home but doesn't know numbers.  Denies low blood pressure readings or symptoms of dizziness/syncope.  Denies HA, vision changes, CP/tightness, SOB, leg swelling.   Smoking - 1/2 ppd. Wife quit smoking after her recent heart attack.  Presumed COPD - chronic smoker. Not on any inhalers. Contemplative.  Relevant past medical, surgical, family and social history reviewed and updated as indicated. Interim medical history since our last visit reviewed. Allergies and medications reviewed and updated. Current Outpatient Prescriptions on File Prior to Visit  Medication Sig  . aspirin EC 81 MG tablet Take 81 mg by mouth daily.  . metoprolol (LOPRESSOR) 50 MG tablet TAKE 1.5 TABLETS (75 MG TOTAL) BY MOUTH 2 (TWO) TIMES DAILY.  [name]. omeprazole (PRILOSEC OTC) 20 MG tablet Take 20 mg by mouth daily.    No current facility-administered medications on file prior to visit.    Review of Systems Per HPI unless specifically indicated above     Objective:    BP 130/76 mmHg  Pulse 68  Temp(Src) 97.8 F (36.6 C) (Oral)  Wt 137 lb 4 oz (62.256 kg)  Wt Readings from Last 3 Encounters:  04/01/15 137 lb 4 oz (62.256 kg)  09/28/14 140 lb (63.504 kg)  08/13/14 144 lb (65.318 kg)    Physical Exam  Constitutional: He appears well-developed and well-nourished. No distress.  Thin, disheveled  HENT:  Head: Normocephalic and atraumatic.  Right Ear: External ear normal.  Left Ear:  External ear normal.  Nose: Nose normal.  Mouth/Throat: Oropharynx is clear and moist. No oropharyngeal exudate.  Eyes: Conjunctivae and EOM are normal. Pupils are equal, round, and reactive to light. No scleral icterus.  Neck: Normal range of motion. Neck supple. No thyromegaly present.  Cardiovascular: Normal rate, regular rhythm, normal heart sounds and intact distal pulses.   No murmur heard. Pulmonary/Chest: Effort normal and breath sounds normal. No respiratory distress. He has no wheezes. He has no rales.    coarse  Musculoskeletal: He exhibits no edema.  See HPI for foot exam if done Bilateral knees with crepitus and swelling. No popliteal fullness.   Lymphadenopathy:    He has no cervical adenopathy.  Skin: Skin is warm and dry. No rash noted.  Psychiatric: He has a normal mood and affect.  Nursing note and vitals reviewed.  Results for orders placed or performed in visit on 04/01/15  Basic metabolic panel  Result Value Ref Range   Sodium 136 135 - 145 mEq/L   Potassium 4.1 3.5 - 5.3 mEq/L   Chloride 96 96 - 112 mEq/L   CO2 28 19 - 32 mEq/L   Glucose, Bld 92 70 - 99 mg/dL   BUN 26 (H) 6 - 23 mg/dL   Creat 0.98 1.19 - 1.47 mg/dL   Calcium 9.9 8.4 - 82.9 mg/dL  Hemoglobin F6O  Result Value Ref Range   Hgb A1c MFr Bld 6.3 (H) <5.7 %   Mean Plasma Glucose 134 (H) <117 mg/dL  LDL Cholesterol, Direct  Result Value Ref Range   Direct LDL 77 mg/dL  HM DIABETES EYE EXAM  Result Value Ref Range   HM Diabetic Eye Exam No Retinopathy No Retinopathy   CHEST 2 VIEW COMPARISON: 03/11/2013 and prior chest radiographs dating back to 11/20/2004  FINDINGS: The cardiomediastinal silhouette is stable with unchanged mild hilar prominence. Right basilar scarring is again noted. There is no evidence of focal airspace disease, pulmonary edema, suspicious pulmonary nodule/mass, pleural effusion, or pneumothorax. No acute bony abnormalities are identified.  IMPRESSION: No active cardiopulmonary disease. Electronically Signed  By: Harmon Pier M.D.  On: 04/01/2015 15:59    Assessment & Plan:   Problem List Items Addressed This Visit    TOBACCO DEPENDENCE    Continue to encourage smoking cessation.      Relevant Orders   DG Chest 2 View (Completed)   Right knee pain    After fall. Reviewed [facility] note. Known severe arthritis and meniscectomy in past. Pt declines ortho referral for now. Will trial hydrocodone 10/325mg  for pain control.      OA (osteoarthritis) of knee   Relevant  Medications   HYDROCODONE-ACETAMINOPHEN 10-325 MG PO TABS   Lumbar postlaminectomy syndrome    Start hydrocodone 10/325mg  #60. See above.      HYPERTENSION, BENIGN SYSTEMIC    Chronic, stable. Continue regimen.      Relevant Medications   amLODIpine (NORVASC) tablet   atorvastatin (LIPITOR) tablet   enalapril-hydrochlorothiazide (VASERETIC) 10-25 MG per tablet   Other Relevant Orders   Basic metabolic panel (Completed)   HYPERCHOLESTEROLEMIA    Continue atorvastatin  daily. Check dLDL today.      Relevant Medications   amLODIpine (NORVASC) tablet   atorvastatin (LIPITOR) tablet   enalapril-hydrochlorothiazide (VASERETIC) 10-25 MG per tablet   Other Relevant Orders   LDL Cholesterol, Direct (Completed)   GASTROESOPHAGEAL REFLUX, NO ESOPHAGITIS    Continue omeprazole.      Diabetes mellitus type 2, controlled, with complications - Primary  Check A1c today. H/o well controlled diabetes on metformin  XR and glipizide  bid.      Relevant Medications   atorvastatin (LIPITOR) tablet   enalapril-hydrochlorothiazide (VASERETIC) 10-25 MG per tablet   glipiZIDE (GLUCOTROL) tablet   metFORMIN (GLUCOPHAGE-XR) 24 hr tablet   Other Relevant Orders   Hemoglobin A1c (Completed)   COLD (chronic obstructive lung disease)    Presumed dx from long smoking history - will need spirometry. Continue to encourage cessation. Weight loss noted - update xray today.      Relevant Orders   DG Chest 2 View (Completed)   Chronic pain syndrome    Persistent chronic pain. Will fill out controlled substance agreement for today and UDS, then I will start prescribing hydrocodone 10/325mg  #60 per month. Discussed expectations if we are to take over pain regimen.  Pt will need UDS and agreement filled out at next visit if not already done this year.          Follow up plan: Return in about 6 months (around 10/01/2015), or as needed, for annual exam, prior fasting for blood work.

## 2015-04-01 NOTE — Progress Notes (Signed)
Pre visit review using our clinic review tool, if applicable. No additional management support is needed unless otherwise documented below in the visit note. 

## 2015-04-01 NOTE — Patient Instructions (Addendum)
meds refilled today. labwork today. Return in 3-6 months for physical Let me know when you're ready for ortho referral to see surgeon about knees.  Schedule appointment for your wife.

## 2015-04-02 ENCOUNTER — Encounter: Payer: Self-pay | Admitting: Family Medicine

## 2015-04-02 DIAGNOSIS — J449 Chronic obstructive pulmonary disease, unspecified: Secondary | ICD-10-CM | POA: Insufficient documentation

## 2015-04-02 LAB — BASIC METABOLIC PANEL
BUN: 26 mg/dL — ABNORMAL HIGH (ref 6–23)
CO2: 28 mEq/L (ref 19–32)
CREATININE: 1.12 mg/dL (ref 0.50–1.35)
Calcium: 9.9 mg/dL (ref 8.4–10.5)
Chloride: 96 mEq/L (ref 96–112)
GLUCOSE: 92 mg/dL (ref 70–99)
Potassium: 4.1 mEq/L (ref 3.5–5.3)
Sodium: 136 mEq/L (ref 135–145)

## 2015-04-02 LAB — HEMOGLOBIN A1C
Hgb A1c MFr Bld: 6.3 % — ABNORMAL HIGH (ref <5.7)
Mean Plasma Glucose: 134 mg/dL — ABNORMAL HIGH (ref <117)

## 2015-04-02 LAB — LDL CHOLESTEROL, DIRECT: LDL DIRECT: 77 mg/dL

## 2015-04-02 NOTE — Assessment & Plan Note (Signed)
Start hydrocodone 10/325mg  #60. See above.

## 2015-04-02 NOTE — Assessment & Plan Note (Signed)
Persistent chronic pain. Will fill out controlled substance agreement for today and UDS, then I will start prescribing hydrocodone 10/325mg  #60 per month. Discussed expectations if we are to take over pain regimen.  Pt will need UDS and agreement filled out at next visit if not already done this year.

## 2015-04-02 NOTE — Assessment & Plan Note (Addendum)
Continue atorvastatin 40mg  daily. Check dLDL today.

## 2015-04-02 NOTE — Assessment & Plan Note (Signed)
After fall. Reviewed [facility] note. Known severe arthritis and meniscectomy in past. Pt declines ortho referral for now. Will trial hydrocodone 10/325mg  for pain control.

## 2015-04-02 NOTE — Assessment & Plan Note (Addendum)
Presumed dx from long smoking history - will need spirometry. Continue to encourage cessation. Weight loss noted - update xray today.

## 2015-04-02 NOTE — Assessment & Plan Note (Signed)
Continue omeprazole 

## 2015-04-02 NOTE — Assessment & Plan Note (Signed)
Chronic, stable. Continue regimen. 

## 2015-04-02 NOTE — Assessment & Plan Note (Signed)
Continue to encourage smoking cessation. 

## 2015-04-02 NOTE — Assessment & Plan Note (Signed)
Check A1c today. H/o well controlled diabetes on metformin 750mg  XR and glipizide 5mg  bid.

## 2015-04-24 DIAGNOSIS — R892 Abnormal level of other drugs, medicaments and biological substances in specimens from other organs, systems and tissues: Secondary | ICD-10-CM | POA: Insufficient documentation

## 2015-04-24 HISTORY — DX: Abnormal level of other drugs, medicaments and biological substances in specimens from other organs, systems and tissues: R89.2

## 2015-04-25 ENCOUNTER — Telehealth: Payer: Self-pay | Admitting: *Deleted

## 2015-04-25 NOTE — Telephone Encounter (Signed)
Patient left a voicemail requesting a refill on Hydrocodone. Last refill 04/01/15 #60. Call when ready for pickup.

## 2015-04-26 MED ORDER — HYDROCODONE-ACETAMINOPHEN 10-325 MG PO TABS: 1.0000 | ORAL_TABLET | Freq: Two times a day (BID) | ORAL | Status: DC | PRN

## 2015-04-26 NOTE — Telephone Encounter (Signed)
Patient notified and Rx placed up front for pick up. 

## 2015-04-26 NOTE — Telephone Encounter (Signed)
Will need UDS/controlled substance agreement.

## 2015-04-27 ENCOUNTER — Encounter: Payer: Self-pay | Admitting: Family Medicine

## 2015-04-27 DIAGNOSIS — Z79891 Long term (current) use of opiate analgesic: Secondary | ICD-10-CM | POA: Diagnosis not present

## 2015-04-27 NOTE — Telephone Encounter (Addendum)
Christy from [facility] left v/m requesting cb at [phone] and wanting cb why rx is being refilled one week early. Last filled # 60 on 04/08/15 per [facility]. Please advise.

## 2015-04-27 NOTE — Telephone Encounter (Signed)
He picked up the prescription from our office on 4/8 so I thought that was when it was filled. Will need to wait a full month to refill. plz notify christy.

## 2015-04-28 NOTE — Telephone Encounter (Signed)
Christy notified and said that patient told her that he was out of medication. I advised he still needed to wait for refill and that if he was using more than prescribed or if he was having an increase in pain, then he needed to come in for eval. She said she would let him know.

## 2015-05-06 ENCOUNTER — Emergency Department (HOSPITAL_COMMUNITY)
Admission: EM | Admit: 2015-05-06 | Discharge: 2015-05-06 | Discharge status: Absent without leave | Payer: Self-pay | Source: Home / Self Care

## 2015-05-06 ENCOUNTER — Emergency Department (HOSPITAL_COMMUNITY)
Admission: EM | Admit: 2015-05-06 | Discharge: 2015-05-06 | Discharge status: Absent without leave | Payer: Medicare Other | Source: Home / Self Care

## 2015-05-31 ENCOUNTER — Other Ambulatory Visit: Payer: Self-pay | Admitting: Family Medicine

## 2015-06-13 ENCOUNTER — Other Ambulatory Visit: Payer: Self-pay | Admitting: Family Medicine

## 2015-06-21 ENCOUNTER — Other Ambulatory Visit: Payer: Self-pay

## 2015-06-21 NOTE — Telephone Encounter (Signed)
Pt left v/m requesting rx hydrocodone apap. Call when ready for pick up. rx last printed # 60 on 04/26/15 and last seen on 04/01/15.

## 2015-06-23 ENCOUNTER — Encounter: Payer: Self-pay | Admitting: Family Medicine

## 2015-06-23 DIAGNOSIS — Z79891 Long term (current) use of opiate analgesic: Secondary | ICD-10-CM | POA: Diagnosis not present

## 2015-06-23 MED ORDER — HYDROCODONE-ACETAMINOPHEN 10-325 MG PO TABS: 1.0000 | ORAL_TABLET | Freq: Two times a day (BID) | ORAL | Status: DC | PRN

## 2015-06-23 NOTE — Telephone Encounter (Signed)
Printed and in [name]'s box. Pt will need UDS if not already done (i don't see in chart)

## 2015-06-23 NOTE — Telephone Encounter (Signed)
Patient's wife notified and Rx placed up front for pick up. 

## 2015-07-11 ENCOUNTER — Telehealth: Payer: Self-pay

## 2015-07-11 NOTE — Telephone Encounter (Signed)
Pt left v/m returning a call; left v/m requesting pt to call [facility].

## 2015-07-13 NOTE — Telephone Encounter (Signed)
Pt left v/m requesting cb; left v/m requesting pt to cb. 

## 2015-07-13 NOTE — Telephone Encounter (Signed)
Pt called back and pt said few days ago received a call from doctors office and spoke with pts wife and said they would cb to speak with pt. Pt is not sure of name of person that called. Do not see note in pts chart and pt will wait for cb from whoever called initially.

## 2015-07-22 ENCOUNTER — Other Ambulatory Visit: Payer: Self-pay

## 2015-07-22 NOTE — Telephone Encounter (Signed)
Pt left v/m requesting rx hydrocodone apap. Call when ready for pick up. rx last printed # 60 on 06/23/15. Pt last seen on 04/01/2015. Pt said cannot pick up until next week.

## 2015-07-25 MED ORDER — HYDROCODONE-ACETAMINOPHEN 10-325 MG PO TABS: 1.0000 | ORAL_TABLET | Freq: Two times a day (BID) | ORAL | Status: DC | PRN

## 2015-07-25 NOTE — Telephone Encounter (Signed)
Patient notified and Rx placed up front for pick up. 

## 2015-07-25 NOTE — Telephone Encounter (Signed)
Printed and in [name]'s box 

## 2015-08-08 ENCOUNTER — Ambulatory Visit: Payer: Medicare Other | Admitting: Family Medicine

## 2015-08-14 ENCOUNTER — Encounter: Payer: Self-pay | Admitting: Family Medicine

## 2015-08-23 ENCOUNTER — Encounter: Payer: Self-pay | Admitting: Family Medicine

## 2015-08-30 ENCOUNTER — Ambulatory Visit (INDEPENDENT_AMBULATORY_CARE_PROVIDER_SITE_OTHER): Payer: Medicare Other | Admitting: Family Medicine

## 2015-08-30 ENCOUNTER — Ambulatory Visit (INDEPENDENT_AMBULATORY_CARE_PROVIDER_SITE_OTHER)
Admission: RE | Admit: 2015-08-30 | Discharge: 2015-08-30 | Disposition: A | Discharge status: Home / Self Care | Payer: Medicare Other | Source: Ambulatory Visit | Attending: Family Medicine | Admitting: Family Medicine

## 2015-08-30 ENCOUNTER — Encounter: Payer: Self-pay | Admitting: Family Medicine

## 2015-08-30 VITALS — BP 120/72 | HR 64 | Temp 98.4°F | Wt 136.0 lb

## 2015-08-30 DIAGNOSIS — M25461 Effusion, right knee: Secondary | ICD-10-CM | POA: Diagnosis not present

## 2015-08-30 DIAGNOSIS — M25561 Pain in right knee: Secondary | ICD-10-CM

## 2015-08-30 DIAGNOSIS — M25569 Pain in unspecified knee: Secondary | ICD-10-CM | POA: Insufficient documentation

## 2015-08-30 MED ORDER — HYDROCODONE-ACETAMINOPHEN 10-325 MG PO TABS: 1.0000 | ORAL_TABLET | Freq: Two times a day (BID) | ORAL | Status: DC | PRN

## 2015-08-30 NOTE — Progress Notes (Signed)
Pre visit review using our clinic review tool, if applicable. No additional management support is needed unless otherwise documented below in the visit note.  Recently fell.  Was at home.  Had gone upstairs to check on mother in law.  Slipped, fell back down the stairs.  No LOC.  Scraped his R forehead and L elbow.  Hit but didn't bruise R knee.  Family put him in bed after the event.  Can bear weight in the meantime.  Pain in R anterior skin with walking.  Has normal ROM B knees but audible crepitus noted (at baseline).   He has a h/o mult falls, d/w pt about PT for gait training.  He'll consider.    Meds, vitals, and allergies reviewed.   ROS: See HPI.  Otherwise, noncontributory.  nad ncat except for small superficial scrape on the R upper forehead, no lac seen.  L elbow with small superficial scrape, no lac seen. Normal ROM L elbow and not ttp R knee with chronic OA change, no erythema or bruising but audible and palpable crepitus on ROM.  Normal ROM, ACL MCL LCL feel solid.  Patella slightly ttp Calf and shin not ttp Able to bear weight.  Walking with a cane, baseline.

## 2015-08-30 NOTE — Patient Instructions (Signed)
Ice your knee and keep taking your regular meds.  I would encourage you to go to PT to work on your balance.   Take care.  Glad to see you.

## 2015-08-30 NOTE — Assessment & Plan Note (Signed)
Imaging d/w pt, I reviewed and I agreed with chronic changes w/o acute process.  Okay to continue hydrocodone for now.  rx done, given to patient.  He'll consider PT, I encouraged him.  Fall risk d/w pt.  He'll likely need ortho eval for his knee either sooner or later, depending on his level of function and home considerations (caring for mother in law). Still okay for outpatient f/u.

## 2015-08-31 ENCOUNTER — Ambulatory Visit: Payer: Medicare Other | Admitting: Family Medicine

## 2015-09-14 ENCOUNTER — Telehealth: Payer: Self-pay

## 2015-09-14 DIAGNOSIS — M25569 Pain in unspecified knee: Secondary | ICD-10-CM

## 2015-09-14 NOTE — Telephone Encounter (Signed)
Patient notified as instructed by telephone and verbalized understanding. Patient stated that he does want the referral and has seen [facility], but can not remember his name. Advised patient that ongoing pain medication would need to come from his PCP per Dr. Para March and he agreed. Advised patient that one of the referral coordinators will be in touch to get this set up for him

## 2015-09-14 NOTE — Telephone Encounter (Signed)
rec re eval in office unless Dr Para March recommends anything different.

## 2015-09-14 NOTE — Telephone Encounter (Signed)
Pt left vm; hydrocodone apap 10/325 mg taking one tab bid without relief of pain. Pt wants stronger pain med for rt knee pain; pt does not want to see ortho because pt is taking care of pts wife in the home.pt [facility]/15[facility] [place]. Pt request cb.

## 2015-09-14 NOTE — Telephone Encounter (Signed)
He has known severe disease in his knee.   There isn't anything stronger can call in.  He'd have to pick it up at the office.  That aside, he is already taking the equivalent of  of morphine a day.  If his pain isn't controlled, then we need to see about getting him over to ortho.  Changing his meds at this point will likely not do much other than inc the chance of side effects from med.  I'm glad to refer him.

## 2015-09-14 NOTE — Telephone Encounter (Signed)
I put in the referral.  Thanks. Routed to Dr. Reece Agar as a FYI.

## 2015-09-16 ENCOUNTER — Other Ambulatory Visit: Payer: Self-pay

## 2015-09-16 NOTE — Telephone Encounter (Signed)
Pt left v/m requesting rx hydrocodone apap. Call when ready for pick up. Pt last seen and rx printed # 60 on 08/30/15. ? Early refill.

## 2015-09-18 ENCOUNTER — Other Ambulatory Visit: Payer: Self-pay | Admitting: Family Medicine

## 2015-09-18 DIAGNOSIS — I10 Essential (primary) hypertension: Secondary | ICD-10-CM

## 2015-09-18 DIAGNOSIS — M1A9XX1 Chronic gout, unspecified, with tophus (tophi): Secondary | ICD-10-CM

## 2015-09-18 DIAGNOSIS — Z125 Encounter for screening for malignant neoplasm of prostate: Secondary | ICD-10-CM

## 2015-09-18 DIAGNOSIS — E118 Type 2 diabetes mellitus with unspecified complications: Secondary | ICD-10-CM

## 2015-09-18 DIAGNOSIS — E7800 Pure hypercholesterolemia, unspecified: Secondary | ICD-10-CM

## 2015-09-19 MED ORDER — HYDROCODONE-ACETAMINOPHEN 10-325 MG PO TABS: 1.0000 | ORAL_TABLET | Freq: Two times a day (BID) | ORAL | Status: DC | PRN

## 2015-09-19 NOTE — Telephone Encounter (Signed)
Printed and in [name]'s box. Remind we don't want him taking more than 2 per day, recommend start with plain extra strength tylenol and use narcotic for breakthrough pain.

## 2015-09-20 NOTE — Telephone Encounter (Signed)
Patient notified and verbalized understanding. Rx placed up front for pick up.  

## 2015-09-23 ENCOUNTER — Other Ambulatory Visit: Payer: Medicare Other

## 2015-09-27 ENCOUNTER — Encounter: Payer: Medicare Other | Admitting: Family Medicine

## 2015-10-03 ENCOUNTER — Other Ambulatory Visit: Payer: Self-pay | Admitting: Family Medicine

## 2015-10-04 ENCOUNTER — Encounter (HOSPITAL_COMMUNITY): Payer: Medicare Other

## 2015-10-04 ENCOUNTER — Ambulatory Visit: Payer: Medicare Other | Admitting: Family

## 2015-10-13 ENCOUNTER — Other Ambulatory Visit: Payer: Self-pay | Admitting: Family Medicine

## 2015-12-09 ENCOUNTER — Encounter: Payer: Self-pay | Admitting: Family

## 2015-12-16 ENCOUNTER — Inpatient Hospital Stay (HOSPITAL_COMMUNITY): Admission: RE | Admit: 2015-12-16 | Payer: Medicare Other | Source: Ambulatory Visit

## 2015-12-16 ENCOUNTER — Ambulatory Visit: Payer: Medicare Other | Admitting: Family

## 2016-01-23 ENCOUNTER — Other Ambulatory Visit: Payer: Self-pay | Admitting: *Deleted

## 2016-01-23 MED ORDER — METFORMIN HCL ER 750 MG PO TB24: ORAL_TABLET | ORAL | Status: DC

## 2016-01-31 ENCOUNTER — Encounter: Payer: Self-pay | Admitting: Family

## 2016-02-06 ENCOUNTER — Encounter (HOSPITAL_COMMUNITY): Payer: Medicare Other

## 2016-02-06 ENCOUNTER — Ambulatory Visit: Payer: Medicare Other | Admitting: Family

## 2016-02-15 ENCOUNTER — Other Ambulatory Visit: Payer: Self-pay | Admitting: Family Medicine

## 2016-04-02 ENCOUNTER — Other Ambulatory Visit: Payer: Self-pay | Admitting: Family Medicine

## 2016-04-09 ENCOUNTER — Other Ambulatory Visit: Payer: Self-pay | Admitting: Family Medicine

## 2016-04-17 ENCOUNTER — Other Ambulatory Visit: Payer: Self-pay | Admitting: Family Medicine

## 2016-05-09 ENCOUNTER — Other Ambulatory Visit: Payer: Self-pay | Admitting: Family Medicine

## 2016-05-11 ENCOUNTER — Ambulatory Visit (INDEPENDENT_AMBULATORY_CARE_PROVIDER_SITE_OTHER): Payer: PPO

## 2016-05-11 VITALS — BP 118/68 | HR 68 | Temp 98.5°F | Ht 65.5 in | Wt 133.8 lb

## 2016-05-11 DIAGNOSIS — Z1159 Encounter for screening for other viral diseases: Secondary | ICD-10-CM | POA: Diagnosis not present

## 2016-05-11 DIAGNOSIS — M1A9XX1 Chronic gout, unspecified, with tophus (tophi): Secondary | ICD-10-CM

## 2016-05-11 DIAGNOSIS — I10 Essential (primary) hypertension: Secondary | ICD-10-CM | POA: Diagnosis not present

## 2016-05-11 DIAGNOSIS — E118 Type 2 diabetes mellitus with unspecified complications: Secondary | ICD-10-CM | POA: Diagnosis not present

## 2016-05-11 DIAGNOSIS — Z125 Encounter for screening for malignant neoplasm of prostate: Secondary | ICD-10-CM

## 2016-05-11 DIAGNOSIS — E7800 Pure hypercholesterolemia, unspecified: Secondary | ICD-10-CM

## 2016-05-11 DIAGNOSIS — M109 Gout, unspecified: Secondary | ICD-10-CM

## 2016-05-11 DIAGNOSIS — Z0000 Encounter for general adult medical examination without abnormal findings: Secondary | ICD-10-CM

## 2016-05-11 LAB — RENAL FUNCTION PANEL
ALBUMIN: 4.4 g/dL (ref 3.5–5.2)
BUN: 29 mg/dL — AB (ref 6–23)
CALCIUM: 9.8 mg/dL (ref 8.4–10.5)
CO2: 29 mEq/L (ref 19–32)
Chloride: 104 mEq/L (ref 96–112)
Creatinine, Ser: 1.05 mg/dL (ref 0.40–1.50)
GFR: 74.22 mL/min (ref >60.00)
GLUCOSE: 79 mg/dL (ref 70–99)
Phosphorus: 3.1 mg/dL (ref 2.3–4.6)
Potassium: 4.4 mEq/L (ref 3.5–5.1)
SODIUM: 140 meq/L (ref 135–145)

## 2016-05-11 LAB — CBC WITH DIFFERENTIAL/PLATELET
BASOS PCT: 0.5 % (ref 0.0–3.0)
Basophils Absolute: 0.1 10*3/uL (ref 0.0–0.1)
EOS ABS: 0.3 10*3/uL (ref 0.0–0.7)
Eosinophils Relative: 2.3 % (ref 0.0–5.0)
HCT: 43.8 % (ref 39.0–52.0)
HEMOGLOBIN: 14.7 g/dL (ref 13.0–17.0)
LYMPHS ABS: 1.8 10*3/uL (ref 0.7–4.0)
Lymphocytes Relative: 12.8 % (ref 12.0–46.0)
MCHC: 33.6 g/dL (ref 30.0–36.0)
MCV: 92 fl (ref 78.0–100.0)
MONO ABS: 1.2 10*3/uL — AB (ref 0.1–1.0)
Monocytes Relative: 8.5 % (ref 3.0–12.0)
NEUTROS PCT: 75.9 % (ref 43.0–77.0)
Neutro Abs: 10.8 10*3/uL — ABNORMAL HIGH (ref 1.4–7.7)
Platelets: 317 10*3/uL (ref 150.0–400.0)
RBC: 4.76 Mil/uL (ref 4.22–5.81)
RDW: 13.7 % (ref 11.5–15.5)
WBC: 14.3 10*3/uL — AB (ref 4.0–10.5)

## 2016-05-11 LAB — LIPID PANEL
CHOL/HDL RATIO: 4
Cholesterol: 137 mg/dL (ref 0–200)
HDL: 33.1 mg/dL — ABNORMAL LOW (ref >39.00)
LDL Cholesterol: 75 mg/dL (ref 0–99)
NonHDL: 104.09
Triglycerides: 145 mg/dL (ref 0.0–149.0)
VLDL: 29 mg/dL (ref 0.0–40.0)

## 2016-05-11 LAB — PSA, MEDICARE: PSA: 0.55 ng/mL (ref 0.10–4.00)

## 2016-05-11 LAB — HEMOGLOBIN A1C: HEMOGLOBIN A1C: 6.3 % (ref 4.6–6.5)

## 2016-05-11 LAB — URIC ACID: URIC ACID, SERUM: 6 mg/dL (ref 4.0–7.8)

## 2016-05-11 NOTE — Progress Notes (Signed)
Pre visit review using our clinic review tool, if applicable. No additional management support is needed unless otherwise documented below in the visit note. 

## 2016-05-11 NOTE — Patient Instructions (Addendum)
Bobby Henry , Thank you for taking time to come for your Medicare Wellness Visit. I appreciate your ongoing commitment to your health goals. Please review the following plan we discussed and let me know if I can assist you in the future.   These are the goals we discussed: Goals    When financially feasible, I will schedule a diabetic eye appt in 2017.       This is a list of the screening recommended for you and due dates:  Health Maintenance  Topic Date Due  . Complete foot exam   05/14/2016*  . Eye exam for diabetics  12/23/2016*  . Shingles Vaccine  05/11/2017*  . Flu Shot  07/24/2016  . Hemoglobin A1C  11/11/2016  . Colon Cancer Screening  02/21/2017  . Tetanus Vaccine  09/21/2020  . DTaP/Tdap/Td vaccine  Completed  .  Hepatitis C: One time screening is recommended by Center for Disease Control  (CDC) for  adults born from [birth year] through 1965.   Completed  . Pneumonia vaccines  Completed  *Topic was postponed. The date shown is not the original due date.           Preventive Care for Adults  A healthy lifestyle and preventive care can promote health and wellness. Preventive health guidelines for adults include the following key practices.  . A routine yearly physical is a good way to check with your health care provider about your health and preventive screening. It is a chance to share any concerns and updates on your health and to receive a thorough exam.  . Visit your dentist for a routine exam and preventive care every 6 months. Brush your teeth twice a day and floss once a day. Good oral hygiene prevents tooth decay and gum disease.  . The frequency of eye exams is based on your age, health, family medical history, use  of contact lenses, and other factors. Follow your health care provider's ecommendations for frequency of eye exams.  . Eat a healthy diet. Foods like vegetables, fruits, whole grains, low-fat dairy products, and lean protein foods contain the  nutrients you need without too many calories. Decrease your intake of foods high in solid fats, added sugars, and salt. Eat the right amount of calories for you. Get information about a proper diet from your health care provider, if necessary.  . Regular physical exercise is one of the most important things you can do for your health. Most adults should get at least 150 minutes of moderate-intensity exercise (any activity that increases your heart rate and causes you to sweat) each week. In addition, most adults need muscle-strengthening exercises on 2 or more days a week.  Silver Sneakers may be a benefit available to you. To determine eligibility, you may visit the website: www.silversneakers.com or contact program at 678-532-8106 Mon-Fri between 8AM-8PM.   . Maintain a healthy weight. The body mass index (BMI) is a screening tool to identify possible weight problems. It provides an estimate of body fat based on height and weight. Your health care provider can find your BMI and can help you achieve or maintain a healthy weight.   For adults 20 years and older: ? A BMI below 18.5 is considered underweight. ? A BMI of 18.5 to 24.9 is normal. ? A BMI of 25 to 29.9 is considered overweight. ? A BMI of 30 and above is considered obese.   . Maintain normal blood lipids and cholesterol levels by exercising and minimizing your intake  of saturated fat. Eat a balanced diet with plenty of fruit and vegetables. Blood tests for lipids and cholesterol should begin at age 70 and be repeated every 5 years. If your lipid or cholesterol levels are high, you are over 50, or you are at high risk for heart disease, you may need your cholesterol levels checked more frequently. Ongoing high lipid and cholesterol levels should be treated with medicines if diet and exercise are not working.  . If you smoke, find out from your health care provider how to quit. If you do not use tobacco, please do not start.  . If you  choose to drink alcohol, please do not consume more than 2 drinks per day. One drink is considered to be 12 ounces (355 mL) of beer, 5 ounces (148 mL) of wine, or 1.5 ounces (44 mL) of liquor.  . If you are 3155-70 years old, ask your health care provider if you should take aspirin to prevent strokes.  . Use sunscreen. Apply sunscreen liberally and repeatedly throughout the day. You should seek shade when your shadow is shorter than you. Protect yourself by wearing long sleeves, pants, a wide-brimmed hat, and sunglasses year round, whenever you are outdoors.  . Once a month, do a whole body skin exam, using a mirror to look at the skin on your back. Tell your health care provider of new moles, moles that have irregular borders, moles that are larger than a pencil eraser, or moles that have changed in shape or color.     Fall Prevention in the Home  Falls can cause injuries. They can happen to people of all ages. There are many things you can do to make your home safe and to help prevent falls.  WHAT CAN I DO ON THE OUTSIDE OF MY HOME?  Regularly fix the edges of walkways and driveways and fix any cracks.  Remove anything that might make you trip as you walk through a door, such as a raised step or threshold.  Trim any bushes or trees on the path to your home.  Use bright outdoor lighting.  Clear any walking paths of anything that might make someone trip, such as rocks or tools.  Regularly check to see if handrails are loose or broken. Make sure that both sides of any steps have handrails.  Any raised decks and porches should have guardrails on the edges.  Have any leaves, snow, or ice cleared regularly.  Use sand or salt on walking paths during winter.  Clean up any spills in your garage right away. This includes oil or grease spills. WHAT CAN I DO IN THE BATHROOM?   Use night lights.  Install grab bars by the toilet and in the tub and shower. Do not use towel bars as grab  bars.  Use non-skid mats or decals in the tub or shower.  If you need to sit down in the shower, use a plastic, non-slip stool.  Keep the floor dry. Clean up any water that spills on the floor as soon as it happens.  Remove soap buildup in the tub or shower regularly.  Attach bath mats securely with double-sided non-slip rug tape.  Do not have throw rugs and other things on the floor that can make you trip. WHAT CAN I DO IN THE BEDROOM?  Use night lights.  Make sure that you have a light by your bed that is easy to reach.  Do not use any sheets or blankets that are  too big for your bed. They should not hang down onto the floor.  Have a firm chair that has side arms. You can use this for support while you get dressed.  Do not have throw rugs and other things on the floor that can make you trip. WHAT CAN I DO IN THE KITCHEN?  Clean up any spills right away.  Avoid walking on wet floors.  Keep items that you use a lot in easy-to-reach places.  If you need to reach something above you, use a strong step stool that has a grab bar.  Keep electrical cords out of the way.  Do not use floor polish or wax that makes floors slippery. If you must use wax, use non-skid floor wax.  Do not have throw rugs and other things on the floor that can make you trip. WHAT CAN I DO WITH MY STAIRS?  Do not leave any items on the stairs.  Make sure that there are handrails on both sides of the stairs and use them. Fix handrails that are broken or loose. Make sure that handrails are as long as the stairways.  Check any carpeting to make sure that it is firmly attached to the stairs. Fix any carpet that is loose or worn.  Avoid having throw rugs at the top or bottom of the stairs. If you do have throw rugs, attach them to the floor with carpet tape.  Make sure that you have a light switch at the top of the stairs and the bottom of the stairs. If you do not have them, ask someone to add them for  you. WHAT ELSE CAN I DO TO HELP PREVENT FALLS?  Wear shoes that:  Do not have high heels.  Have rubber bottoms.  Are comfortable and fit you well.  Are closed at the toe. Do not wear sandals.  If you use a stepladder:  Make sure that it is fully opened. Do not climb a closed stepladder.  Make sure that both sides of the stepladder are locked into place.  Ask someone to hold it for you, if possible.  Clearly mark and make sure that you can see:  Any grab bars or handrails.  First and last steps.  Where the edge of each step is.  Use tools that help you move around (mobility aids) if they are needed. These include:  Canes.  Walkers.  Scooters.  Crutches.  Turn on the lights when you go into a dark area. Replace any light bulbs as soon as they burn out.  Set up your furniture so you have a clear path. Avoid moving your furniture around.  If any of your floors are uneven, fix them.  If there are any pets around you, be aware of where they are.  Review your medicines with your doctor. Some medicines can make you feel dizzy. This can increase your chance of falling. Ask your doctor what other things that you can do to help prevent falls.   This information is not intended to replace advice given to you by your health care provider. Make sure you discuss any questions you have with your health care provider.   Document Released: 10/06/2009 Document Revised: 04/26/2015 Document Reviewed: 01/14/2015 Elsevier Interactive Patient Education [employer]

## 2016-05-12 LAB — HEPATITIS C ANTIBODY: HCV Ab: NEGATIVE

## 2016-05-14 ENCOUNTER — Ambulatory Visit (INDEPENDENT_AMBULATORY_CARE_PROVIDER_SITE_OTHER): Payer: PPO | Admitting: Family Medicine

## 2016-05-14 ENCOUNTER — Encounter: Payer: Self-pay | Admitting: Family Medicine

## 2016-05-14 VITALS — BP 120/70 | HR 60 | Temp 97.7°F | Ht 65.5 in | Wt 132.0 lb

## 2016-05-14 DIAGNOSIS — E7800 Pure hypercholesterolemia, unspecified: Secondary | ICD-10-CM | POA: Diagnosis not present

## 2016-05-14 DIAGNOSIS — M1711 Unilateral primary osteoarthritis, right knee: Secondary | ICD-10-CM

## 2016-05-14 DIAGNOSIS — M150 Primary generalized (osteo)arthritis: Secondary | ICD-10-CM

## 2016-05-14 DIAGNOSIS — M961 Postlaminectomy syndrome, not elsewhere classified: Secondary | ICD-10-CM

## 2016-05-14 DIAGNOSIS — F17200 Nicotine dependence, unspecified, uncomplicated: Secondary | ICD-10-CM

## 2016-05-14 DIAGNOSIS — G894 Chronic pain syndrome: Secondary | ICD-10-CM

## 2016-05-14 DIAGNOSIS — Z7189 Other specified counseling: Secondary | ICD-10-CM | POA: Diagnosis not present

## 2016-05-14 DIAGNOSIS — M109 Gout, unspecified: Secondary | ICD-10-CM

## 2016-05-14 DIAGNOSIS — M1A9XX1 Chronic gout, unspecified, with tophus (tophi): Secondary | ICD-10-CM

## 2016-05-14 DIAGNOSIS — J449 Chronic obstructive pulmonary disease, unspecified: Secondary | ICD-10-CM

## 2016-05-14 DIAGNOSIS — I10 Essential (primary) hypertension: Secondary | ICD-10-CM

## 2016-05-14 DIAGNOSIS — M159 Polyosteoarthritis, unspecified: Secondary | ICD-10-CM

## 2016-05-14 DIAGNOSIS — Z0000 Encounter for general adult medical examination without abnormal findings: Secondary | ICD-10-CM | POA: Diagnosis not present

## 2016-05-14 DIAGNOSIS — E118 Type 2 diabetes mellitus with unspecified complications: Secondary | ICD-10-CM | POA: Diagnosis not present

## 2016-05-14 DIAGNOSIS — I6523 Occlusion and stenosis of bilateral carotid arteries: Secondary | ICD-10-CM

## 2016-05-14 MED ORDER — METOPROLOL TARTRATE 50 MG PO TABS: 75.0000 mg | ORAL_TABLET | Freq: Two times a day (BID) | ORAL | Status: AC

## 2016-05-14 MED ORDER — ATORVASTATIN CALCIUM 40 MG PO TABS: 40.0000 mg | ORAL_TABLET | Freq: Every day | ORAL | Status: AC

## 2016-05-14 MED ORDER — AMLODIPINE BESYLATE 10 MG PO TABS: ORAL_TABLET | ORAL | Status: AC

## 2016-05-14 MED ORDER — GLIPIZIDE 5 MG PO TABS
5.0000 mg | ORAL_TABLET | Freq: Every day | ORAL | Status: AC
Start: 2016-05-14 — End: ?

## 2016-05-14 MED ORDER — ENALAPRIL-HYDROCHLOROTHIAZIDE 10-25 MG PO TABS: 1.0000 | ORAL_TABLET | Freq: Two times a day (BID) | ORAL | Status: AC

## 2016-05-14 MED ORDER — ACETAMINOPHEN-ASPIRIN BUFFERED 250-250 MG PO TABS: 1.0000 | ORAL_TABLET | Freq: Four times a day (QID) | ORAL | Status: AC | PRN

## 2016-05-14 MED ORDER — HYDROCODONE-ACETAMINOPHEN 10-325 MG PO TABS: 1.0000 | ORAL_TABLET | Freq: Two times a day (BID) | ORAL | Status: DC | PRN

## 2016-05-14 MED ORDER — METFORMIN HCL ER 750 MG PO TB24: ORAL_TABLET | ORAL | Status: AC

## 2016-05-14 NOTE — Assessment & Plan Note (Addendum)
Continue to encourage cessation. 

## 2016-05-14 NOTE — Assessment & Plan Note (Signed)
Urate stable. 

## 2016-05-14 NOTE — Assessment & Plan Note (Signed)
Pt will call to schedule overdue f/u with [facility]

## 2016-05-14 NOTE — Assessment & Plan Note (Signed)
Chronic, stable. Continue metformin and glipizide, decrease to once daily dosing to avoid hypoglycemia with A1c 6.3%.

## 2016-05-14 NOTE — Assessment & Plan Note (Signed)
Pt declines surgery.

## 2016-05-14 NOTE — Assessment & Plan Note (Signed)
Refilled hydrocodone. Update UDS today.

## 2016-05-14 NOTE — Assessment & Plan Note (Signed)
Preventative protocols reviewed and updated unless pt declined. Discussed healthy diet and lifestyle.  

## 2016-05-14 NOTE — Assessment & Plan Note (Signed)
Hydrocodone refilled. Update UDS today.

## 2016-05-14 NOTE — Progress Notes (Signed)
BP 120/70 mmHg  Pulse 60  Temp(Src) 97.7 F (36.5 C) (Oral)  Ht 5' 5.5" (1.664 m)  Wt 132 lb (59.875 kg)  BMI 21.62 kg/m2   CC: CPE  Subjective:    Patient ID: Bobby Mimes Sr., male    DOB: 27-Jul-1946, 70 y.o.   MRN: [id number]  HPI: Bobby DECOSTE Sr. is a 70 y.o. male presenting on 05/14/2016 for Annual Exam   Saw Virl Axe last week on Friday for AMW, note pending.  2 cars broke down, has not had any transportation. Preacher gave him a spare car. Wife endorses pt gets moody. Pt denies significant depression.   Requests pain meds refilled. Chronic pain in back and knees from known severe osteoarthritis. Since he's been out of pain medication, has been taking excedrin > advil 4 times daily.  Noticing more trouble with short term memory. Has started OTC prevagen  Continued smoker 1/4 ppd.   Fall risk screen - high risk. Recurrent falls down 1 step at home. Has 3 story house.  Depression screen - passed  Preventative: COLONOSCOPY Date: 2008 diverticulosis (Dr. Bosie Clos) repeat 2018 Prostate cancer screening - PSA stable, declines DRE. Lung cancer screening - latest chest CT 2012 - stable RUL nodules likely benign.  Flu shot - yearly Td - 2011 Pneumovax 2012, prevnar 2015 Shingles shot - declines (cost) Advanced directive discussion - states he has this at home. Unsure who HCPOA would be. Doesn't want prolonged life support. Will bring me copy.  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 Interim medical history since our last visit reviewed. Allergies and medications reviewed and updated. Current Outpatient Prescriptions on File Prior to Visit  Medication Sig  .  aspirin EC 81 MG tablet Take 81 mg by mouth daily.  [name] gabapentin (NEURONTIN) 300 MG capsule TAKE 1 CAPSULE FOUR TIMES A DAY  . omeprazole (PRILOSEC OTC) 20 MG tablet Take 20 mg by mouth daily.    No current facility-administered medications on file prior to visit.    Review of Systems  Constitutional: Negative for fever, chills, activity change, appetite change, fatigue and unexpected weight change.  HENT: Negative for hearing loss.   Eyes: Negative for visual disturbance.  Respiratory: Negative for cough, chest tightness, shortness of breath and wheezing.   Cardiovascular: Negative for chest pain, palpitations and leg swelling.  Gastrointestinal: Negative for nausea, vomiting, abdominal pain, diarrhea, constipation, blood in stool and abdominal distention.  Genitourinary: Negative for hematuria and difficulty urinating.  Musculoskeletal: Negative for myalgias, arthralgias and neck pain.  Skin: Negative for rash.  Neurological: Negative for dizziness, seizures, syncope and headaches.       Falls  Hematological: Negative for adenopathy. Bruises/bleeds easily.  Psychiatric/Behavioral: Negative for dysphoric mood. The patient is not nervous/anxious.        Mood swings endorsed by wife   Per HPI unless specifically indicated in ROS section     Objective:    BP 120/70 mmHg  Pulse 60  Temp(Src) 97.7 F (36.5 C) (Oral)  Ht 5' 5.5" (1.664 m)  Wt 132 lb (59.875 kg)  BMI 21.62 kg/m2  Wt Readings from Last 3 Encounters:  05/14/16 132 lb (59.875 kg)  05/11/16 133 lb  12 oz (60.669 kg)  08/30/15 136 lb (61.689 kg)    Physical Exam  Constitutional: He is oriented to person, place, and time. He appears well-developed and well-nourished. No distress.  HENT:  Head: Normocephalic and atraumatic.  Right Ear: Hearing, tympanic membrane, external ear and ear canal normal.  Left Ear: Hearing, tympanic membrane, external ear and ear canal normal.  Nose: Nose normal.  Mouth/Throat: Uvula is  midline, oropharynx is clear and moist and mucous membranes are normal. No oropharyngeal exudate, posterior oropharyngeal edema or posterior oropharyngeal erythema.  Eyes: Conjunctivae and EOM are normal. Pupils are equal, round, and reactive to light. No scleral icterus.  Neck: Normal range of motion. Neck supple. Carotid bruit is not present. No thyromegaly present.  Cardiovascular: Normal rate, regular rhythm, normal heart sounds and intact distal pulses.   No murmur heard. Pulses:      Radial pulses are 2+ on the right side, and 2+ on the left side.  Pulmonary/Chest: Effort normal and breath sounds normal. No respiratory distress. He has no wheezes. He has no rales.  Abdominal: Soft. Bowel sounds are normal. He exhibits no distension and no mass. There is no tenderness. There is no rebound and no guarding.  Musculoskeletal: Normal range of motion. He exhibits no edema.  Lymphadenopathy:    He has no cervical adenopathy.  Neurological: He is alert and oriented to person, place, and time.  CN grossly intact, station and gait intact  Skin: Skin is warm and dry. No rash noted.  Psychiatric: He has a normal mood and affect. His behavior is normal. Judgment and thought content normal.  Nursing note and vitals reviewed.  Results for orders placed or performed in visit on 05/11/16  Hepatitis C Antibody  Result Value Ref Range   HCV Ab NEGATIVE NEGATIVE  Lipid panel  Result Value Ref Range   Cholesterol 137 0 - 200 mg/dL   Triglycerides 161.0 0.0 - 149.0 mg/dL   HDL 96.04 (L) >54.09 mg/dL   VLDL 81.1 0.0 - 91.4 mg/dL   LDL Cholesterol 75 0 - 99 mg/dL   Total CHOL/HDL Ratio 4    NonHDL 104.09   Hemoglobin A1c  Result Value Ref Range   Hgb A1c MFr Bld 6.3 4.6 - 6.5 %  Renal function panel  Result Value Ref Range   Sodium 140 135 - 145 mEq/L   Potassium 4.4 3.5 - 5.1 mEq/L   Chloride 104 96 - 112 mEq/L   CO2 29 19 - 32 mEq/L   Calcium 9.8 8.4 - 10.5 mg/dL   Albumin 4.4 3.5 - 5.2 g/dL    BUN 29 (H) 6 - 23 mg/dL   Creatinine, Ser 7.82 0.40 - 1.50 mg/dL   Glucose, Bld 79 70 - 99 mg/dL   Phosphorus 3.1 2.3 - 4.6 mg/dL   GFR 95.62 >13.08 mL/min  CBC with Differential/Platelet  Result Value Ref Range   WBC 14.3 (H) 4.0 - 10.5 K/uL   RBC 4.76 4.22 - 5.81 Mil/uL   Hemoglobin 14.7 13.0 - 17.0 g/dL   HCT 65.7 84.6 - 96.2 %   MCV 92.0 78.0 - 100.0 fl   MCHC 33.6 30.0 - 36.0 g/dL   RDW 95.2 84.1 - 32.4 %   Platelets 317.0 150.0 - 400.0 K/uL   Neutrophils Relative % 75.9 43.0 - 77.0 %   Lymphocytes Relative 12.8 12.0 - 46.0 %   Monocytes Relative 8.5 3.0 - 12.0 %   Eosinophils Relative 2.3 0.0 - 5.0 %  Basophils Relative 0.5 0.0 - 3.0 %   Neutro Abs 10.8 (H) 1.4 - 7.7 K/uL   Lymphs Abs 1.8 0.7 - 4.0 K/uL   Monocytes Absolute 1.2 (H) 0.1 - 1.0 K/uL   Eosinophils Absolute 0.3 0.0 - 0.7 K/uL   Basophils Absolute 0.1 0.0 - 0.1 K/uL  PSA, Medicare  Result Value Ref Range   PSA 0.55 0.10 - 4.00 ng/ml  Uric acid  Result Value Ref Range   Uric Acid, Serum 6.0 4.0 - 7.8 mg/dL      Assessment & Plan:   Problem List Items Addressed This Visit    Diabetes mellitus type 2, controlled, with complications (HCC)    Chronic, stable. Continue metformin and glipizide, decrease to once daily dosing to avoid hypoglycemia with A1c 6.3%.       Relevant Medications   atorvastatin (LIPITOR) 40 MG tablet   enalapril-hydrochlorothiazide (VASERETIC) 10-25 MG tablet   metFORMIN (GLUCOPHAGE-XR) 750 MG 24 hr tablet   glipiZIDE (GLUCOTROL) 5 MG tablet   HYPERCHOLESTEROLEMIA    Chronic, stable. Continue atorvastatin 40mg  daily.       Relevant Medications   amLODipine (NORVASC) 10 MG tablet   atorvastatin (LIPITOR) 40 MG tablet   enalapril-hydrochlorothiazide (VASERETIC) 10-25 MG tablet   metoprolol (LOPRESSOR) 50 MG tablet   TOBACCO DEPENDENCE    Continue to encourage cessation.       HYPERTENSION, BENIGN SYSTEMIC    Chronic, stable. Continue current regimen.      Relevant  Medications   amLODipine (NORVASC) 10 MG tablet   atorvastatin (LIPITOR) 40 MG tablet   enalapril-hydrochlorothiazide (VASERETIC) 10-25 MG tablet   metoprolol (LOPRESSOR) 50 MG tablet   Osteoarthritis, multiple sites    Refilled hydrocodone. Update UDS today.      Relevant Medications   HYDROcodone-acetaminophen (NORCO) 10-325 MG tablet   Acetaminophen-Aspirin Buffered (EXCEDRIN BACK & BODY) 250-250 MG tablet   Lumbar postlaminectomy syndrome   OA (osteoarthritis) of knee    Pt declines surgery.      Relevant Medications   HYDROcodone-acetaminophen (NORCO) 10-325 MG tablet   Acetaminophen-Aspirin Buffered (EXCEDRIN BACK & BODY) 250-250 MG tablet   Chronic tophaceous gout    Urate stable.       Relevant Medications   HYDROcodone-acetaminophen (NORCO) 10-325 MG tablet   Acetaminophen-Aspirin Buffered (EXCEDRIN BACK & BODY) 250-250 MG tablet   Chronic pain syndrome    Hydrocodone refilled. Update UDS today.      Carotid stenosis    Pt will call to schedule overdue f/u with [facility]      Relevant Medications   amLODipine (NORVASC) 10 MG tablet   atorvastatin (LIPITOR) 40 MG tablet   enalapril-hydrochlorothiazide (VASERETIC) 10-25 MG tablet   metoprolol (LOPRESSOR) 50 MG tablet   COLD (chronic obstructive lung disease) (HCC)    Continued smoker. Consider spirometry.      Health maintenance examination - Primary    Preventative protocols reviewed and updated unless pt declined. Discussed healthy diet and lifestyle.       Advanced care planning/counseling discussion    Advanced directive discussion - states he has this at home. Unsure who HCPOA would be. Doesn't want prolonged life support. Will bring me copy.           Follow up plan: Return in about 6 months (around 11/14/2016), or as needed, for follow up visit.  Eustaquio BoydenJavier Catrina Fellenz, MD

## 2016-05-14 NOTE — Assessment & Plan Note (Signed)
Chronic, stable. Continue atorvastatin 40 mg daily.

## 2016-05-14 NOTE — Progress Notes (Signed)
PCP notes:  Health maintenance:   A1C - completed Foot exam - will be completed at next appt Hep C screening - completed Shingles vaccine - postponed/pt will check with insurance to determine coverage Eye exam - postponed/pt unable to afford at this time  Abnormal screenings:  Fall risk - hx of multiple falls without report of injury Cognitive - Scored 18/20. Unable to complete 3 step command.   Patient concerns: None  Nurse concerns: None  Next PCP appt: 05/14/16 @ 9AM

## 2016-05-14 NOTE — Patient Instructions (Addendum)
Update urine drug screen today.  Meds refilled today, let us know if we need any further refills.  Bring me copy of your living will. We will provide you with advanced directive packet to look at.  Decrease glipizide to 5mg  with breakfast only.  White count was high - return in 1 month for recheck blood work (lab visit only).  Return in 6 months for follow up visit. Call vascular doctors to schedule overdue follow up   Health Maintenance, Male A healthy lifestyle and preventative care can promote health and wellness.  Maintain regular health, dental, and eye exams.  Eat a healthy diet. Foods like vegetables, fruits, whole grains, low-fat dairy products, and lean protein foods contain the nutrients you need and are low in calories. Decrease your intake of foods high in solid fats, added sugars, and salt. Get information about a proper diet from your health care provider, if necessary.  Regular physical exercise is one of the most important things you can do for your health. Most adults should get at least 150 minutes of moderate-intensity exercise (any activity that increases your heart rate and causes you to sweat) each week. In addition, most adults need muscle-strengthening exercises on 2 or more days a week.   Maintain a healthy weight. The body mass index (BMI) is a screening tool to identify possible weight problems. It provides an estimate of body fat based on height and weight. Your health care provider can find your BMI and can help you achieve or maintain a healthy weight. For males 20 years and older:  A BMI below 18.5 is considered underweight.  A BMI of 18.5 to 24.9 is normal.  A BMI of 25 to 29.9 is considered overweight.  A BMI of 30 and above is considered obese.  Maintain normal blood lipids and cholesterol by exercising and minimizing your intake of saturated fat. Eat a balanced diet with plenty of fruits and vegetables. Blood tests for lipids and cholesterol should begin  at age 70 and be repeated every 5 years. If your lipid or cholesterol levels are high, you are over age 450, or you are at high risk for heart disease, you may need your cholesterol levels checked more frequently.Ongoing high lipid and cholesterol levels should be treated with medicines if diet and exercise are not working.  If you smoke, find out from your health care provider how to quit. If you do not use tobacco, do not start.  Lung cancer screening is recommended for adults aged 55-80 years who are at high risk for developing lung cancer because of a history of smoking. A yearly low-dose CT scan of the lungs is recommended for people who have at least a 30-pack-year history of smoking and are current smokers or have quit within the past 15 years. A pack year of smoking is smoking an average of 1 pack of cigarettes a day for 1 year (for example, a 30-pack-year history of smoking could mean smoking 1 pack a day for 30 years or 2 packs a day for 15 years). Yearly screening should continue until the smoker has stopped smoking for at least 15 years. Yearly screening should be stopped for people who develop a health problem that would prevent them from having lung cancer treatment.  If you choose to drink alcohol, do not have more than 2 drinks per day. One drink is considered to be 12 oz (360 mL) of beer, 5 oz (150 mL) of wine, or 1.5 oz (45 mL) of  liquor.  Avoid the use of street drugs. Do not share needles with anyone. Ask for help if you need support or instructions about stopping the use of drugs.  High blood pressure causes heart disease and increases the risk of stroke. High blood pressure is more likely to develop in:  People who have blood pressure in the end of the normal range (100-139/85-89 mm Hg).  People who are overweight or obese.  People who are African American.  If you are 58-56 years of age, have your blood pressure checked every 3-5 years. If you are 79 years of age or older,  have your blood pressure checked every year. You should have your blood pressure measured twice--once when you are at a hospital or clinic, and once when you are not at a hospital or clinic. Record the average of the two measurements. To check your blood pressure when you are not at a hospital or clinic, you can use:  An automated blood pressure machine at a pharmacy.  A home blood pressure monitor.  If you are 6-22 years old, ask your health care provider if you should take aspirin to prevent heart disease.  Diabetes screening involves taking a blood sample to check your fasting blood sugar level. This should be done once every 3 years after age 52 if you are at a normal weight and without risk factors for diabetes. Testing should be considered at a younger age or be carried out more frequently if you are overweight and have at least 1 risk factor for diabetes.  Colorectal cancer can be detected and often prevented. Most routine colorectal cancer screening begins at the age of 50 and continues through age 89. However, your health care provider may recommend screening at an earlier age if you have risk factors for colon cancer. On a yearly basis, your health care provider may provide home test kits to check for hidden blood in the stool. A small camera at the end of a tube may be used to directly examine the colon (sigmoidoscopy or colonoscopy) to detect the earliest forms of colorectal cancer. Talk to your health care provider about this at age 52 when routine screening begins. A direct exam of the colon should be repeated every 5-10 years through age 8, unless early forms of precancerous polyps or small growths are found.  People who are at an increased risk for hepatitis B should be screened for this virus. You are considered at high risk for hepatitis B if:  You were born in a country where hepatitis B occurs often. Talk with your health care provider about which countries are considered high  risk.  Your parents were born in a high-risk country and you have not received a shot to protect against hepatitis B (hepatitis B vaccine).  You have HIV or AIDS.  You use needles to inject street drugs.  You live with, or have sex with, someone who has hepatitis B.  You are a man who has sex with other men (MSM).  You get hemodialysis treatment.  You take certain medicines for conditions like cancer, organ transplantation, and autoimmune conditions.  Hepatitis C blood testing is recommended for all people born from [birth year] through 1965 and any individual with known risk factors for hepatitis C.  Healthy men should no longer receive prostate-specific antigen (PSA) blood tests as part of routine cancer screening. Talk to your health care provider about prostate cancer screening.  Testicular cancer screening is not recommended for adolescents or adult  males who have no symptoms. Screening includes self-exam, a health care provider exam, and other screening tests. Consult with your health care provider about any symptoms you have or any concerns you have about testicular cancer.  Practice safe sex. Use condoms and avoid high-risk sexual practices to reduce the spread of sexually transmitted infections (STIs).  You should be screened for STIs, including gonorrhea and chlamydia if:  You are sexually active and are younger than 24 years.  You are older than 24 years, and your health care provider tells you that you are at risk for this type of infection.  Your sexual activity has changed since you were last screened, and you are at an increased risk for chlamydia or gonorrhea. Ask your health care provider if you are at risk.  If you are at risk of being infected with HIV, it is recommended that you take a prescription medicine daily to prevent HIV infection. This is called pre-exposure prophylaxis (PrEP). You are considered at risk if:  You are a man who has sex with other men (MSM).  You  are a heterosexual man who is sexually active with multiple partners.  You take drugs by injection.  You are sexually active with a partner who has HIV.  Talk with your health care provider about whether you are at high risk of being infected with HIV. If you choose to begin PrEP, you should first be tested for HIV. You should then be tested every 3 months for as long as you are taking PrEP.  Use sunscreen. Apply sunscreen liberally and repeatedly throughout the day. You should seek shade when your shadow is shorter than you. Protect yourself by wearing long sleeves, pants, a wide-brimmed hat, and sunglasses year round whenever you are outdoors.  Tell your health care provider of new moles or changes in moles, especially if there is a change in shape or color. Also, tell your health care provider if a mole is larger than the size of a pencil eraser.  A one-time screening for abdominal aortic aneurysm (AAA) and surgical repair of large AAAs by ultrasound is recommended for men aged 65-75 years who are current or former smokers.  Stay current with your vaccines (immunizations).   This information is not intended to replace advice given to you by your health care provider. Make sure you discuss any questions you have with your health care provider.   Document Released: 06/07/2008 Document Revised: 12/31/2014 Document Reviewed: 05/07/2011 Elsevier Interactive Patient Education [employer]

## 2016-05-14 NOTE — Assessment & Plan Note (Signed)
Continued smoker. Consider spirometry.

## 2016-05-14 NOTE — Progress Notes (Signed)
Subjective:   Bobby DURKIN Sr. is a 70 y.o. male who presents for Medicare Annual/Subsequent preventive examination.  Review of Systems:  N/A Cardiac Risk Factors include: advanced age (>35men, >28 women);dyslipidemia;male gender;smoking/ tobacco exposure     Objective:    Vitals: BP 118/68 mmHg  Pulse 68  Temp(Src) 98.5 F (36.9 C) (Oral)  Ht 5' 5.5" (1.664 m)  Wt 133 lb 12 oz (60.669 kg)  BMI 21.91 kg/m2  SpO2 96%  Body mass index is 21.91 kg/(m^2).  Tobacco History  Smoking status  . Current Every Day Smoker -- 0.25 packs/day for 33 years  . Types: Cigarettes  Smokeless tobacco  . Never Used     Ready to quit: No Counseling given: No   Past Medical History  Diagnosis Date  . HTN (hypertension)   . HLD (hyperlipidemia)   . DMII (diabetes mellitus, type 2) (HCC)   . GERD (gastroesophageal reflux disease)   . PUD (peptic ulcer disease)   . Tobacco abuse   . Hx-TIA (transient ischemic attack) 2009  . Chronic low back pain     [facility]  . Psychosexual dysfunction with inhibited sexual excitement   . Carotid stenosis     s/p R CEA - Dr. Hart [place]  . Osteoarthritis   . Solitary pulmonary nodule     solitary pulmonary nodule (enlarged 2011)-follow up - stable on 2012 CT [facility])  . Embolism and thrombosis of unspecified site   . Diverticulosis   . 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 Inguinal hernia   . Atherosclerosis of arteries 02/2013    severe stenosis of SMA, celiac, moderate bilateral common iliac stenosis  . History of TIA (transient ischemic attack)   . Left wrist fracture 01/2013    evidence of old scaphoid fracture with scapholunate advanced collapse (SLAC) - declined ortho referral  . CAD (coronary artery disease) 2007    h/o silent MI  . CVA (cerebral infarction)   . Abnormal drug screen 04/2015    pos oxycodone/valium and neg norco (04/2015), negative  norco (06/2015)   Past Surgical History  Procedure Laterality Date  . Nose surgery  1982  . Neck surgery  1984  . Knee surgery Bilateral 1997    menisectomy  . Carotid endarterectomy Right 5/09    with dacron patch angioplasty with resection of redundant carotid artery with primary reanastomosis  . Esophagogastroduodenoscopy  2007    deformed pylorus; gastric ulcer  . Gastrojejunostomy  4/09    laparoscopic; and high selective anterior and posterior vagotomies  . Back surgery    . Cervical spine surgery    . Colonoscopy  2008    diverticulosis (Dr. Bosie Clos) repeat 2018  . Cardiac catheterization  13  . Inguinal hernia repair Right 03/17/2013    Procedure: HERNIA REPAIR INGUINAL ADULT;  Surgeon: [name], DO;  Location: [facility] OR;  Service: General;  Laterality: Right;  . Insertion of mesh Right 03/17/2013    Procedure: INSERTION OF MESH ;  Surgeon: [name], DO;  Location: [facility] OR;  Service: General;  Laterality: Right;  . Spine surgery      Pinched nerve   Family History  Problem Relation Age of Onset  . Heart attack Father     CABG 6v  . Arthritis Father   . Hypertension Father   . Alzheimer's disease Mother   . Arthritis Mother   . Hypertension Mother   .  Bipolar disorder Sister   . Bipolar disorder Sister   . Cancer Neg Hx   . Diabetes Neg Hx   . Stroke Neg Hx    History  Sexual Activity  . Sexual Activity: No    Outpatient Encounter Prescriptions as of 05/11/2016  Medication Sig  . aspirin EC 81 MG tablet Take 81 mg by mouth daily.  [name]. gabapentin (NEURONTIN) 300 MG capsule TAKE 1 CAPSULE FOUR TIMES A DAY  . omeprazole (PRILOSEC OTC) 20 MG tablet Take 20 mg by mouth daily.   . [DISCONTINUED] amLODipine (NORVASC) 10 MG tablet TAKE 1 TABLET (10 MG TOTAL) BY MOUTH DAILY. **MUST HAVE PHYSICAL FOR FURTHER REFILLS**  . [DISCONTINUED] atorvastatin (LIPITOR) 40 MG tablet Take 1 tablet (40 mg total) by mouth daily. **MUST HAVE PHYSICAL FOR FURTHER REFILLS**  .  [DISCONTINUED] enalapril-hydrochlorothiazide (VASERETIC) 10-25 MG tablet TAKE 1 TABLET TWICE DAILY  . [DISCONTINUED] glipiZIDE (GLUCOTROL) 5 MG tablet Take one tablet twice a day before a meal. **MUST HAVE PHYSICAL FOR FURTHER REFILLS**  . [DISCONTINUED] HYDROcodone-acetaminophen (NORCO) 10-325 MG per tablet Take 1 tablet by mouth 2 (two) times daily as needed.  . [DISCONTINUED] metFORMIN (GLUCOPHAGE-XR) 750 MG 24 hr tablet Take one tablet daily with breakfast.  . [DISCONTINUED] metoprolol (LOPRESSOR) 50 MG tablet Take 1.5 tablets (75 mg total) by mouth 2 (two) times daily.  . [DISCONTINUED] gabapentin (NEURONTIN) 300 MG capsule TAKE 1 CAPSULE FOUR TIMES A DAY   No facility-administered encounter medications on file as of 05/11/2016.    Activities of Daily Living In your present state of health, do you have any difficulty performing the following activities: 05/13/2016  Hearing? Y  Vision? N  Difficulty concentrating or making decisions? Y  Walking or climbing stairs? N  Dressing or bathing? N  Doing errands, shopping? N  Preparing Food and eating ? N  Using the Toilet? N  In the past six months, have you accidently leaked urine? N  Do you have problems with loss of bowel control? N  Managing your Medications? N  Managing your Finances? N  Housekeeping or managing your Housekeeping? N    Patient Care Team: Eustaquio BoydenJavier Gutierrez, MD as PCP - General   Assessment:     Exercise Activities and Dietary recommendations Current Exercise Habits: Home exercise routine, Type of exercise: walking, Time (Minutes): 25, Frequency (Times/Week): 5, Weekly Exercise (Minutes/Week): 125, Intensity: Mild, Exercise limited by: None identified  Goals    When financially feasible, I will schedule a diabetic eye exam in 2017.      Fall Risk Fall Risk  05/11/2016 02/10/2013  Falls in the past year? Yes Yes  Number falls in past yr: 2 or more 2 or more  Injury with Fall? Yes -  Risk Factor Category  High  Fall Risk High Fall Risk  Risk for fall due to : - History of fall(s);Impaired balance/gait;Impaired mobility;Impaired vision;Medication side effect  Follow up Falls evaluation completed;Education provided;Falls prevention discussed -   Depression Screen PHQ 2/9 Scores 05/11/2016  PHQ - 2 Score 0    Cognitive Testing MMSE - Mini Mental State Exam 05/11/2016  Orientation to time 5  Orientation to Place 5  Registration 3  Attention/ Calculation 0  Recall 3  Language- name 2 objects 0  Language- repeat 1  Language- follow 3 step command 1  Language- follow 3 step command-comments unable to follow 2 steps for clock drawing  Language- read & follow direction 0  Write a sentence 0  Copy design 0  Total score 18   PLEASE NOTE: A Mini-Cog screen was completed. Maximum score is 20. A value of 0 denotes this part of Folstein MMSE was not completed or the patient failed this part of the Mini-Cog screening.   Mini-Cog Screening Orientation to Time - Max 5 pts Orientation to Place - Max 5 pts Registration - Max 3 pts Recall - Max 3 pts Language Repeat - Max 1 pts Language Follow 3 Step Command - Max 3 pts   Immunization History  Administered Date(s) Administered  . Influenza Split 11/12/2011, 01/02/2013  . Influenza Whole 10/08/2007, 10/05/2008, 01/11/2010, 09/21/2010  . Influenza,inj,Quad PF,36+ Mos 10/29/2013, 08/13/2014  . Pneumococcal Conjugate-13 08/13/2014  . Pneumococcal Polysaccharide-23 11/23/2006, 11/12/2011  . Td 08/24/1998, 09/21/2010   Screening Tests Health Maintenance  Topic Date Due  . FOOT EXAM  05/14/2016 (Originally 03/31/2016)  . OPHTHALMOLOGY EXAM  12/23/2016 (Originally 12/25/2015)  . ZOSTAVAX  05/11/2017 (Originally 05/11/2006)  . INFLUENZA VACCINE  07/24/2016  . HEMOGLOBIN A1C  11/11/2016  . COLONOSCOPY  02/21/2017  . TETANUS/TDAP  09/21/2020  . DTaP/Tdap/Td  Completed  . Hepatitis C Screening  Completed  . PNA vac Low Risk Adult  Completed      Plan:       I have personally reviewed and addressed the Medicare Annual Wellness questionnaire and have noted the following in the patient's chart:  A. Medical and social history B. Use of alcohol, tobacco or illicit drugs  C. Current medications and supplements D. Functional ability and status E.  Nutritional status F.  Physical activity G. Advance directives H. List of other physicians I.  Hospitalizations, surgeries, and ER visits in previous 12 months J.  Vitals K. Screenings to include hearing, vision, cognitive, depression L. Referrals and appointments - none  In addition, I have reviewed and discussed with patient certain preventive protocols, quality metrics, and best practice recommendations. A written personalized care plan for preventive services as well as general preventive health recommendations were provided to patient.  See attached scanned questionnaire for additional information.   Signed,   Randa Evens, MHA, BS, LPN Health Advisor

## 2016-05-14 NOTE — Assessment & Plan Note (Signed)
Chronic, stable. Continue current regimen. 

## 2016-05-14 NOTE — Progress Notes (Signed)
Pre visit review using our clinic review tool, if applicable. No additional management support is needed unless otherwise documented below in the visit note. 

## 2016-05-14 NOTE — Assessment & Plan Note (Signed)
Advanced directive discussion - states he has this at home. Unsure who HCPOA would be. Doesn't want prolonged life support. Will bring me copy.

## 2016-05-16 NOTE — Progress Notes (Signed)
I reviewed health advisor's note, was available for consultation, and agree with documentation and plan.  

## 2016-06-15 ENCOUNTER — Other Ambulatory Visit: Payer: Self-pay

## 2016-06-15 NOTE — Telephone Encounter (Signed)
Pt left v/m requesting rx hydrocodone apap. Call when ready for pick up. Pt last seen and rx last printed # 60 on 05/14/16.

## 2016-06-18 MED ORDER — HYDROCODONE-ACETAMINOPHEN 10-325 MG PO TABS: 1.0000 | ORAL_TABLET | Freq: Two times a day (BID) | ORAL | Status: DC | PRN

## 2016-06-18 NOTE — Telephone Encounter (Signed)
Printed and in [name]'s box 

## 2016-06-18 NOTE — Telephone Encounter (Signed)
Patient's wife notified and Rx placed up front for pick up. Did not update UDS at last OV as instructed. Will have him do that when he picks up Rx.

## 2016-06-19 ENCOUNTER — Encounter: Payer: Self-pay | Admitting: Family Medicine

## 2016-06-19 DIAGNOSIS — Z79891 Long term (current) use of opiate analgesic: Secondary | ICD-10-CM | POA: Diagnosis not present

## 2016-06-28 ENCOUNTER — Telehealth: Payer: Self-pay | Admitting: Family Medicine

## 2016-06-28 ENCOUNTER — Other Ambulatory Visit: Payer: Self-pay | Admitting: Family Medicine

## 2016-06-28 ENCOUNTER — Encounter: Payer: Self-pay | Admitting: Family Medicine

## 2016-06-28 DIAGNOSIS — G894 Chronic pain syndrome: Secondary | ICD-10-CM

## 2016-06-28 DIAGNOSIS — R892 Abnormal level of other drugs, medicaments and biological substances in specimens from other organs, systems and tissues: Secondary | ICD-10-CM

## 2016-06-28 NOTE — Telephone Encounter (Signed)
Called patient regarding abnormal UDS - neg norco, positive oxycodone and valium.  No answer. Will try later.

## 2016-07-05 ENCOUNTER — Encounter: Payer: Self-pay | Admitting: Family Medicine

## 2016-07-17 ENCOUNTER — Other Ambulatory Visit: Payer: Self-pay | Admitting: Family Medicine

## 2016-07-19 ENCOUNTER — Other Ambulatory Visit: Payer: Self-pay | Admitting: Family Medicine

## 2016-07-19 NOTE — Telephone Encounter (Signed)
Pt left v/m requesting rx hydrocodone apap. Call when ready for pick up. Spoke with Mrs Poore and pt has never stopped taking the hydrocodone apap. Per hx list noted was stopped by provider.Please advise.Annual exam 05/14/16.

## 2016-07-24 ENCOUNTER — Encounter: Payer: Self-pay | Admitting: Family Medicine

## 2016-07-24 DIAGNOSIS — G8929 Other chronic pain: Secondary | ICD-10-CM | POA: Insufficient documentation

## 2016-07-24 NOTE — Telephone Encounter (Signed)
See prior phone note.  Will not refill given abnormal UDS. May further discuss at next OV.

## 2016-07-24 NOTE — Telephone Encounter (Signed)
Pt left v/m requesting status of hydrocodone apap rx. 

## 2016-07-24 NOTE — Telephone Encounter (Signed)
Spoke with patient, discussed abnormal UDS. He states he did not take benzos. Advised I would not refill hydrocodone, and we can further discuss at next OV.

## 2016-08-06 ENCOUNTER — Ambulatory Visit: Payer: Self-pay

## 2016-08-24 ENCOUNTER — Other Ambulatory Visit: Payer: Self-pay | Admitting: Family Medicine

## 2016-09-20 DIAGNOSIS — M4716 Other spondylosis with myelopathy, lumbar region: Secondary | ICD-10-CM | POA: Diagnosis not present

## 2016-09-24 ENCOUNTER — Other Ambulatory Visit: Payer: Self-pay | Admitting: Neurological Surgery

## 2016-09-24 ENCOUNTER — Other Ambulatory Visit (HOSPITAL_COMMUNITY): Payer: Self-pay | Admitting: Neurological Surgery

## 2016-09-24 DIAGNOSIS — M4716 Other spondylosis with myelopathy, lumbar region: Secondary | ICD-10-CM

## 2016-10-04 ENCOUNTER — Ambulatory Visit (HOSPITAL_COMMUNITY)
Admission: RE | Admit: 2016-10-04 | Discharge: 2016-10-04 | Disposition: A | Discharge status: Home / Self Care | Payer: PPO | Source: Ambulatory Visit | Attending: Neurological Surgery | Admitting: Neurological Surgery

## 2016-10-04 DIAGNOSIS — Z9889 Other specified postprocedural states: Secondary | ICD-10-CM | POA: Diagnosis not present

## 2016-10-04 DIAGNOSIS — M4186 Other forms of scoliosis, lumbar region: Secondary | ICD-10-CM | POA: Diagnosis not present

## 2016-10-04 DIAGNOSIS — M48061 Spinal stenosis, lumbar region without neurogenic claudication: Secondary | ICD-10-CM | POA: Diagnosis not present

## 2016-10-04 DIAGNOSIS — M2578 Osteophyte, vertebrae: Secondary | ICD-10-CM | POA: Diagnosis not present

## 2016-10-04 DIAGNOSIS — I77819 Aortic ectasia, unspecified site: Secondary | ICD-10-CM | POA: Insufficient documentation

## 2016-10-04 DIAGNOSIS — M4804 Spinal stenosis, thoracic region: Secondary | ICD-10-CM | POA: Insufficient documentation

## 2016-10-04 DIAGNOSIS — M545 Low back pain: Secondary | ICD-10-CM | POA: Diagnosis not present

## 2016-10-04 DIAGNOSIS — M4716 Other spondylosis with myelopathy, lumbar region: Secondary | ICD-10-CM | POA: Diagnosis not present

## 2016-10-04 DIAGNOSIS — M4726 Other spondylosis with radiculopathy, lumbar region: Secondary | ICD-10-CM | POA: Diagnosis not present

## 2016-10-04 LAB — GLUCOSE, CAPILLARY: GLUCOSE-CAPILLARY: 123 mg/dL — AB (ref 65–99)

## 2016-10-04 MED ORDER — DIAZEPAM 5 MG PO TABS
10.0000 mg | ORAL_TABLET | Freq: Once | ORAL | Status: AC
  Administered 2016-10-04: 10 mg via ORAL

## 2016-10-04 MED ORDER — IOPAMIDOL (ISOVUE-M 200) INJECTION 41%
20.0000 mL | Freq: Once | INTRAMUSCULAR | Status: AC
  Administered 2016-10-04: 11 mL via INTRATHECAL

## 2016-10-04 MED ORDER — LIDOCAINE HCL 1 % IJ SOLN
INTRAMUSCULAR | Status: AC
  Filled 2016-10-04: qty 10

## 2016-10-04 MED ORDER — ONDANSETRON HCL 4 MG/2ML IJ SOLN: 4.0000 mg | Freq: Four times a day (QID) | INTRAMUSCULAR | Status: DC | PRN

## 2016-10-04 MED ORDER — IOPAMIDOL (ISOVUE-M 200) INJECTION 41%
INTRAMUSCULAR | Status: AC
  Administered 2016-10-04: 11 mL via INTRATHECAL
  Filled 2016-10-04: qty 10

## 2016-10-04 MED ORDER — DIAZEPAM 5 MG PO TABS
ORAL_TABLET | ORAL | Status: AC
  Filled 2016-10-04: qty 2

## 2016-10-04 MED ORDER — LIDOCAINE HCL (PF) 1 % IJ SOLN
10.0000 mL | Freq: Once | INTRAMUSCULAR | Status: AC
  Administered 2016-10-04: 5 mL via INTRADERMAL

## 2016-10-04 MED ORDER — HYDROCODONE-ACETAMINOPHEN 5-325 MG PO TABS
1.0000 | ORAL_TABLET | ORAL | Status: DC | PRN
  Administered 2016-10-04: 1 via ORAL

## 2016-10-04 MED ORDER — HYDROCODONE-ACETAMINOPHEN 5-325 MG PO TABS
ORAL_TABLET | ORAL | Status: AC
  Filled 2016-10-04: qty 1

## 2016-10-04 NOTE — Discharge Instructions (Signed)
Myelography, Care After °These instructions give you information on caring for yourself after your procedure. Your doctor may also give you more specific instructions. Call your doctor if you have any problems or questions after your procedure. °HOME CARE °· Rest the first day. °· When you rest, lie flat, with your head slightly raised (elevated). °· Avoid heavy lifting and activity for 48 hours, or as told by your doctor. °· You may take the bandage (dressing) off one day after the test, or as told by your doctor. °· Take all medicines only as told by your doctor. °· Ask your doctor when it is okay to take a shower or bath. °· Ask your doctor when your test results will be ready and how you can get them. Make sure you follow up and get your results. °· Do not drink alcohol for 24 hours, or as told by your doctor. °· Drink enough fluid to keep your pee (urine) clear or pale yellow. °GET HELP IF:  °· You have a fever. °· You have a headache. °· You feel sick to your stomach (nauseous) or throw up (vomit). °· You have pain or cramping in your belly (abdomen). °GET HELP RIGHT AWAY IF:  °· You have a headache with a stiff neck or fever. °· You have trouble breathing. °· Any of the places where the needles were put in are: °¨ Puffy (swollen) or red. °¨ Sore or hot to the touch. °¨ Draining yellowish-white fluid (pus). °¨ Bleeding. °MAKE SURE YOU: °· Understand these instructions. °· Will watch your condition. °· Will get help right away if you are not doing well or get worse. °  °This information is not intended to replace advice given to you by your health care provider. Make sure you discuss any questions you have with your health care provider. °  °Document Released: 09/18/2008 Document Revised: 12/31/2014 Document Reviewed: 09/03/2012 °Elsevier Interactive Patient Education ©2016 [employer] ° °

## 2016-10-04 NOTE — Procedures (Signed)
[name] is a 70 year old individual who has had significant spondylosis of lumbar spine over the number of years. Had a previous laminectomy in the past. He's had progressive deterioration in his ability to walk and significant increase in back pain over the years. He is seen recently for further workup and plain x-rays demonstrated that he had advanced degenerative spine spondylitic changes at multiple levels from L1-L5 including a degenerative scoliosis because of the nature of his spondylitic disease I advised him myelography with post myelogram CAT scan to further evaluate this process.  Pre op Dx: Lumbar scoliosis with radiculopathy Post op Dx: Lumbar scoliosis with radiculopathy Procedure: Lumbar myelogram Surgeon: Raimundo Corbit Puncture level: L2-3 Fluid color: Clear colorless Injection: Isovue 200. 11 mL Findings: Advanced degenerative spondylosis at multiple levels with mild stenosis T12-L1 and multiple other levels. Further evaluation with CT scanning.

## 2016-10-15 ENCOUNTER — Other Ambulatory Visit: Payer: Self-pay | Admitting: Family Medicine

## 2016-10-15 DIAGNOSIS — M4716 Other spondylosis with myelopathy, lumbar region: Secondary | ICD-10-CM | POA: Diagnosis not present

## 2016-10-30 ENCOUNTER — Other Ambulatory Visit: Payer: Self-pay | Admitting: Family Medicine

## 2016-11-02 DIAGNOSIS — M5416 Radiculopathy, lumbar region: Secondary | ICD-10-CM | POA: Diagnosis not present

## 2016-11-02 DIAGNOSIS — M48061 Spinal stenosis, lumbar region without neurogenic claudication: Secondary | ICD-10-CM | POA: Diagnosis not present

## 2016-11-02 DIAGNOSIS — M4726 Other spondylosis with radiculopathy, lumbar region: Secondary | ICD-10-CM | POA: Diagnosis not present

## 2016-11-02 DIAGNOSIS — M5116 Intervertebral disc disorders with radiculopathy, lumbar region: Secondary | ICD-10-CM | POA: Diagnosis not present

## 2016-11-08 ENCOUNTER — Emergency Department (HOSPITAL_COMMUNITY): Payer: PPO

## 2016-11-08 ENCOUNTER — Emergency Department (HOSPITAL_COMMUNITY)
Admission: EM | Admit: 2016-11-08 | Discharge: 2016-11-08 | Disposition: A | Discharge status: Home / Self Care | Payer: PPO | Attending: Emergency Medicine | Admitting: Emergency Medicine

## 2016-11-08 ENCOUNTER — Encounter (HOSPITAL_COMMUNITY): Payer: Self-pay | Admitting: Emergency Medicine

## 2016-11-08 ENCOUNTER — Telehealth: Payer: Self-pay | Admitting: Family Medicine

## 2016-11-08 DIAGNOSIS — F17203 Nicotine dependence unspecified, with withdrawal: Secondary | ICD-10-CM | POA: Diagnosis not present

## 2016-11-08 DIAGNOSIS — F17210 Nicotine dependence, cigarettes, uncomplicated: Secondary | ICD-10-CM | POA: Insufficient documentation

## 2016-11-08 DIAGNOSIS — Z7982 Long term (current) use of aspirin: Secondary | ICD-10-CM | POA: Insufficient documentation

## 2016-11-08 DIAGNOSIS — F3489 Other specified persistent mood disorders: Secondary | ICD-10-CM | POA: Diagnosis not present

## 2016-11-08 DIAGNOSIS — E119 Type 2 diabetes mellitus without complications: Secondary | ICD-10-CM | POA: Diagnosis not present

## 2016-11-08 DIAGNOSIS — Z7984 Long term (current) use of oral hypoglycemic drugs: Secondary | ICD-10-CM | POA: Diagnosis not present

## 2016-11-08 DIAGNOSIS — I2510 Atherosclerotic heart disease of native coronary artery without angina pectoris: Secondary | ICD-10-CM | POA: Diagnosis not present

## 2016-11-08 DIAGNOSIS — R4182 Altered mental status, unspecified: Secondary | ICD-10-CM | POA: Diagnosis not present

## 2016-11-08 DIAGNOSIS — Z8673 Personal history of transient ischemic attack (TIA), and cerebral infarction without residual deficits: Secondary | ICD-10-CM | POA: Insufficient documentation

## 2016-11-08 DIAGNOSIS — R4586 Emotional lability: Secondary | ICD-10-CM

## 2016-11-08 DIAGNOSIS — F39 Unspecified mood [affective] disorder: Secondary | ICD-10-CM | POA: Diagnosis not present

## 2016-11-08 DIAGNOSIS — R443 Hallucinations, unspecified: Secondary | ICD-10-CM | POA: Diagnosis not present

## 2016-11-08 DIAGNOSIS — R402411 Glasgow coma scale score 13-15, in the field [EMT or ambulance]: Secondary | ICD-10-CM | POA: Diagnosis not present

## 2016-11-08 DIAGNOSIS — F17213 Nicotine dependence, cigarettes, with withdrawal: Secondary | ICD-10-CM | POA: Diagnosis not present

## 2016-11-08 DIAGNOSIS — Z79899 Other long term (current) drug therapy: Secondary | ICD-10-CM | POA: Diagnosis not present

## 2016-11-08 DIAGNOSIS — I10 Essential (primary) hypertension: Secondary | ICD-10-CM | POA: Diagnosis not present

## 2016-11-08 LAB — CBC WITH DIFFERENTIAL/PLATELET
Basophils Absolute: 0 10*3/uL (ref 0.0–0.1)
Basophils Relative: 0 %
EOS ABS: 0.2 10*3/uL (ref 0.0–0.7)
EOS PCT: 2 %
HCT: 41.9 % (ref 39.0–52.0)
Hemoglobin: 14.4 g/dL (ref 13.0–17.0)
LYMPHS ABS: 1.7 10*3/uL (ref 0.7–4.0)
Lymphocytes Relative: 15 %
MCH: 31.4 pg (ref 26.0–34.0)
MCHC: 34.4 g/dL (ref 30.0–36.0)
MCV: 91.5 fL (ref 78.0–100.0)
MONOS PCT: 10 %
Monocytes Absolute: 1.2 10*3/uL — ABNORMAL HIGH (ref 0.1–1.0)
Neutro Abs: 8.7 10*3/uL — ABNORMAL HIGH (ref 1.7–7.7)
Neutrophils Relative %: 73 %
PLATELETS: 192 10*3/uL (ref 150–400)
RBC: 4.58 MIL/uL (ref 4.22–5.81)
RDW: 12.7 % (ref 11.5–15.5)
WBC: 11.8 10*3/uL — ABNORMAL HIGH (ref 4.0–10.5)

## 2016-11-08 LAB — RAPID URINE DRUG SCREEN, HOSP PERFORMED
Amphetamines: NOT DETECTED
BARBITURATES: NOT DETECTED
BENZODIAZEPINES: POSITIVE — AB
COCAINE: NOT DETECTED
OPIATES: NOT DETECTED
Tetrahydrocannabinol: NOT DETECTED

## 2016-11-08 LAB — URINALYSIS, ROUTINE W REFLEX MICROSCOPIC
BILIRUBIN URINE: NEGATIVE
Glucose, UA: NEGATIVE mg/dL
KETONES UR: NEGATIVE mg/dL
LEUKOCYTES UA: NEGATIVE
NITRITE: NEGATIVE
PH: 6 (ref 5.0–8.0)
PROTEIN: NEGATIVE mg/dL
Specific Gravity, Urine: 1.023 (ref 1.005–1.030)

## 2016-11-08 LAB — COMPREHENSIVE METABOLIC PANEL
ALK PHOS: 57 U/L (ref 38–126)
ALT: 18 U/L (ref 17–63)
ANION GAP: 9 (ref 5–15)
AST: 27 U/L (ref 15–41)
Albumin: 3.7 g/dL (ref 3.5–5.0)
BUN: 34 mg/dL — ABNORMAL HIGH (ref 6–20)
CALCIUM: 8.8 mg/dL — AB (ref 8.9–10.3)
CHLORIDE: 106 mmol/L (ref 101–111)
CO2: 23 mmol/L (ref 22–32)
Creatinine, Ser: 0.95 mg/dL (ref 0.61–1.24)
Glucose, Bld: 112 mg/dL — ABNORMAL HIGH (ref 65–99)
Potassium: 4.4 mmol/L (ref 3.5–5.1)
SODIUM: 138 mmol/L (ref 135–145)
Total Bilirubin: 1 mg/dL (ref 0.3–1.2)
Total Protein: 6.1 g/dL — ABNORMAL LOW (ref 6.5–8.1)

## 2016-11-08 LAB — URINE MICROSCOPIC-ADD ON
BACTERIA UA: NONE SEEN
Squamous Epithelial / LPF: NONE SEEN
WBC UA: NONE SEEN WBC/hpf (ref 0–5)

## 2016-11-08 LAB — CBG MONITORING, ED: Glucose-Capillary: 107 mg/dL — ABNORMAL HIGH (ref 65–99)

## 2016-11-08 LAB — ETHANOL: Alcohol, Ethyl (B): <5 mg/dL (ref <5)

## 2016-11-08 NOTE — Discharge Instructions (Signed)
Nicotine withdrawal symptoms may include: Feeling irritable, angry, or anxious Having trouble thinking and sleeping Changes in appetite    Chew 1 piece of gum every 1-2 hours for 6 weeks then chew 1 piece every 2-4 hours for the next 3 weeks and 1 piece every 48 hours for 3 weeks after that. Make an appointment to follow-up with your primary physician on Monday for other medication options for withdrawal symptoms. Return immediately for any worsening of your symptoms, fever, focal weakness, numbness, persistent vomiting or for any concerns.

## 2016-11-08 NOTE — Telephone Encounter (Signed)
Patient Name: Bobby Henry DOB: [birth year] Initial Comment caller states husband fell yesterday going to the mailbox and is now having confusion Nurse Assessment Nurse: Yetta BarreJones, RN, Miranda Date/Time (Eastern Time): 11/08/2016 11:03:39 AM Confirm and document reason for call. If symptomatic, describe symptoms. You must click the next button to save text entered. ---Caller states her husband fell yesterday. Afterwards, he was acting normal and she did not think he was injured. The pt go up during the night and "could not find the bathroom". He is not acting normal and seems confused. Has the patient traveled out of the country within the last 30 days? ---Not Applicable Does the patient have any new or worsening symptoms? ---Yes Will a triage be completed? ---Yes Related visit to physician within the last 2 weeks? ---No Does the PT have any chronic conditions? (i.e. diabetes, asthma, etc.) ---Yes List chronic conditions. ---Chronic back pain, Diabetes, HTN, High Cholesterol Is this a behavioral health or substance abuse call? ---No Guidelines Guideline Title Affirmed Question Affirmed Notes Confusion - Delirium [1] Longstanding confusion (e.g., dementia, stroke) AND [2] worsening Final Disposition User See Physician within 4 Hours (or PCP triage) Yetta BarreJones, RN, Miranda Comments No appt available with PCP or at primary office. Called backline and spoke with Angelica ChessmanMandy, RN team lead. She agreed with outcome and wanted us to strongly encourage caller to take the pt to the ED. Angelica ChessmanMandy states she will speak to the MD and will contact the caller. Referrals GO TO FACILITY UNDECIDED

## 2016-11-08 NOTE — ED Notes (Signed)
ED Provider at bedside. 

## 2016-11-08 NOTE — ED Provider Notes (Signed)
[facility]-EMERGENCY DEPT Provider Note   CSN: [id number] Arrival date & time: 11/08/16  1531     History   Chief Complaint Chief Complaint  Patient presents with  . Altered Mental Status    HPI Bobby GAGO Sr. is a 70 y.o. male.  HPI Patient presents by EMS for altered mental status. States he's been increasingly irritated and easily angered over the last few days. Admits to decreased sleep and decrease appetite. Denies any nausea or vomiting. No fever or chills. 2 weeks ago had myelogram of his lumbar spine. Denies any pain at this point. Upon review of the medical records appears that the patient fell while walking to the mailbox yesterday. His wife called in to his primary provider states he was increasingly confused. She was advised to call EMS trip have patient transported to the emergency department to rule out acute intracranial hemorrhage. Patient denies dysphoric mood, suicidal or homicidal ideations. Denies drug or alcohol use. States he smokes 2-3 packs of cigarettes daily. He quit 2 days ago. He started chewing Nicorette gum today. Past Medical History:  Diagnosis Date  . Abnormal drug screen 04/2015   pos oxycodone/valium and neg norco (04/2015), negative norco (06/2015), pos oxycodone neg norco pos benzos (06/2016)  . Atherosclerosis of arteries 02/2013   severe stenosis of SMA, celiac, moderate bilateral common iliac stenosis  . CAD (coronary artery disease) 2007   h/o silent MI  . Carotid stenosis    s/p R CEA - Dr. Hart [place]  . Chronic low back pain    [facility]  . 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 CVA (cerebral infarction)   . Diverticulosis   . DMII (diabetes mellitus, type 2) (HCC)   . Embolism and thrombosis of unspecified site   . GERD (gastroesophageal reflux disease)   . History of TIA (transient ischemic attack)   . HLD (hyperlipidemia)   . HTN (hypertension)   .  Hx-TIA (transient ischemic attack) 2009  . Inguinal hernia   . Left wrist fracture 01/2013   evidence of old scaphoid fracture with scapholunate advanced collapse (SLAC) - declined ortho referral  . Osteoarthritis   . Psychosexual dysfunction with inhibited sexual excitement   . PUD (peptic ulcer disease)   . Solitary pulmonary nodule    solitary pulmonary nodule (enlarged 2011)-follow up - stable on 2012 CT [facility])  . Tobacco abuse     Patient Active Problem List   Diagnosis Date Noted  . Encounter for chronic pain management 07/24/2016  . Health maintenance examination 05/14/2016  . Advanced care planning/counseling discussion 05/14/2016  . Abnormal drug screen 04/24/2015  . COLD (chronic obstructive lung disease) (HCC) 04/02/2015  . Carotid stenosis 09/28/2014  . Chronic pain syndrome 10/29/2013  . Right inguinal hernia 02/12/2013  . Abdominal bruit 02/12/2013  . Chronic tophaceous gout 01/24/2013  . OA (osteoarthritis) of knee 05/09/2012  . Lumbar postlaminectomy syndrome 03/10/2012  . Lumbar spondylosis 03/10/2012  . NECK MASS 01/08/2011  . SUPERFICIAL VEIN THROMBOSIS 11/15/2010  . PULMONARY NODULE, SOLITARY 01/05/2008  . SEBACEOUS CYST, SCALP 04/21/2007  . Diabetes mellitus type 2, controlled, with complications (HCC) 02/20/2007  . HYPERCHOLESTEROLEMIA 02/20/2007  . 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 TOBACCO DEPENDENCE 02/20/2007  . HYPERTENSION, BENIGN SYSTEMIC 02/20/2007  . GASTROESOPHAGEAL REFLUX, NO ESOPHAGITIS 02/20/2007  . Osteoarthritis, multiple sites 02/20/2007    Past Surgical History:  Procedure Laterality Date  . BACK SURGERY    .  CARDIAC CATHETERIZATION  13  . CAROTID ENDARTERECTOMY Right 5/09   with dacron patch angioplasty with resection of redundant carotid artery with primary reanastomosis  . CERVICAL SPINE SURGERY    . COLONOSCOPY  2008   diverticulosis (Dr. Bosie ClosSchooler) repeat 2018  . ESOPHAGOGASTRODUODENOSCOPY  2007   deformed pylorus; gastric  ulcer  . GASTROJEJUNOSTOMY  4/09   laparoscopic; and high selective anterior and posterior vagotomies  . INGUINAL HERNIA REPAIR Right 03/17/2013   Procedure: HERNIA REPAIR INGUINAL ADULT;  Surgeon: Lodema PilotBrian Layton, DO;  Location: [facility] OR;  Service: General;  Laterality: Right;  . INSERTION OF MESH Right 03/17/2013   Procedure: INSERTION OF MESH ;  Surgeon: Lodema PilotBrian Layton, DO;  Location: [facility] OR;  Service: General;  Laterality: Right;  . KNEE SURGERY Bilateral 1997   menisectomy  . NECK SURGERY  1984  . NOSE SURGERY  1982  . SPINE SURGERY     Pinched nerve       Home Medications    Prior to Admission medications   Medication Sig Start Date End Date Taking? Authorizing Provider  Acetaminophen-Aspirin Buffered (EXCEDRIN BACK & BODY) 250-250 MG tablet Take 1 tablet by mouth every 6 (six) hours as needed for pain. Patient taking differently: Take 2 tablets by mouth every 6 (six) hours as needed for pain.  05/14/16  Yes Eustaquio BoydenJavier Gutierrez, MD  amLODipine (NORVASC) 10 MG tablet TAKE 1 TABLET (10 MG TOTAL) BY MOUTH DAILY 05/14/16  Yes Eustaquio BoydenJavier Gutierrez, MD  [facility](PREVAGEN EXTRA STRENGTH PO) Take 1 capsule by mouth daily.   Yes Historical Provider, MD  atorvastatin (LIPITOR) 40 MG tablet Take 1 tablet (40 mg total) by mouth daily. Patient taking differently: Take 20 mg by mouth daily.  05/14/16  Yes Eustaquio BoydenJavier Gutierrez, MD  Coenzyme Q10 (CO Q 10) 100 MG CAPS Take 100 mg by mouth daily.   Yes Historical Provider, MD  enalapril-hydrochlorothiazide (VASERETIC) 10-25 MG tablet Take 1 tablet by mouth 2 (two) times daily. Patient taking differently: Take 1 tablet by mouth every evening.  05/14/16  Yes Eustaquio BoydenJavier Gutierrez, MD  gabapentin (NEURONTIN) 300 MG capsule TAKE 1 CAPSULE FOUR TIMES A DAY Patient taking differently: TAKE 1 CAPSULE BID TIMES A DAY 10/15/16  Yes Eustaquio BoydenJavier Gutierrez, MD  glipiZIDE (GLUCOTROL) 5 MG tablet Take 1 tablet (5 mg total) by mouth 2 (two) times daily before a meal. 10/15/16  Yes Eustaquio BoydenJavier  Gutierrez, MD  ibuprofen (ADVIL,MOTRIN) 200 MG tablet Take 200 mg by mouth every 6 (six) hours as needed for headache or moderate pain.   Yes Historical Provider, MD  L-Lysine 1000 MG TABS Take 1,000 mg by mouth daily.   Yes Historical Provider, MD  metFORMIN (GLUCOPHAGE-XR) 750 MG 24 hr tablet Take one tablet daily with breakfast. Patient taking differently: Take 750 mg by mouth every evening.  05/14/16  Yes Eustaquio BoydenJavier Gutierrez, MD  metoprolol (LOPRESSOR) 50 MG tablet Take 1.5 tablets (75 mg total) by mouth 2 (two) times daily. Patient taking differently: Take 50 mg by mouth 2 (two) times daily.  05/14/16  Yes Eustaquio BoydenJavier Gutierrez, MD  Multiple Vitamin (ONE-A-DAY MENS PO) Take 1 tablet by mouth daily.   Yes Historical Provider, MD  OMEGA-3 KRILL OIL PO Take 350 mg by mouth daily.   Yes Historical Provider, MD  potassium gluconate 595 (99 K) MG TABS tablet Take 595 mg by mouth daily.   Yes Historical Provider, MD  fexofenadine (ALLEGRA) 180 MG tablet Take 180 mg by mouth daily as needed for allergies.  Historical Provider, MD  glipiZIDE (GLUCOTROL) 5 MG tablet Take 1 tablet (5 mg total) by mouth daily before breakfast. Patient not taking: Reported on 11/08/2016 05/14/16   Eustaquio BoydenJavier Gutierrez, MD  HYDROcodone-acetaminophen (NORCO/VICODIN) 5-325 MG tablet Take 1 tablet by mouth every 6 (six) hours as needed. for pain 10/05/16   Historical Provider, MD  lidocaine (ASPERCREME W/LIDOCAINE) 4 % cream Apply 1 application topically as needed (for pain).    Historical Provider, MD    Family History Family History  Problem Relation Age of Onset  . Heart attack Father     CABG 6v  . Arthritis Father   . Hypertension Father   . Alzheimer's disease Mother   . Arthritis Mother   . Hypertension Mother   . Bipolar disorder Sister   . Bipolar disorder Sister   . Cancer Neg Hx   . Diabetes Neg Hx   . Stroke Neg Hx     Social History Social History  Substance Use Topics  . Smoking status: Current Every Day  Smoker    Packs/day: 0.25    Years: 33.00    Types: Cigarettes  . Smokeless tobacco: Never Used  . Alcohol use No     Allergies   Varenicline tartrate   Review of Systems Review of Systems  Constitutional: Positive for appetite change. Negative for chills and fever.  Respiratory: Negative for shortness of breath.   Cardiovascular: Negative for chest pain.  Gastrointestinal: Negative for abdominal pain, diarrhea, nausea and vomiting.  Genitourinary: Negative for dysuria.  Musculoskeletal: Negative for back pain, myalgias, neck pain and neck stiffness.  Neurological: Negative for dizziness, syncope, weakness, light-headedness and numbness.  Psychiatric/Behavioral: Positive for agitation and confusion. Negative for dysphoric mood and suicidal ideas.  All other systems reviewed and are negative.    Physical Exam Updated Vital Signs BP 141/80 (BP Location: Left Arm)   Pulse 72   Temp 99 F (37.2 C) (Oral)   Resp 11   Ht 5\' 6"  (1.676 m)   Wt 149 lb (67.6 kg)   SpO2 100%   BMI 24.05 kg/m   Physical Exam  Constitutional: He is oriented to person, place, and time. He appears well-developed and well-nourished. No distress.  HENT:  Head: Normocephalic and atraumatic.  Mouth/Throat: Oropharynx is clear and moist.  No evidence of any head injury.  Eyes: EOM are normal. Pupils are equal, round, and reactive to light.  Neck: Normal range of motion. Neck supple.  No posterior midline cervical tenderness to palpation. No meningismus.  Cardiovascular: Normal rate and regular rhythm.   Pulmonary/Chest: Effort normal and breath sounds normal.  Abdominal: Soft. Bowel sounds are normal. There is no tenderness. There is no rebound and no guarding.  Musculoskeletal: Normal range of motion. He exhibits no edema or tenderness.  No midline thoracic or lumbar tenderness. Pelvis is stable. No lower extremity asymmetry, swelling or tenderness. Distal pulses are intact.  Neurological: He is  alert and oriented to person, place, and time.  5/5 motor in all extremities. Sensation is fully intact. Patient does have some mild confusion and difficulty recalling recent events. Unsure of how many packs of cigarettes he smokes daily.  Skin: Skin is warm and dry. Capillary refill takes less than 2 seconds. No rash noted. No erythema.  Psychiatric: He has a normal mood and affect. His behavior is normal.  Nursing note and vitals reviewed.    ED Treatments / Results  Labs (all labs ordered are listed, but only abnormal results are displayed)  Labs Reviewed  CBC WITH DIFFERENTIAL/PLATELET - Abnormal; Notable for the following:       Result Value   WBC 11.8 (*)    Neutro Abs 8.7 (*)    Monocytes Absolute 1.2 (*)    All other components within normal limits  COMPREHENSIVE METABOLIC PANEL - Abnormal; Notable for the following:    Glucose, Bld 112 (*)    BUN 34 (*)    Calcium 8.8 (*)    Total Protein 6.1 (*)    All other components within normal limits  URINALYSIS, ROUTINE W REFLEX MICROSCOPIC (NOT AT [facility]) - Abnormal; Notable for the following:    Hgb urine dipstick SMALL (*)    All other components within normal limits  RAPID URINE DRUG SCREEN, HOSP PERFORMED - Abnormal; Notable for the following:    Benzodiazepines POSITIVE (*)    All other components within normal limits  CBG MONITORING, ED - Abnormal; Notable for the following:    Glucose-Capillary 107 (*)    All other components within normal limits  ETHANOL  URINE MICROSCOPIC-ADD ON    EKG  EKG Interpretation None       Radiology Ct Head Wo Contrast  Result Date: 11/08/2016 CLINICAL DATA:  Mid swings, angular, hallucinations EXAM: CT HEAD WITHOUT CONTRAST TECHNIQUE: Contiguous axial images were obtained from the base of the skull through the vertex without intravenous contrast. COMPARISON:  05/24/2013 FINDINGS: Brain: No evidence of acute infarction, hemorrhage, extra-axial collection, ventriculomegaly, or mass  effect. Generalized cerebral atrophy. Periventricular white matter low attenuation likely secondary to microangiopathy. Vascular: Cerebrovascular atherosclerotic calcifications are noted. Skull: Negative for fracture or focal lesion. Sinuses/Orbits: Visualized portions of the orbits are unremarkable. Visualized portions of the paranasal sinuses and mastoid air cells are unremarkable. Other: None. IMPRESSION: 1. No acute intracranial pathology. 2. Chronic microvascular disease and cerebral atrophy. Electronically Signed   By: Elige Ko   On: 11/08/2016 16:55    Procedures Procedures (including critical care time)  Medications Ordered in ED Medications - No data to display   Initial Impression / Assessment and Plan / ED Course  I have reviewed the triage vital signs and the nursing notes.  Pertinent labs & imaging results that were available during my care of the patient were reviewed by me and considered in my medical decision making (see chart for details).  Clinical Course    CT head without acute intracranial abnormality. Patient's symptoms are likely due to nicotine withdrawal. He smoking 2-3 packs of cigarettes daily he needs to be chewing Nicorette gum every 1-2 hours. Advised following up with his primary physician on Monday for possible medications to help him quit smoking cigarettes and manage his withdrawal symptoms. He's been given return precautions and is voiced understanding.   Final Clinical Impressions(s) / ED Diagnoses   Final diagnoses:  Nicotine withdrawal  Mood changes [facility])    New Prescriptions New Prescriptions   No medications on file     Loren Racer, MD 11/08/16 1859

## 2016-11-08 NOTE — ED Notes (Signed)
Patient transported to CT 

## 2016-11-08 NOTE — Telephone Encounter (Signed)
Spoke with patient's wife and advised that patient needed to go to ER. She agreed and said she was trying to convince him. She said he had an "accident" and she needed to get him cleaned up and then she would call EMS. I told her to go ahead and call them and not to worry about cleaning him due to her health. I reminded her that if he was confused, he could not make decisions for himself and that she could make decisions for him as his wife. She verbalized understanding and was appreciative of the call.

## 2016-11-08 NOTE — Telephone Encounter (Signed)
Agree - needs to be seen at ER to rule out bleed into the brain if AMS after fall.

## 2016-11-08 NOTE — ED Triage Notes (Signed)
Per EMS, pt from home, c/o altered mental status x2 weeks after getting an injection into his back for pain. Pt denies pain. Ambulatory with EMS. A&Ox3.

## 2016-11-21 ENCOUNTER — Emergency Department (HOSPITAL_COMMUNITY): Payer: PPO

## 2016-11-21 ENCOUNTER — Emergency Department (EMERGENCY_DEPARTMENT_HOSPITAL)
Admission: EM | Admit: 2016-11-21 | Discharge: 2016-11-22 | Disposition: A | Discharge status: Home / Self Care | Payer: PPO | Source: Home / Self Care | Attending: Emergency Medicine | Admitting: Emergency Medicine

## 2016-11-21 ENCOUNTER — Encounter (HOSPITAL_COMMUNITY): Payer: Self-pay | Admitting: Emergency Medicine

## 2016-11-21 DIAGNOSIS — G9340 Encephalopathy, unspecified: Secondary | ICD-10-CM | POA: Diagnosis not present

## 2016-11-21 DIAGNOSIS — Z79899 Other long term (current) drug therapy: Secondary | ICD-10-CM | POA: Insufficient documentation

## 2016-11-21 DIAGNOSIS — R410 Disorientation, unspecified: Secondary | ICD-10-CM

## 2016-11-21 DIAGNOSIS — F919 Conduct disorder, unspecified: Secondary | ICD-10-CM | POA: Diagnosis not present

## 2016-11-21 DIAGNOSIS — R4182 Altered mental status, unspecified: Secondary | ICD-10-CM

## 2016-11-21 DIAGNOSIS — I6789 Other cerebrovascular disease: Secondary | ICD-10-CM | POA: Diagnosis not present

## 2016-11-21 DIAGNOSIS — I2510 Atherosclerotic heart disease of native coronary artery without angina pectoris: Secondary | ICD-10-CM

## 2016-11-21 DIAGNOSIS — F4489 Other dissociative and conversion disorders: Secondary | ICD-10-CM | POA: Diagnosis not present

## 2016-11-21 DIAGNOSIS — N3000 Acute cystitis without hematuria: Secondary | ICD-10-CM | POA: Diagnosis not present

## 2016-11-21 DIAGNOSIS — Z8673 Personal history of transient ischemic attack (TIA), and cerebral infarction without residual deficits: Secondary | ICD-10-CM | POA: Insufficient documentation

## 2016-11-21 DIAGNOSIS — Z5181 Encounter for therapeutic drug level monitoring: Secondary | ICD-10-CM

## 2016-11-21 DIAGNOSIS — I10 Essential (primary) hypertension: Secondary | ICD-10-CM | POA: Insufficient documentation

## 2016-11-21 DIAGNOSIS — E119 Type 2 diabetes mellitus without complications: Secondary | ICD-10-CM

## 2016-11-21 DIAGNOSIS — F17210 Nicotine dependence, cigarettes, uncomplicated: Secondary | ICD-10-CM | POA: Diagnosis not present

## 2016-11-21 LAB — I-STAT CHEM 8, ED
BUN: 41 mg/dL — AB (ref 6–20)
CREATININE: 1.1 mg/dL (ref 0.61–1.24)
Calcium, Ion: 1.1 mmol/L — ABNORMAL LOW (ref 1.15–1.40)
Chloride: 102 mmol/L (ref 101–111)
Glucose, Bld: 107 mg/dL — ABNORMAL HIGH (ref 65–99)
HEMATOCRIT: 46 % (ref 39.0–52.0)
HEMOGLOBIN: 15.6 g/dL (ref 13.0–17.0)
POTASSIUM: 3.7 mmol/L (ref 3.5–5.1)
Sodium: 137 mmol/L (ref 135–145)
TCO2: 23 mmol/L (ref 0–100)

## 2016-11-21 LAB — COMPREHENSIVE METABOLIC PANEL
ALT: 18 U/L (ref 17–63)
AST: 19 U/L (ref 15–41)
Albumin: 4.2 g/dL (ref 3.5–5.0)
Alkaline Phosphatase: 67 U/L (ref 38–126)
Anion gap: 10 (ref 5–15)
BUN: 40 mg/dL — AB (ref 6–20)
CHLORIDE: 103 mmol/L (ref 101–111)
CO2: 24 mmol/L (ref 22–32)
CREATININE: 1.15 mg/dL (ref 0.61–1.24)
Calcium: 9.6 mg/dL (ref 8.9–10.3)
GFR calc Af Amer: >60 mL/min (ref >60)
GFR calc non Af Amer: >60 mL/min (ref >60)
Glucose, Bld: 114 mg/dL — ABNORMAL HIGH (ref 65–99)
Potassium: 3.7 mmol/L (ref 3.5–5.1)
SODIUM: 137 mmol/L (ref 135–145)
Total Bilirubin: 0.8 mg/dL (ref 0.3–1.2)
Total Protein: 6.8 g/dL (ref 6.5–8.1)

## 2016-11-21 LAB — PROTIME-INR
INR: 1.01
Prothrombin Time: 13.3 seconds (ref 11.4–15.2)

## 2016-11-21 LAB — URINALYSIS, ROUTINE W REFLEX MICROSCOPIC
Bilirubin Urine: NEGATIVE
GLUCOSE, UA: NEGATIVE mg/dL
Ketones, ur: 15 mg/dL — AB
LEUKOCYTES UA: NEGATIVE
NITRITE: NEGATIVE
PROTEIN: NEGATIVE mg/dL
Specific Gravity, Urine: 1.024 (ref 1.005–1.030)
pH: 5 (ref 5.0–8.0)

## 2016-11-21 LAB — DIFFERENTIAL
BASOS ABS: 0.1 10*3/uL (ref 0.0–0.1)
BASOS PCT: 0 %
Eosinophils Absolute: 0.2 10*3/uL (ref 0.0–0.7)
Eosinophils Relative: 1 %
Lymphocytes Relative: 13 %
Lymphs Abs: 2 10*3/uL (ref 0.7–4.0)
Monocytes Absolute: 1 10*3/uL (ref 0.1–1.0)
Monocytes Relative: 6 %
NEUTROS ABS: 12.7 10*3/uL — AB (ref 1.7–7.7)
NEUTROS PCT: 80 %

## 2016-11-21 LAB — URINE MICROSCOPIC-ADD ON

## 2016-11-21 LAB — RAPID URINE DRUG SCREEN, HOSP PERFORMED
AMPHETAMINES: NOT DETECTED
BENZODIAZEPINES: POSITIVE — AB
Barbiturates: NOT DETECTED
COCAINE: NOT DETECTED
Opiates: NOT DETECTED
Tetrahydrocannabinol: NOT DETECTED

## 2016-11-21 LAB — CBC
HCT: 44.5 % (ref 39.0–52.0)
Hemoglobin: 15.6 g/dL (ref 13.0–17.0)
MCH: 31.8 pg (ref 26.0–34.0)
MCHC: 35.1 g/dL (ref 30.0–36.0)
MCV: 90.6 fL (ref 78.0–100.0)
PLATELETS: 259 10*3/uL (ref 150–400)
RBC: 4.91 MIL/uL (ref 4.22–5.81)
RDW: 12.8 % (ref 11.5–15.5)
WBC: 16 10*3/uL — AB (ref 4.0–10.5)

## 2016-11-21 LAB — I-STAT TROPONIN, ED: Troponin i, poc: 0.01 ng/mL (ref 0.00–0.08)

## 2016-11-21 LAB — CBG MONITORING, ED: Glucose-Capillary: 101 mg/dL — ABNORMAL HIGH (ref 65–99)

## 2016-11-21 LAB — APTT: APTT: 26 s (ref 24–36)

## 2016-11-21 NOTE — ED Triage Notes (Signed)
Pt brought in via [facility] from home after wife states that he had an episode of confusion where "he took all his clothes off and tried to wear the mattress" per EMS.

## 2016-11-21 NOTE — ED Notes (Signed)
Pt to xray

## 2016-11-21 NOTE — Consult Note (Signed)
Admission H&P    Chief Complaint: Altered mental status with acute confusion.  HPI: Bobby WYNN Sr. is an 70 y.o. male with a history of previous stroke, tobacco abuse, hypertension, hyperlipidemia, carotid stenosis and coronary artery disease, brought to the ED in code stroke status following an acute change in mental status with confusion witnessed by patient's family. No focal deficits were noted. Patient reportedly had a similar change in mental status when he had a stroke 15 years ago. CT scan of his head showed no acute intracranial abnormality. No focal deficits were noted on exam. Code stroke was subsequently canceled. Electrolytes were unremarkable. BUN was 41 and creatinine was 1.1. Blood sugar was 114. WBC count was 16,000. He was afebrile. Urinalysis and urine drug screen results pending.  Past Medical History:  Diagnosis Date  . Abnormal drug screen 04/2015   pos oxycodone/valium and neg norco (04/2015), negative norco (06/2015), pos oxycodone neg norco pos benzos (06/2016)  . Atherosclerosis of arteries 02/2013   severe stenosis of SMA, celiac, moderate bilateral common iliac stenosis  . CAD (coronary artery disease) 2007   h/o silent MI  . Carotid stenosis    s/p R CEA - Dr. Hart [place]  . Chronic low back pain    [facility]  . 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 CVA (cerebral infarction)   . Diverticulosis   . DMII (diabetes mellitus, type 2) (HCC)   . Embolism and thrombosis of unspecified site   . GERD (gastroesophageal reflux disease)   . History of TIA (transient ischemic attack)   . HLD (hyperlipidemia)   . HTN (hypertension)   . Hx-TIA (transient ischemic attack) 2009  . Inguinal hernia   . Left wrist fracture 01/2013   evidence of old scaphoid fracture with scapholunate advanced collapse (SLAC) - declined ortho referral  . Osteoarthritis   . Psychosexual dysfunction with  inhibited sexual excitement   . PUD (peptic ulcer disease)   . Solitary pulmonary nodule    solitary pulmonary nodule (enlarged 2011)-follow up - stable on 2012 CT [facility])  . Tobacco abuse     Past Surgical History:  Procedure Laterality Date  . BACK SURGERY    . CARDIAC CATHETERIZATION  13  . CAROTID ENDARTERECTOMY Right 5/09   with dacron patch angioplasty with resection of redundant carotid artery with primary reanastomosis  . CERVICAL SPINE SURGERY    . COLONOSCOPY  2008   diverticulosis (Dr. Bosie Clos) repeat 2018  . ESOPHAGOGASTRODUODENOSCOPY  2007   deformed pylorus; gastric ulcer  . GASTROJEJUNOSTOMY  4/09   laparoscopic; and high selective anterior and posterior vagotomies  . INGUINAL HERNIA REPAIR Right 03/17/2013   Procedure: HERNIA REPAIR INGUINAL ADULT;  Surgeon: [name], DO;  Location: [facility] OR;  Service: General;  Laterality: Right;  . INSERTION OF MESH Right 03/17/2013   Procedure: INSERTION OF MESH ;  Surgeon: [name], DO;  Location: [facility] OR;  Service: General;  Laterality: Right;  . KNEE SURGERY Bilateral 1997   menisectomy  . NECK SURGERY  1984  . NOSE SURGERY  1982  . SPINE SURGERY     Pinched nerve    Family History  Problem Relation Age of Onset  . Heart attack Father     CABG 6v  . Arthritis Father   . Hypertension Father   . Alzheimer's disease Mother   . Arthritis Mother   . Hypertension Mother   . Bipolar  disorder Sister   . Bipolar disorder Sister   . Cancer Neg Hx   . Diabetes Neg Hx   . Stroke Neg Hx    Social History:  reports that he has been smoking Cigarettes.  He has a 8.25 pack-year smoking history. He has never used smokeless tobacco. He reports that he does not drink alcohol or use drugs.  Allergies:  Allergies  Allergen Reactions  . Varenicline Tartrate Other (See Comments)    REACTION: personality change    Medications: Preadmission medications were reviewed by me.  ROS: Unavailable due to patient's confusional  state.  Physical Examination: Blood pressure 131/87, pulse 67, temperature 98.2 F (36.8 C), temperature source Oral, resp. rate 20, height 5\' 7"  (1.702 m), weight 57.5 kg (126 lb 12.2 oz), SpO2 99 %.  HEENT-  Normocephalic, no lesions, without obvious abnormality.  Normal external eye and conjunctiva.  Normal TM's bilaterally.  Normal auditory canals and external ears. Normal external nose, mucus membranes and septum.  Normal pharynx. Neck supple with no masses, nodes, nodules or enlargement. Cardiovascular - regular rate and rhythm, S1, S2 normal, no murmur, click, rub or gallop Lungs - chest clear, no wheezing, rales, normal symmetric air entry Abdomen - soft, non-tender; bowel sounds normal; no masses,  no organomegaly Extremities - no joint deformities, effusion, or inflammation and no edema  Neurologic Examination: Mental Status: Alert, disoriented to current month, no acute distress.  Speech fluent without evidence of aphasia. Able to follow commands without difficulty. Cranial Nerves: II-Visual fields were normal. III/IV/VI-Pupils were equal and reacted normally to light. Extraocular movements were full and conjugate.    V/VII-no facial numbness and no facial weakness. VIII-normal. X-normal speech.. Motor: 5/5 bilaterally with normal tone and bulk Sensory: Normal throughout. Deep Tendon Reflexes: 1+ and symmetric. Plantars: Flexor bilaterally Cerebellar: Normal finger-to-nose testing.  Results for orders placed or performed during the hospital encounter of 11/21/16 (from the past 48 hour(s))  Protime-INR     Status: None   Collection Time: 11/21/16  8:14 PM  Result Value Ref Range   Prothrombin Time 13.3 11.4 - 15.2 seconds   INR 1.01   APTT     Status: None   Collection Time: 11/21/16  8:14 PM  Result Value Ref Range   aPTT 26 24 - 36 seconds  CBC     Status: Abnormal   Collection Time: 11/21/16  8:14 PM  Result Value Ref Range   WBC 16.0 (H) 4.0 - 10.5 K/uL   RBC  4.91 4.22 - 5.81 MIL/uL   Hemoglobin 15.6 13.0 - 17.0 g/dL   HCT 16.144.5 09.639.0 - 04.552.0 %   MCV 90.6 78.0 - 100.0 fL   MCH 31.8 26.0 - 34.0 pg   MCHC 35.1 30.0 - 36.0 g/dL   RDW 40.912.8 81.111.5 - 91.415.5 %   Platelets 259 150 - 400 K/uL  Differential     Status: Abnormal   Collection Time: 11/21/16  8:14 PM  Result Value Ref Range   Neutrophils Relative % 80 %   Neutro Abs 12.7 (H) 1.7 - 7.7 K/uL   Lymphocytes Relative 13 %   Lymphs Abs 2.0 0.7 - 4.0 K/uL   Monocytes Relative 6 %   Monocytes Absolute 1.0 0.1 - 1.0 K/uL   Eosinophils Relative 1 %   Eosinophils Absolute 0.2 0.0 - 0.7 K/uL   Basophils Relative 0 %   Basophils Absolute 0.1 0.0 - 0.1 K/uL  I-stat troponin, ED     Status: None  Collection Time: 11/21/16  8:19 PM  Result Value Ref Range   Troponin i, poc 0.01 0.00 - 0.08 ng/mL   Comment 3            Comment: Due to the release kinetics of cTnI, a negative result within the first hours of the onset of symptoms does not rule out myocardial infarction with certainty. If myocardial infarction is still suspected, repeat the test at appropriate intervals.   I-Stat Chem 8, ED     Status: Abnormal   Collection Time: 11/21/16  8:20 PM  Result Value Ref Range   Sodium 137 135 - 145 mmol/L   Potassium 3.7 3.5 - 5.1 mmol/L   Chloride 102 101 - 111 mmol/L   BUN 41 (H) 6 - 20 mg/dL   Creatinine, Ser 1.611.10 0.61 - 1.24 mg/dL   Glucose, Bld 096107 (H) 65 - 99 mg/dL   Calcium, Ion 0.451.10 (L) 1.15 - 1.40 mmol/L   TCO2 23 0 - 100 mmol/L   Hemoglobin 15.6 13.0 - 17.0 g/dL   HCT 40.946.0 81.139.0 - 91.452.0 %   Ct Head Code Stroke W/o Cm  Result Date: 11/21/2016 CLINICAL DATA:  Code stroke.  Acute onset altered mental status. EXAM: CT HEAD WITHOUT CONTRAST TECHNIQUE: Contiguous axial images were obtained from the base of the skull through the vertex without intravenous contrast. COMPARISON:  CT head without contrast 11/08/2016. MRI brain 07/21/2008 FINDINGS: Brain: No acute infarct, hemorrhage, or mass lesion  is present. The basal ganglia and insular ribbon are intact bilaterally. Mild atrophy and white matter changes are stable. The ventricles are proportionate to the degree of atrophy. No significant extra-axial fluid collection is present. Vascular: Dense atherosclerotic calcifications are present within the cavernous internal carotid arteries, right greater than left. No hyperdense vessel is evident. Skull: The calvarium is intact. Sinuses/Orbits: The paranasal sinuses and mastoid air cells are clear. The globes and orbits are intact. Other: Resolving soft tissue swelling is noted in the right parietal scalp. ASPECTS [facility]([place] Stroke Program Early CT Score) - Ganglionic level infarction (caudate, lentiform nuclei, internal capsule, insula, M1-M3 cortex): 7/7 - Supraganglionic infarction (M4-M6 cortex): 3/3 Total score (0-10 with 10 being normal): 10/10 IMPRESSION: 1. No acute intracranial abnormality or significant interval change. 2. Stable mild atrophy and white matter disease. 3. ASPECTS is 10/10 These results were called by telephone at the time of interpretation on 11/21/2016 at 8:26 pm to Dr. Lynden OxfordHRISTOPHER TEGELER , who verbally acknowledged these results. Electronically Signed   By: Marin Robertshristopher  Mattern M.D.   On: 11/21/2016 20:28    Assessment/Plan 70 year old man with multiple medical problems as well as history of previous stroke presenting with acute confusional state of unclear etiology. He is afebrile. However, WBC count is elevated at 16,000. Urinalysis is still pending. Urine drug screen is also pending. He has no clinical signs of an acute recurrent stroke.  Recommendations: 1. MRI of the brain to rule out possible acute recurrent stroke, although unlikely 2. EEG, routine adult study 3. Antibiotic management by primary hospitalist team if indicated  We will continue to follow this patient with you for now.  C.R. Roseanne RenoStewart, MD [facility] [phone]  11/21/2016, 8:52 PM

## 2016-11-21 NOTE — ED Provider Notes (Signed)
[facility]-EMER[facility]ENCY DEPT Provider Note   CSN: [id number] Arrival date & time: 11/21/16  2008     History   Chief Complaint Chief Complaint  Patient presents with  . Altered Mental Status    HPI Bobby SCHMADER Sr. is a 70 y.o. male.   Altered Mental Status   This is a new problem. The current episode started 1 to 2 hours ago. The problem has not changed since onset.Associated symptoms include confusion. Pertinent negatives include no somnolence, no seizures, no unresponsiveness, no agitation and no self-injury. His past medical history is significant for diabetes, CVA and TIA.    Past Medical History:  Diagnosis Date  . Abnormal drug screen 04/2015   pos oxycodone/valium and neg norco (04/2015), negative norco (06/2015), pos oxycodone neg norco pos benzos (06/2016)  . Atherosclerosis of arteries 02/2013   severe stenosis of SMA, celiac, moderate bilateral common iliac stenosis  . CAD (coronary artery disease) 2007   h/o silent MI  . Carotid stenosis    s/p R CEA - Dr. Hart Rochester  . Chronic low back pain    [facility]  . 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 CVA (cerebral infarction)   . Diverticulosis   . DMII (diabetes mellitus, type 2) (HCC)   . Embolism and thrombosis of unspecified site   . GERD (gastroesophageal reflux disease)   . History of TIA (transient ischemic attack)   . HLD (hyperlipidemia)   . HTN (hypertension)   . Hx-TIA (transient ischemic attack) 2009  . Inguinal hernia   . Left wrist fracture 01/2013   evidence of old scaphoid fracture with scapholunate advanced collapse (SLAC) - declined ortho referral  . Osteoarthritis   . Psychosexual dysfunction with inhibited sexual excitement   . PUD (peptic ulcer disease)   . Solitary pulmonary nodule    solitary pulmonary nodule (enlarged 2011)-follow up - stable on 2012 CT [facility])  . Tobacco abuse     Patient Active Problem  List   Diagnosis Date Noted  . Encounter for chronic pain management 07/24/2016  . Health maintenance examination 05/14/2016  . Advanced care planning/counseling discussion 05/14/2016  . Abnormal drug screen 04/24/2015  . COLD (chronic obstructive lung disease) (HCC) 04/02/2015  . Carotid stenosis 09/28/2014  . Chronic pain syndrome 10/29/2013  . Right inguinal hernia 02/12/2013  . Abdominal bruit 02/12/2013  . Chronic tophaceous gout 01/24/2013  . OA (osteoarthritis) of knee 05/09/2012  . Lumbar postlaminectomy syndrome 03/10/2012  . Lumbar spondylosis 03/10/2012  . NECK MASS 01/08/2011  . SUPERFICIAL VEIN THROMBOSIS 11/15/2010  . PULMONARY NODULE, SOLITARY 01/05/2008  . SEBACEOUS CYST, SCALP 04/21/2007  . Diabetes mellitus type 2, controlled, with complications (HCC) 02/20/2007  . HYPERCHOLESTEROLEMIA 02/20/2007  . 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 TOBACCO DEPENDENCE 02/20/2007  . HYPERTENSION, BENIGN SYSTEMIC 02/20/2007  . GASTROESOPHAGEAL REFLUX, NO ESOPHAGITIS 02/20/2007  . Osteoarthritis, multiple sites 02/20/2007    Past Surgical History:  Procedure Laterality Date  . BACK SURGERY    . CARDIAC CATHETERIZATION  13  . CAROTID ENDARTERECTOMY Right 5/09   with dacron patch angioplasty with resection of redundant carotid artery with primary reanastomosis  . CERVICAL SPINE SURGERY    . COLONOSCOPY  2008   diverticulosis (Dr. Bosie Clos) repeat 2018  . ESOPHAGOGASTRODUODENOSCOPY  2007   deformed pylorus; gastric ulcer  . GASTROJEJUNOSTOMY  4/09   laparoscopic; and high selective anterior and posterior vagotomies  . INGUINAL HERNIA REPAIR Right  03/17/2013   Procedure: HERNIA REPAIR INGUINAL ADULT;  Surgeon: [name], DO;  Location: [facility] OR;  Service: General;  Laterality: Right;  . INSERTION OF MESH Right 03/17/2013   Procedure: INSERTION OF MESH ;  Surgeon: [name], DO;  Location: [facility] OR;  Service: General;  Laterality: Right;  . KNEE SURGERY Bilateral 1997    menisectomy  . NECK SURGERY  1984  . NOSE SURGERY  1982  . SPINE SURGERY     Pinched nerve       Home Medications    Prior to Admission medications   Medication Sig Start Date End Date Taking? Authorizing Provider  Acetaminophen-Aspirin Buffered (EXCEDRIN BACK & BODY) 250-250 MG tablet Take 1 tablet by mouth every 6 (six) hours as needed for pain. Patient taking differently: Take 2 tablets by mouth every 6 (six) hours as needed for pain.  05/14/16   Eustaquio Boyden, MD  amLODipine (NORVASC) 10 MG tablet TAKE 1 TABLET (10 MG TOTAL) BY MOUTH DAILY 05/14/16   Eustaquio Boyden, MD  [facility] EXTRA STRENGTH PO) Take 1 capsule by mouth daily.    Historical Provider, MD  atorvastatin (LIPITOR) 40 MG tablet Take 1 tablet (40 mg total) by mouth daily. Patient taking differently: Take 20 mg by mouth daily.  05/14/16   Eustaquio Boyden, MD  Coenzyme Q10 (CO Q 10) 100 MG CAPS Take 100 mg by mouth daily.    Historical Provider, MD  enalapril-hydrochlorothiazide (VASERETIC) 10-25 MG tablet Take 1 tablet by mouth 2 (two) times daily. Patient taking differently: Take 1 tablet by mouth every evening.  05/14/16   Eustaquio Boyden, MD  fexofenadine (ALLEGRA) 180 MG tablet Take 180 mg by mouth daily as needed for allergies.     Historical Provider, MD  gabapentin (NEURONTIN) 300 MG capsule TAKE 1 CAPSULE FOUR TIMES A DAY Patient taking differently: TAKE 1 CAPSULE BID TIMES A DAY 10/15/16   Eustaquio Boyden, MD  glipiZIDE (GLUCOTROL) 5 MG tablet Take 1 tablet (5 mg total) by mouth daily before breakfast. Patient not taking: Reported on 11/08/2016 05/14/16   Eustaquio Boyden, MD  glipiZIDE (GLUCOTROL) 5 MG tablet Take 1 tablet (5 mg total) by mouth 2 (two) times daily before a meal. 10/15/16   Eustaquio Boyden, MD  HYDROcodone-acetaminophen (NORCO/VICODIN) 5-325 MG tablet Take 1 tablet by mouth every 6 (six) hours as needed. for pain 10/05/16   Historical Provider, MD  ibuprofen (ADVIL,MOTRIN) 200 MG  tablet Take 200 mg by mouth every 6 (six) hours as needed for headache or moderate pain.    Historical Provider, MD  L-Lysine 1000 MG TABS Take 1,000 mg by mouth daily.    Historical Provider, MD  lidocaine (ASPERCREME W/LIDOCAINE) 4 % cream Apply 1 application topically as needed (for pain).    Historical Provider, MD  metFORMIN (GLUCOPHAGE-XR) 750 MG 24 hr tablet Take one tablet daily with breakfast. Patient taking differently: Take 750 mg by mouth every evening.  05/14/16   Eustaquio Boyden, MD  metoprolol (LOPRESSOR) 50 MG tablet Take 1.5 tablets (75 mg total) by mouth 2 (two) times daily. Patient taking differently: Take 50 mg by mouth 2 (two) times daily.  05/14/16   Eustaquio Boyden, MD  Multiple Vitamin (ONE-A-DAY MENS PO) Take 1 tablet by mouth daily.    Historical Provider, MD  OMEGA-3 KRILL OIL PO Take 350 mg by mouth daily.    Historical Provider, MD  potassium gluconate 595 (99 K) MG TABS tablet Take 595 mg by mouth daily.  Historical Provider, MD    Family History Family History  Problem Relation Age of Onset  . Heart attack Father     CABG 6v  . Arthritis Father   . Hypertension Father   . Alzheimer's disease Mother   . Arthritis Mother   . Hypertension Mother   . Bipolar disorder Sister   . Bipolar disorder Sister   . Cancer Neg Hx   . Diabetes Neg Hx   . Stroke Neg Hx     Social History Social History  Substance Use Topics  . Smoking status: Current Every Day Smoker    Packs/day: 0.25    Years: 33.00    Types: Cigarettes  . Smokeless tobacco: Never Used  . Alcohol use No     Allergies   Varenicline tartrate   Review of Systems Review of Systems  Constitutional: Negative for chills and fever.  HENT: Negative for ear pain and sore throat.   Eyes: Negative for pain and visual disturbance.  Respiratory: Negative for cough and shortness of breath.   Cardiovascular: Negative for chest pain and palpitations.  Gastrointestinal: Negative for abdominal pain  and vomiting.  Genitourinary: Negative for dysuria and hematuria.  Musculoskeletal: Negative for arthralgias and back pain.  Skin: Negative for color change and rash.  Neurological: Negative for seizures and syncope.  Psychiatric/Behavioral: Positive for behavioral problems and confusion. Negative for agitation and self-injury.  All other systems reviewed and are negative.    Physical Exam Updated Vital Signs BP 121/76   Pulse 65   Temp 98.2 F (36.8 C) (Oral)   Resp 19   Ht 5\' 7"  (1.702 m)   Wt 57.5 kg   SpO2 100%   BMI 19.85 kg/m   Physical Exam  Constitutional: Vital signs are normal. He appears cachectic. He does not appear ill. No distress.  HENT:  Head: Normocephalic and atraumatic.  Eyes: Conjunctivae and EOM are normal. Pupils are equal, round, and reactive to light. Right eye exhibits no nystagmus. Left eye exhibits no nystagmus.  Neck: Neck supple.  Cardiovascular: Normal rate and regular rhythm.  PMI is not displaced.  Exam reveals no decreased pulses.   No murmur heard. Pulmonary/Chest: Effort normal and breath sounds normal. No respiratory distress. He has no decreased breath sounds. He has no wheezes.  Abdominal: Soft. Bowel sounds are normal. He exhibits no pulsatile liver and no pulsatile midline mass. There is no tenderness. There is no rigidity, no rebound and no guarding.  Musculoskeletal: He exhibits no edema.  Neurological: He is alert. He has normal strength. No cranial nerve deficit or sensory deficit. Coordination normal. GCS eye subscore is 4. GCS verbal subscore is 5. GCS motor subscore is 6.  Skin: Skin is warm and dry.  Psychiatric: He has a normal mood and affect.  Nursing note and vitals reviewed.    ED Treatments / Results  Labs (all labs ordered are listed, but only abnormal results are displayed) Labs Reviewed  CBC - Abnormal; Notable for the following:       Result Value   WBC 16.0 (*)    All other components within normal limits    DIFFERENTIAL - Abnormal; Notable for the following:    Neutro Abs 12.7 (*)    All other components within normal limits  COMPREHENSIVE METABOLIC PANEL - Abnormal; Notable for the following:    Glucose, Bld 114 (*)    BUN 40 (*)    All other components within normal limits  URINALYSIS, ROUTINE W REFLEX  MICROSCOPIC (NOT [facility]) - Abnormal; Notable for the following:    Hgb urine dipstick SMALL (*)    Ketones, ur 15 (*)    All other components within normal limits  RAPID URINE DRUG SCREEN, HOSP PERFORMED - Abnormal; Notable for the following:    Benzodiazepines POSITIVE (*)    All other components within normal limits  URINE MICROSCOPIC-ADD ON - Abnormal; Notable for the following:    Squamous Epithelial / LPF 0-5 (*)    Bacteria, UA RARE (*)    All other components within normal limits  CBG MONITORING, ED - Abnormal; Notable for the following:    Glucose-Capillary 101 (*)    All other components within normal limits  I-STAT CHEM 8, ED - Abnormal; Notable for the following:    BUN 41 (*)    Glucose, Bld 107 (*)    Calcium, Ion 1.10 (*)    All other components within normal limits  PROTIME-INR  APTT  I-STAT TROPOININ, ED    EKG  EKG Interpretation  Date/Time:  Wednesday November 21 2016 20:32:52 EST Ventricular Rate:  62 PR Interval:    QRS Duration: 90 QT Interval:  413 QTC Calculation: 420 R Axis:   67 Text Interpretation:  Sinus rhythm Baseline wander in lead(s) V2 When compared to prior ECG, slightly flattened T waves in V2 Wandering baseline Confirmed by Rush LandmarkEGELER MD, CHRISTOPHER [phone]) on 11/21/2016 11:17:32 PM       Radiology Dg Chest 2 View  Result Date: 11/21/2016 CLINICAL DATA:  Acute onset of confusion.  Initial encounter. EXAM: CHEST  2 VIEW COMPARISON:  Chest radiograph performed 04/01/2015 FINDINGS: The lungs are well-aerated and clear. There is no evidence of focal opacification, pleural effusion or pneumothorax. The heart is normal in size; the  mediastinal contour is within normal limits. No acute osseous abnormalities are seen. There is chronic superior subluxation of both humeral heads. IMPRESSION: No acute cardiopulmonary process seen. Electronically Signed   By: Roanna RaiderJeffery  Chang M.D.   On: 11/21/2016 21:20   Ct Head Code Stroke W/o Cm  Result Date: 11/21/2016 CLINICAL DATA:  Code stroke.  Acute onset altered mental status. EXAM: CT HEAD WITHOUT CONTRAST TECHNIQUE: Contiguous axial images were obtained from the base of the skull through the vertex without intravenous contrast. COMPARISON:  CT head without contrast 11/08/2016. MRI brain 07/21/2008 FINDINGS: Brain: No acute infarct, hemorrhage, or mass lesion is present. The basal ganglia and insular ribbon are intact bilaterally. Mild atrophy and white matter changes are stable. The ventricles are proportionate to the degree of atrophy. No significant extra-axial fluid collection is present. Vascular: Dense atherosclerotic calcifications are present within the cavernous internal carotid arteries, right greater than left. No hyperdense vessel is evident. Skull: The calvarium is intact. Sinuses/Orbits: The paranasal sinuses and mastoid air cells are clear. The globes and orbits are intact. Other: Resolving soft tissue swelling is noted in the right parietal scalp. ASPECTS [facility]([place] Stroke Program Early CT Score) - Ganglionic level infarction (caudate, lentiform nuclei, internal capsule, insula, M1-M3 cortex): 7/7 - Supraganglionic infarction (M4-M6 cortex): 3/3 Total score (0-10 with 10 being normal): 10/10 IMPRESSION: 1. No acute intracranial abnormality or significant interval change. 2. Stable mild atrophy and white matter disease. 3. ASPECTS is 10/10 These results were called by telephone at the time of interpretation on 11/21/2016 at 8:26 pm to Dr. Lynden OxfordHRISTOPHER TEGELER , who verbally acknowledged these results. Electronically Signed   By: Marin Robertshristopher  Mattern M.D.   On: 11/21/2016 20:28  Procedures Procedures (including critical care time)  Medications Ordered in ED Medications - No data to display   Initial Impression / Assessment and Plan / ED Course  I have reviewed the triage vital signs and the nursing notes.  Pertinent labs & imaging results that were available during my care of the patient were reviewed by me and considered in my medical decision making (see chart for details).  Clinical Course     70 year old gentleman comes to us today with report of confusion and change in behavior. He reports that his wife said he had gone off the deep and was acting strange. Attempting to get collateral from the wife. Originally called code stroke CT done in neurology evaluation shows no acute intracranial abnormalities also shows no focal neurologic deficits. Some mild confusion. Stroke is discontinued. Laboratory analysis significant for mild leukocytosis to 16, he is afebrile vital signs are stable. He has no motor deficit, no sensory deficits, no discoordination, normal ambulation, alert and oriented 4. Remainder of his laboratory workup thus far is unremarkable. EKG shows normal sinus rhythm with no acute ischemia interval underbody arrhythmia. Chest x-ray is unremarkable for any acute cardiopulmonary pathology. UA  Shows no signs of infection. urine drug screen is positive for benzodiazepines. He's not prescribe benzodiazepines but his wife has been in the house. Seems likely this patient somehow got ahold of them is altered due to these. He is behaving normally answer questions appropriately normal neurologic exam even on repeat evaluation. This is likely this patient also has some cognitive concerns with memory issues over the past several years. His wife has a same concerns as well. Recommended that they see the primary care provider for screening of dementia or other causes of altered memory. Vital signs stable time of discharge. Strict return precautions are  given..  Final Clinical Impressions(s) / ED Diagnoses   Final diagnoses:  Disorientation    New Prescriptions New Prescriptions   No medications on file     Cherlynn PerchesEric Oriel Rumbold, MD 11/22/16 0022    Canary Brimhristopher J Tegeler, MD 11/22/16 1144

## 2016-11-21 NOTE — Progress Notes (Signed)
[name] [facility] with new onset confusion, per spouse pt had similar symtoms with a previous stroke 15 year sago. Spouse reported to EMS pt began stripping off al his clothes and trying to wrap himself up in the mattress. NIHSS 1 CBG 114. Code stroke cancelled, to be admitted for further work up of confusion, neuro to continue to follow for now. Pt scheduled for MRI

## 2016-11-22 ENCOUNTER — Emergency Department (HOSPITAL_COMMUNITY)
Admission: EM | Admit: 2016-11-22 | Discharge: 2016-11-24 | Disposition: A | Discharge status: Other Acute Inpatient Hospital | Payer: PPO | Attending: Emergency Medicine | Admitting: Emergency Medicine

## 2016-11-22 ENCOUNTER — Emergency Department (HOSPITAL_COMMUNITY): Payer: PPO

## 2016-11-22 ENCOUNTER — Telehealth: Payer: Self-pay | Admitting: Family Medicine

## 2016-11-22 ENCOUNTER — Encounter (HOSPITAL_COMMUNITY): Payer: Self-pay | Admitting: Emergency Medicine

## 2016-11-22 DIAGNOSIS — N3000 Acute cystitis without hematuria: Secondary | ICD-10-CM

## 2016-11-22 DIAGNOSIS — R410 Disorientation, unspecified: Secondary | ICD-10-CM

## 2016-11-22 DIAGNOSIS — R4182 Altered mental status, unspecified: Secondary | ICD-10-CM | POA: Diagnosis not present

## 2016-11-22 DIAGNOSIS — Z79899 Other long term (current) drug therapy: Secondary | ICD-10-CM | POA: Insufficient documentation

## 2016-11-22 DIAGNOSIS — F919 Conduct disorder, unspecified: Secondary | ICD-10-CM | POA: Insufficient documentation

## 2016-11-22 DIAGNOSIS — Z8673 Personal history of transient ischemic attack (TIA), and cerebral infarction without residual deficits: Secondary | ICD-10-CM | POA: Insufficient documentation

## 2016-11-22 DIAGNOSIS — I2510 Atherosclerotic heart disease of native coronary artery without angina pectoris: Secondary | ICD-10-CM | POA: Insufficient documentation

## 2016-11-22 DIAGNOSIS — E119 Type 2 diabetes mellitus without complications: Secondary | ICD-10-CM | POA: Insufficient documentation

## 2016-11-22 DIAGNOSIS — I10 Essential (primary) hypertension: Secondary | ICD-10-CM | POA: Insufficient documentation

## 2016-11-22 DIAGNOSIS — F69 Unspecified disorder of adult personality and behavior: Secondary | ICD-10-CM

## 2016-11-22 DIAGNOSIS — R531 Weakness: Secondary | ICD-10-CM | POA: Diagnosis not present

## 2016-11-22 DIAGNOSIS — F17210 Nicotine dependence, cigarettes, uncomplicated: Secondary | ICD-10-CM | POA: Insufficient documentation

## 2016-11-22 LAB — COMPREHENSIVE METABOLIC PANEL
ALK PHOS: 74 U/L (ref 38–126)
ALT: 19 U/L (ref 17–63)
AST: 20 U/L (ref 15–41)
Albumin: 4 g/dL (ref 3.5–5.0)
Anion gap: 11 (ref 5–15)
BILIRUBIN TOTAL: 0.6 mg/dL (ref 0.3–1.2)
BUN: 37 mg/dL — AB (ref 6–20)
CALCIUM: 9.7 mg/dL (ref 8.9–10.3)
CO2: 25 mmol/L (ref 22–32)
CREATININE: 1.14 mg/dL (ref 0.61–1.24)
Chloride: 102 mmol/L (ref 101–111)
Glucose, Bld: 95 mg/dL (ref 65–99)
Potassium: 3.6 mmol/L (ref 3.5–5.1)
Sodium: 138 mmol/L (ref 135–145)
TOTAL PROTEIN: 6.4 g/dL — AB (ref 6.5–8.1)

## 2016-11-22 LAB — CBC WITH DIFFERENTIAL/PLATELET
BASOS ABS: 0 10*3/uL (ref 0.0–0.1)
Basophils Relative: 0 %
Eosinophils Absolute: 0.1 10*3/uL (ref 0.0–0.7)
Eosinophils Relative: 1 %
HEMATOCRIT: 43.8 % (ref 39.0–52.0)
Hemoglobin: 15.2 g/dL (ref 13.0–17.0)
LYMPHS ABS: 2.1 10*3/uL (ref 0.7–4.0)
LYMPHS PCT: 18 %
MCH: 31.2 pg (ref 26.0–34.0)
MCHC: 34.7 g/dL (ref 30.0–36.0)
MCV: 89.9 fL (ref 78.0–100.0)
MONO ABS: 0.9 10*3/uL (ref 0.1–1.0)
Monocytes Relative: 8 %
NEUTROS ABS: 8.8 10*3/uL — AB (ref 1.7–7.7)
Neutrophils Relative %: 73 %
Platelets: 256 10*3/uL (ref 150–400)
RBC: 4.87 MIL/uL (ref 4.22–5.81)
RDW: 12.5 % (ref 11.5–15.5)
WBC: 11.9 10*3/uL — AB (ref 4.0–10.5)

## 2016-11-22 LAB — RAPID URINE DRUG SCREEN, HOSP PERFORMED
AMPHETAMINES: NOT DETECTED
Barbiturates: NOT DETECTED
Benzodiazepines: POSITIVE — AB
Cocaine: NOT DETECTED
Opiates: NOT DETECTED
Tetrahydrocannabinol: NOT DETECTED

## 2016-11-22 LAB — URINALYSIS, ROUTINE W REFLEX MICROSCOPIC
BILIRUBIN URINE: NEGATIVE
Glucose, UA: NEGATIVE mg/dL
Ketones, ur: NEGATIVE mg/dL
NITRITE: NEGATIVE
Protein, ur: NEGATIVE mg/dL
Specific Gravity, Urine: 1.025 (ref 1.005–1.030)
pH: 5 (ref 5.0–8.0)

## 2016-11-22 LAB — URINE MICROSCOPIC-ADD ON

## 2016-11-22 LAB — ETHANOL

## 2016-11-22 MED ORDER — LORAZEPAM 2 MG/ML IJ SOLN
1.0000 mg | Freq: Once | INTRAMUSCULAR | Status: AC
  Administered 2016-11-22: 1 mg via INTRAMUSCULAR
  Filled 2016-11-22: qty 1

## 2016-11-22 MED ORDER — LORAZEPAM 1 MG PO TABS
1.0000 mg | ORAL_TABLET | Freq: Once | ORAL | Status: AC
  Administered 2016-11-22: 1 mg via ORAL
  Filled 2016-11-22: qty 1

## 2016-11-22 NOTE — ED Notes (Signed)
Pt increasingly restless instructing pt repeatedly to stay in bed pt given crackers for comfort remains within site of Nurses station safety maintained

## 2016-11-22 NOTE — ED Notes (Signed)
Pt back from MRI and placed on monitor.  Helping pt eat at this time.

## 2016-11-22 NOTE — ED Notes (Signed)
Pt up and restless and climbing out of the bed ER MD made aware orders received safety sitter at bedside

## 2016-11-22 NOTE — BH Assessment (Signed)
Tele Assessment Note   Bobby MimesRonald B Yager Sr. is an 70 y.o. male who presents to the ED after his neighbors witnessed him walking around the yard naked. The pt was disoriented and did not engage with the assessor after multiple attempts. There was another RN in the room who attempted to assist the pt in completing the assessment but the pt was unable to be redirected. The pt continued to try to get out of bed during the assessment, and refused to respond on interact with the assessor.  In order to obtain collateral information, the assessor spoke with the pt's wife who was very tearful during the telephone conversation. The pt's wife reports this behavior is bizarre for this pt and he is "a loving, sweet, kind, gentle man and I miss him so much." Pt's wife reports the pt fell about 2 days ago and she thought he was okay but she noticed he had 2 black eyes. The pt's wife reports the pt began acting erratically and was "wandering around outside naked." Pt's wife reports she has had to lock the medicine cabinet for his safety. Pt's wife continued to express sad emotion as she cried while telling the assessor that she was "so tired" and "just wants him to be who he used to be."  Per Nira ConnJason Berry, FNP pt meets criteria for gero-psych placement. [name], RN has been notified of the recommended disposition. Selena BattenKim, RN reports they are trying to rule out a possible medical condition for the client such as a UTI. Labs are pending.   Diagnosis: Deferred  Past Medical History:  Past Medical History:  Diagnosis Date   Abnormal drug screen 04/2015   pos oxycodone/valium and neg norco (04/2015), negative norco (06/2015), pos oxycodone neg norco pos benzos (06/2016)   Atherosclerosis of arteries 02/2013   severe stenosis of SMA, celiac, moderate bilateral common iliac stenosis   CAD (coronary artery disease) 2007   h/o silent MI   Carotid stenosis    s/p R CEA - Dr. 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   remote h/o podagra, tophaceous by wrist Xray   CVA (cerebral infarction)    Diverticulosis    DMII (diabetes mellitus, type 2) (HCC)    Embolism and thrombosis of unspecified site    GERD (gastroesophageal reflux disease)    History of TIA (transient ischemic attack)    HLD (hyperlipidemia)    HTN (hypertension)    Hx-TIA (transient ischemic attack) 2009   Inguinal hernia    Left wrist fracture 01/2013   evidence of old scaphoid fracture with scapholunate advanced collapse (SLAC) - declined ortho referral   Osteoarthritis    Psychosexual dysfunction with inhibited sexual excitement    PUD (peptic ulcer disease)    Solitary pulmonary nodule    solitary pulmonary nodule (enlarged 2011)-follow up - stable on 2012 CT [name]   Tobacco abuse     Past Surgical History:  Procedure Laterality Date   BACK SURGERY     CARDIAC CATHETERIZATION  13   CAROTID ENDARTERECTOMY Right 5/09   with dacron patch angioplasty with resection of redundant carotid artery with primary reanastomosis   CERVICAL SPINE SURGERY     COLONOSCOPY  2008   diverticulosis (Dr. Bosie ClosSchooler) repeat 2018   ESOPHAGOGASTRODUODENOSCOPY  2007   deformed pylorus; gastric ulcer   GASTROJEJUNOSTOMY  4/09   laparoscopic; and high selective anterior and posterior vagotomies  INGUINAL HERNIA REPAIR Right 03/17/2013   Procedure: HERNIA REPAIR INGUINAL ADULT;  Surgeon: [name], DO;  Location: [facility] OR;  Service: General;  Laterality: Right;   INSERTION OF MESH Right 03/17/2013   Procedure: INSERTION OF MESH ;  Surgeon: [name], DO;  Location: [facility] OR;  Service: General;  Laterality: Right;   KNEE SURGERY Bilateral 1997   menisectomy   NECK SURGERY  1984   NOSE SURGERY  1982   SPINE SURGERY     Pinched nerve    Family History:  Family History  Problem Relation Age of Onset   Heart attack Father     CABG 6v    Arthritis Father    Hypertension Father    Alzheimer's disease Mother    Arthritis Mother    Hypertension Mother    Bipolar disorder Sister    Bipolar disorder Sister    Cancer Neg Hx    Diabetes Neg Hx    Stroke Neg Hx     Social History:  reports that he has been smoking Cigarettes.  He has a 8.25 pack-year smoking history. He has never used smokeless tobacco. He reports that he does not drink alcohol or use drugs.  Additional Social History:  Alcohol / Drug Use Pain Medications: UTA  Prescriptions: UTA  Over the Counter: UTA  History of alcohol / drug use?: No history of alcohol / drug abuse  CIWA: CIWA-Ar BP: 135/79 Pulse Rate: 88 COWS:    PATIENT STRENGTHS: (choose at least two) Financial means Supportive family/friends  Allergies:  Allergies  Allergen Reactions   Varenicline Tartrate Other (See Comments)    REACTION: personality change    Home Medications:  (Not in a hospital admission)  OB/GYN Status:  No LMP for male patient.  General Assessment Data Location of Assessment: [facility] ED TTS Assessment: In system Is this a Tele or Face-to-Face Assessment?: Tele Assessment Is this an Initial Assessment or a Re-assessment for this encounter?: Initial Assessment Marital status: Married Is patient pregnant?: No Pregnancy Status: No Living Arrangements: Spouse/significant other Can pt return to current living arrangement?: Yes Admission Status: Voluntary Is patient capable of signing voluntary admission?: Yes Referral Source: Self/Family/Friend Insurance type: IT sales professional     Crisis Care Plan Living Arrangements: Spouse/significant other Name of Psychiatrist: none Name of Therapist: none  Education Status Is patient currently in school?: No Highest grade of school patient has completed: unknown  Risk to self with the past 6 months Suicidal Ideation: No Has patient been a risk to self within the past 6 months prior to admission? :  No Suicidal Intent: No Has patient had any suicidal intent within the past 6 months prior to admission? : No Is patient at risk for suicide?: No Suicidal Plan?: No Has patient had any suicidal plan within the past 6 months prior to admission? : No Access to Means: No What has been your use of drugs/alcohol within the last 12 months?: none reported Previous Attempts/Gestures: No Triggers for Past Attempts: None known Intentional Self Injurious Behavior: None Family Suicide History: Unknown Recent stressful life event(s): Other (Comment) (wife reports the pt fell 2 days ago) Persecutory voices/beliefs?: No Depression: No Substance abuse history and/or treatment for substance abuse?: No Suicide prevention information given to non-admitted patients: Not applicable  Risk to Others within the past 6 months Homicidal Ideation: No Does patient have any lifetime risk of violence toward others beyond the six months prior to admission? : No Thoughts of Harm to Others: No Current  Homicidal Intent: No Current Homicidal Plan: No Access to Homicidal Means: No History of harm to others?: No Assessment of Violence: None Noted Does patient have access to weapons?: No Criminal Charges Pending?: No Does patient have a court date: No Is patient on probation?: No  Psychosis Hallucinations: None noted Delusions: Unspecified  Mental Status Report Appearance/Hygiene: Disheveled, Bizarre Eye Contact: Poor Motor Activity: Agitation, Rigidity, Restlessness Speech: Unable to assess Level of Consciousness: Quiet/awake Mood: Other (Comment) (UTA, Pt disoriented and unable to engage ) Affect: Unable to Assess Anxiety Level: None Thought Processes: Unable to Assess Judgement: Unable to Assess Orientation: Not oriented Obsessive Compulsive Thoughts/Behaviors: Unable to Assess  Cognitive Functioning Concentration: Unable to Assess Memory: Unable to Assess IQ: Average Insight: Unable to  Assess Impulse Control: Unable to Assess Appetite:  (UTA, Pt disoriented and unable to engage ) Sleep: Unable to Assess Vegetative Symptoms: Unable to Assess  ADLScreening [facility]) Patient's cognitive ability adequate to safely complete daily activities?: No Patient able to express need for assistance with ADLs?: No Independently performs ADLs?: No (UTA, Pt disoriented and unable to answer questions)  Prior Inpatient Therapy Prior Inpatient Therapy: No  Prior Outpatient Therapy Prior Outpatient Therapy: No Does patient have an ACCT team?: No Does patient have Intensive In-House Services?  : No Does patient have [facility] services? : No Does patient have P4CC services?: No  ADL Screening (condition at time of admission) Patient's cognitive ability adequate to safely complete daily activities?: No Is the patient deaf or have difficulty hearing?:  (UTA, Pt disoriented and unable to answer questions) Does the patient have difficulty seeing, even when wearing glasses/contacts?:  (UTA, Pt disoriented and unable to answer questions) Does the patient have difficulty concentrating, remembering, or making decisions?: Yes Patient able to express need for assistance with ADLs?: No Does the patient have difficulty dressing or bathing?:  (UTA, Pt disoriented and unable to answer questions) Independently performs ADLs?: No (UTA, Pt disoriented and unable to answer questions) Does the patient have difficulty walking or climbing stairs?:  (UTA, Pt disoriented and unable to answer questions) Weakness of Legs:  (UTA, Pt disoriented and unable to answer questions) Weakness of Arms/Hands:  (UTA, Pt disoriented and unable to answer questions)  Home Assistive Devices/Equipment Home Assistive Devices/Equipment:  (UTA, Pt disoriented and unable to answer questions)    Abuse/Neglect Assessment (Assessment to be complete while patient is alone) Physical Abuse: Denies Verbal Abuse:  Denies Sexual Abuse: Denies Exploitation of patient/patient's resources: Denies Self-Neglect: Denies     Merchant navy officerAdvance Directives (For Healthcare) Does Patient Have a Medical Advance Directive?: No (Pt disoriented and unable to answer questions)    Additional Information 1:1 In Past 12 Months?: No CIRT Risk: Yes Elopement Risk: No Does patient have medical clearance?: No (pending per Selena BattenKim, RN )     Disposition:  Disposition Initial Assessment Completed for this Encounter: Yes Disposition of Patient: Inpatient treatment program Type of inpatient treatment program: Adult (gero-psych placement)  Karolee Ohsquicha R Duff 11/23/2016 12:02 AM

## 2016-11-22 NOTE — Telephone Encounter (Signed)
Per chart review tab pt is at [facility]. 

## 2016-11-22 NOTE — ED Notes (Signed)
Attempted TTS.  Pt would not cooperate

## 2016-11-22 NOTE — ED Notes (Signed)
Pt appears very restless and not following instruction to remain in bed, pt in room taking clothes off and pulling at equipment.  Pt appears anxious and keeps making unclear statements about a variety of topics

## 2016-11-22 NOTE — ED Notes (Signed)
Pt appears very confused and disoriented to time, place and situation pt appears to repetitively ask questions and attempt to stand up out of the bed.  Pt does not appear that he is able to care for himself.  ER MD made aware

## 2016-11-22 NOTE — ED Provider Notes (Signed)
Physician note from Dr.Schlossman reviewed. MRI of brain shows no acute findings. Patient's behaviors have been erratic. He has been restless and trying to get out of bed. It is unsafe for him to go home. We will obtain behavioral health consult. Patient will remain in pod C overnight to be evaluated by social work in the a.m.   Bobby HutchingBrian Nanci Lakatos, MD 11/22/16 [phone]

## 2016-11-22 NOTE — Telephone Encounter (Signed)
plz call tomorrow and offer f/u appt on Monday (30 min).

## 2016-11-22 NOTE — ED Provider Notes (Signed)
[facility]EM[facility] DEPT Provider Note   CSN: [id number] Arrival date & time: 11/22/16  1347     History   Chief Complaint Chief Complaint  Patient presents with  . Altered Mental Status    HPI LOTUS Bobby Henry Sr. is a 70 y.o. male.  HPI   70 year old male with history of coronary artery disease, carotid stenosis, chronic low back pain with previous encounters for pain management, hypertension, hyperlipidemia, diabetes, stroke who presents with concern for altered mental status. History is limited by absence of family members and inability to get in touch with them, however per neighbor and EMS, patient has been having episodes of running through the yard without clothes on throughout this week He came in to ED yesterday as a code stroke for acute confusion, and at that time Dr. Roseanne Reno of neurology had recommended MRI and EEG although had low suspicion for acute ischemia.  The wife had spoken with physician yesterday who felt that he could be developing dementia, and overall suspect this is the most likely etiology of his behavioral disturbance. Also reports he took one of her ativan. Wife had told EMS he was diagnosed with DTs because this was on discharge paperwork which she did not agree with because he doesn't drink--however chart review shows only diagnosis of AMS however discharge paperwork instructions for DTs.  Today, patient found running through yard without pants off.  He does not recall this episode on my history, but he does say "yea that's what they were saying I did."  Reports that "my wife just wants to get rid of me. That's what's going on. She didn't come to see me yesterday in the hospital until 1030PM when she came to pick me up."   He denies any acute concerns, including no fever, no chest pain, no neuro symptoms, no headache.  Reports he does not drink etoh   Past Medical History:  Diagnosis Date  . Abnormal drug screen 04/2015   pos oxycodone/valium and neg norco  (04/2015), negative norco (06/2015), pos oxycodone neg norco pos benzos (06/2016)  . Atherosclerosis of arteries 02/2013   severe stenosis of SMA, celiac, moderate bilateral common iliac stenosis  . CAD (coronary artery disease) 2007   h/o silent MI  . Carotid stenosis    s/p R CEA - Dr. Hart Rochester  . Chronic low back pain    [facility])  . 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 CVA (cerebral infarction)   . Diverticulosis   . DMII (diabetes mellitus, type 2) (HCC)   . Embolism and thrombosis of unspecified site   . GERD (gastroesophageal reflux disease)   . History of TIA (transient ischemic attack)   . HLD (hyperlipidemia)   . HTN (hypertension)   . Hx-TIA (transient ischemic attack) 2009  . Inguinal hernia   . Left wrist fracture 01/2013   evidence of old scaphoid fracture with scapholunate advanced collapse (SLAC) - declined ortho referral  . Osteoarthritis   . Psychosexual dysfunction with inhibited sexual excitement   . PUD (peptic ulcer disease)   . Solitary pulmonary nodule    solitary pulmonary nodule (enlarged 2011)-follow up - stable on 2012 CT [facility])  . Tobacco abuse     Patient Active Problem List   Diagnosis Date Noted  . Encounter for chronic pain management 07/24/2016  . Health maintenance examination 05/14/2016  . Advanced care planning/counseling discussion 05/14/2016  . Abnormal drug screen 04/24/2015  .  COLD (chronic obstructive lung disease) (HCC) 04/02/2015  . Carotid stenosis 09/28/2014  . Chronic pain syndrome 10/29/2013  . Right inguinal hernia 02/12/2013  . Abdominal bruit 02/12/2013  . Chronic tophaceous gout 01/24/2013  . OA (osteoarthritis) of knee 05/09/2012  . Lumbar postlaminectomy syndrome 03/10/2012  . Lumbar spondylosis 03/10/2012  . NECK MASS 01/08/2011  . SUPERFICIAL VEIN THROMBOSIS 11/15/2010  . PULMONARY NODULE, SOLITARY 01/05/2008  . SEBACEOUS CYST,  SCALP 04/21/2007  . Diabetes mellitus type 2, controlled, with complications (HCC) 02/20/2007  . HYPERCHOLESTEROLEMIA 02/20/2007  . 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 TOBACCO DEPENDENCE 02/20/2007  . HYPERTENSION, BENIGN SYSTEMIC 02/20/2007  . GASTROESOPHAGEAL REFLUX, NO ESOPHAGITIS 02/20/2007  . Osteoarthritis, multiple sites 02/20/2007    Past Surgical History:  Procedure Laterality Date  . BACK SURGERY    . CARDIAC CATHETERIZATION  13  . CAROTID ENDARTERECTOMY Right 5/09   with dacron patch angioplasty with resection of redundant carotid artery with primary reanastomosis  . CERVICAL SPINE SURGERY    . COLONOSCOPY  2008   diverticulosis (Dr. Bosie Clos) repeat 2018  . ESOPHAGOGASTRODUODENOSCOPY  2007   deformed pylorus; gastric ulcer  . GASTROJEJUNOSTOMY  4/09   laparoscopic; and high selective anterior and posterior vagotomies  . INGUINAL HERNIA REPAIR Right 03/17/2013   Procedure: HERNIA REPAIR INGUINAL ADULT;  Surgeon: [name], DO;  Location: [facility] OR;  Service: General;  Laterality: Right;  . INSERTION OF MESH Right 03/17/2013   Procedure: INSERTION OF MESH ;  Surgeon: [name], DO;  Location: [facility] OR;  Service: General;  Laterality: Right;  . KNEE SURGERY Bilateral 1997   menisectomy  . NECK SURGERY  1984  . NOSE SURGERY  1982  . SPINE SURGERY     Pinched nerve       Home Medications    Prior to Admission medications   Medication Sig Start Date End Date Taking? Authorizing Provider  Acetaminophen-Aspirin Buffered (EXCEDRIN BACK & BODY) 250-250 MG tablet Take 1 tablet by mouth every 6 (six) hours as needed for pain. Patient taking differently: Take 2 tablets by mouth every 6 (six) hours as needed for pain.  05/14/16   Eustaquio Boyden, MD  amLODipine (NORVASC) 10 MG tablet TAKE 1 TABLET (10 MG TOTAL) BY MOUTH DAILY 05/14/16   Eustaquio Boyden, MD  Apoaequorin [facility] EXTRA STRENGTH PO) Take 1 capsule by mouth daily.    Historical Provider, MD  atorvastatin (LIPITOR)  40 MG tablet Take 1 tablet (40 mg total) by mouth daily. Patient taking differently: Take 20 mg by mouth daily.  05/14/16   Eustaquio Boyden, MD  Coenzyme Q10 (CO Q 10) 100 MG CAPS Take 100 mg by mouth daily.    Historical Provider, MD  enalapril-hydrochlorothiazide (VASERETIC) 10-25 MG tablet Take 1 tablet by mouth 2 (two) times daily. Patient taking differently: Take 1 tablet by mouth every evening.  05/14/16   Eustaquio Boyden, MD  fexofenadine (ALLEGRA) 180 MG tablet Take 180 mg by mouth daily as needed for allergies.     Historical Provider, MD  gabapentin (NEURONTIN) 300 MG capsule TAKE 1 CAPSULE FOUR TIMES A DAY Patient taking differently: TAKE 1 CAPSULE BID TIMES A DAY 10/15/16   Eustaquio Boyden, MD  glipiZIDE (GLUCOTROL) 5 MG tablet Take 1 tablet (5 mg total) by mouth daily before breakfast. Patient not taking: Reported on 11/08/2016 05/14/16   Eustaquio Boyden, MD  glipiZIDE (GLUCOTROL) 5 MG tablet Take 1 tablet (5 mg total) by mouth 2 (two) times daily before a meal. 10/15/16   Wynona Canes  Sharen HonesGutierrez, MD  HYDROcodone-acetaminophen (NORCO/VICODIN) 5-325 MG tablet Take 1 tablet by mouth every 6 (six) hours as needed. for pain 10/05/16   Historical Provider, MD  ibuprofen (ADVIL,MOTRIN) 200 MG tablet Take 200 mg by mouth every 6 (six) hours as needed for headache or moderate pain.    Historical Provider, MD  L-Lysine 1000 MG TABS Take 1,000 mg by mouth daily.    Historical Provider, MD  lidocaine (ASPERCREME W/LIDOCAINE) 4 % cream Apply 1 application topically as needed (for pain).    Historical Provider, MD  metFORMIN (GLUCOPHAGE-XR) 750 MG 24 hr tablet Take one tablet daily with breakfast. Patient taking differently: Take 750 mg by mouth every evening.  05/14/16   Eustaquio BoydenJavier Gutierrez, MD  metoprolol (LOPRESSOR) 50 MG tablet Take 1.5 tablets (75 mg total) by mouth 2 (two) times daily. Patient taking differently: Take 50 mg by mouth 2 (two) times daily.  05/14/16   Eustaquio BoydenJavier Gutierrez, MD  Multiple  Vitamin (ONE-A-DAY MENS PO) Take 1 tablet by mouth daily.    Historical Provider, MD  OMEGA-3 KRILL OIL PO Take 350 mg by mouth daily.    Historical Provider, MD  potassium gluconate 595 (99 K) MG TABS tablet Take 595 mg by mouth daily.    Historical Provider, MD    Family History Family History  Problem Relation Age of Onset  . Heart attack Father     CABG 6v  . Arthritis Father   . Hypertension Father   . Alzheimer's disease Mother   . Arthritis Mother   . Hypertension Mother   . Bipolar disorder Sister   . Bipolar disorder Sister   . Cancer Neg Hx   . Diabetes Neg Hx   . Stroke Neg Hx     Social History Social History  Substance Use Topics  . Smoking status: Current Every Day Smoker    Packs/day: 0.25    Years: 33.00    Types: Cigarettes  . Smokeless tobacco: Never Used  . Alcohol use No     Allergies   Varenicline tartrate   Review of Systems Review of Systems  Constitutional: Negative for fever.  HENT: Negative for sore throat.   Eyes: Negative for visual disturbance.  Respiratory: Negative for shortness of breath.   Cardiovascular: Negative for chest pain.  Gastrointestinal: Negative for abdominal pain, diarrhea and vomiting.  Genitourinary: Negative for difficulty urinating and dysuria.  Musculoskeletal: Negative for back pain and neck stiffness.  Skin: Negative for rash.  Neurological: Negative for dizziness, syncope, facial asymmetry, weakness, numbness and headaches.  Psychiatric/Behavioral: Positive for confusion (per bystanders).     Physical Exam Updated Vital Signs BP 102/81 (BP Location: Right Arm)   Pulse 60   Temp 98.2 F (36.8 C) (Oral)   Resp 18   SpO2 100%   Physical Exam  Constitutional: He appears well-developed and well-nourished. No distress.  HENT:  Head: Normocephalic.  Eyes: Conjunctivae and EOM are normal.  Neck: Normal range of motion.  Cardiovascular: Normal rate, regular rhythm, normal heart sounds and intact distal  pulses.  Exam reveals no gallop and no friction rub.   No murmur heard. Pulmonary/Chest: Effort normal and breath sounds normal. No respiratory distress. He has no wheezes. He has no rales.  Abdominal: Soft. He exhibits no distension. There is no tenderness. There is no guarding.  Musculoskeletal: He exhibits no edema.  Neurological: He is alert. He has normal strength. No cranial nerve deficit or sensory deficit. Coordination normal. GCS eye subscore is 4. GCS verbal  subscore is 5. GCS motor subscore is 6.  Oriented to self and location, reports it is January 21st  Skin: Skin is warm and dry. He is not diaphoretic.  Nursing note and vitals reviewed.    ED Treatments / Results  Labs (all labs ordered are listed, but only abnormal results are displayed) Labs Reviewed  CBC WITH DIFFERENTIAL/PLATELET - Abnormal; Notable for the following:       Result Value   WBC 11.9 (*)    Neutro Abs 8.8 (*)    All other components within normal limits  COMPREHENSIVE METABOLIC PANEL - Abnormal; Notable for the following:    BUN 37 (*)    Total Protein 6.4 (*)    All other components within normal limits  RAPID URINE DRUG SCREEN, HOSP PERFORMED - Abnormal; Notable for the following:    Benzodiazepines POSITIVE (*)    All other components within normal limits  URINALYSIS, ROUTINE W REFLEX MICROSCOPIC (NOT AT [facility]) - Abnormal; Notable for the following:    Hgb urine dipstick TRACE (*)    Leukocytes, UA SMALL (*)    All other components within normal limits  URINE MICROSCOPIC-ADD ON - Abnormal; Notable for the following:    Squamous Epithelial / LPF 0-5 (*)    Bacteria, UA RARE (*)    Casts HYALINE CASTS (*)    All other components within normal limits  URINE CULTURE  ETHANOL    EKG  EKG Interpretation  Date/Time:  Thursday November 22 2016 16:44:50 EST Ventricular Rate:  60 PR Interval:    QRS Duration: 107 QT Interval:  426 QTC Calculation: 426 R Axis:   63 Text Interpretation:  Sinus  rhythm TW more peaked in V2, artifact, no other significant changes No significant change since last tracing Confirmed by [facility] MD, Zayah Keilman ([id number]) on 11/22/2016 4:53:01 PM       Radiology Dg Chest 2 View  Result Date: 11/21/2016 CLINICAL DATA:  Acute onset of confusion.  Initial encounter. EXAM: CHEST  2 VIEW COMPARISON:  Chest radiograph performed 04/01/2015 FINDINGS: The lungs are well-aerated and clear. There is no evidence of focal opacification, pleural effusion or pneumothorax. The heart is normal in size; the mediastinal contour is within normal limits. No acute osseous abnormalities are seen. There is chronic superior subluxation of both humeral heads. IMPRESSION: No acute cardiopulmonary process seen. Electronically Signed   By: Roanna RaiderJeffery  Chang M.D.   On: 11/21/2016 21:20   Ct Head Code Stroke W/o Cm  Result Date: 11/21/2016 CLINICAL DATA:  Code stroke.  Acute onset altered mental status. EXAM: CT HEAD WITHOUT CONTRAST TECHNIQUE: Contiguous axial images were obtained from the base of the skull through the vertex without intravenous contrast. COMPARISON:  CT head without contrast 11/08/2016. MRI brain 07/21/2008 FINDINGS: Brain: No acute infarct, hemorrhage, or mass lesion is present. The basal ganglia and insular ribbon are intact bilaterally. Mild atrophy and white matter changes are stable. The ventricles are proportionate to the degree of atrophy. No significant extra-axial fluid collection is present. Vascular: Dense atherosclerotic calcifications are present within the cavernous internal carotid arteries, right greater than left. No hyperdense vessel is evident. Skull: The calvarium is intact. Sinuses/Orbits: The paranasal sinuses and mastoid air cells are clear. The globes and orbits are intact. Other: Resolving soft tissue swelling is noted in the right parietal scalp. ASPECTS [facility] Stroke Program Early CT Score) - Ganglionic level infarction (caudate, lentiform nuclei, internal  capsule, insula, M1-M3 cortex): 7/7 - Supraganglionic infarction (M4-M6 cortex): 3/3 Total score (  0-10 with 10 being normal): 10/10 IMPRESSION: 1. No acute intracranial abnormality or significant interval change. 2. Stable mild atrophy and white matter disease. 3. ASPECTS is 10/10 These results were called by telephone at the time of interpretation on 11/21/2016 at 8:26 pm to Dr. Lynden Oxford , who verbally acknowledged these results. Electronically Signed   By: Marin Roberts M.D.   On: 11/21/2016 20:28    Procedures Procedures (including critical care time)  Medications Ordered in ED Medications - No data to display   Initial Impression / Assessment and Plan / ED Course  I have reviewed the triage vital signs and the nursing notes.  Pertinent labs & imaging results that were available during my care of the patient were reviewed by me and considered in my medical decision making (see chart for details).  Clinical Course    70 year old male with history of coronary artery disease, carotid stenosis, chronic low back pain with previous encounters for pain management, hypertension, hyperlipidemia, diabetes, stroke who presents with concern for altered mental status. History is limited by absence of family members and inability to get in touch with them, however per neighbor and EMS, patient has been having episodes of running through the yard without clothes on throughout this week.  He is afebrile, well-appearing, without infectious source, doubt sepsis as etiology altered mental status. Patient denies alcohol use, chest pain or other concerns.   He came in to ED yesterday as a code stroke for acute confusion, and at that time Dr. Roseanne Reno of neurology had recommended MRI and EEG. The wife had spoken with physician yesterday who felt that he could be developing dementia, and overall suspect this is the most likely etiology of his behavioral disturbance. Patient has a normal neurologic  exam at this time, however given family patient returning to the emergency department for similar symptoms, and had not completed workup with MRI yesterday, will obtain MRI to evaluate for signs of acute stroke.  It appears he is at baseline now per wife report of some dementia. Given his appearance at this time and the information I have, I feel he does not require acute psychiatric consult. Recommend outpt Neuro follow up for EEG and dementia evaluation if MR negative.     UDS positive for benzos. ETOH negative. Urinalysis questionable for UTI. Signed out to Dr. Adriana Simas with MRI pending.     Final Clinical Impressions(s) / ED Diagnoses   Final diagnoses:  Transient confusion  Acute cystitis without hematuria  Behavior problem, adult    New Prescriptions New Prescriptions   No medications on file     Alvira Monday, MD 11/22/16 1735

## 2016-11-22 NOTE — ED Notes (Signed)
Pt now at rest pt's door open in clear sight of nurses station

## 2016-11-22 NOTE — Telephone Encounter (Signed)
Patient Name: [name] DOB: 04-Jan-1946 Initial Comment PT was in the hospital yesterday and not doing any better. Wondering neighborhood without clothes, very confused. Nurse Assessment Nurse: Charna Elizabethrumbull, RN, Cathy Date/Time (Eastern Time): 11/22/2016 12:01:36 PM Confirm and document reason for call. If symptomatic, describe symptoms. ---Caller states Bobby FastRonald is very confused and has wandering the neighborhood without clothes today. No severe breathing or swallowing difficulty. No known injury. No fever. Does the patient have any new or worsening symptoms? ---Yes Will a triage be completed? ---Yes Related visit to physician within the last 2 weeks? ---Yes Does the PT have any chronic conditions? (i.e. diabetes, asthma, etc.) ---Yes List chronic conditions. ---Diabetes (blood sugars stable), High Blood Pressure, back pain, Psycho-Sexual Dysfunction, TIA, CAD, CVA Is this a behavioral health or substance abuse call? ---No Guidelines Guideline Title Affirmed Question Affirmed Notes Confusion - Delirium [1] Difficult to awaken or acting confused (disoriented, slurred speech) AND [2] present now AND [3] diabetic Final Disposition User Call EMS 911 Now Charna Elizabethrumbull, RN, Lynden Angathy Disagree/Comply: Comply Left voice message re: 911 reached?

## 2016-11-22 NOTE — ED Triage Notes (Signed)
4 days had some N?N?D ,3 dfays ago had a fall ahs hematoma to rt side of head by rt ear past 2 days he has been found nude in yard by neighbor, pt has no recolecction, was seen and released by our er yesterday with dx of DTs, pt states he does not drink. Today pt was found agin in yard nude with no recollection again,pt no aaox4

## 2016-11-22 NOTE — ED Notes (Signed)
Patient transported to MRI 

## 2016-11-22 NOTE — ED Notes (Signed)
Spoke to SYSCOneighbor Kathy Espita [phone] c , per ms espita pt had no pants or shoes and had been out in neighborhood, twice this week, wife lives at home but neighbor does not think  She drives , they do have the wifes motrher living with them and a son of the wife that leaves early. Neighbor gave me # for son Acey Lavdavid Brener that does not live at the parents home [phone]

## 2016-11-23 NOTE — Clinical Social Work Note (Addendum)
CS[facility] that it's recommended that patient be IVC'[facility]. CSW has made ED physician aware. RN was given CSW's contact information if MD decides that IVC is appropriate after evaluation of the patient.   Roddie McBryant Kathleen Likins MSW, June ParkLCSW, [place], [phone]

## 2016-11-23 NOTE — ED Notes (Signed)
Pt sleeping in bed

## 2016-11-23 NOTE — ED Provider Notes (Signed)
  Physical Exam  BP 140/76 (BP Location: Right Arm)   Pulse 82   Temp 98.1 F (36.7 C) (Oral)   Resp 20   SpO2 96%   Physical Exam  ED Course  Procedures  MDM *Patient medical value. Still having confusion. Not a clear urinary tract infection and cultures been sent. Accepted for Geri psych placement but they request involuntary commitment. He appears to have criteria for commitment. Paperwork done.       Bobby CoreNathan Vannah Nadal, MD 11/23/16 [phone]

## 2016-11-23 NOTE — ED Notes (Signed)
Dinner has been ordered for patient. 

## 2016-11-23 NOTE — Progress Notes (Signed)
Pt's Son Acey LavDavid Hunley [phone] called asking for update. Son requesting to speak with social work when available.

## 2016-11-23 NOTE — ED Notes (Signed)
Faxed IVC paperwork @ 15:08

## 2016-11-23 NOTE — Progress Notes (Signed)
CSW has seen consult. Patients son does not currently have a consent to release form. CSW is unable to release information to patients family at this time.   Stacy GardnerErin Jeanna Giuffre, LCSWA Clinical Social Worker [phone]

## 2016-11-23 NOTE — ED Notes (Addendum)
Patient ate 25% of his Lunch and drink 1/2 of his juice.

## 2016-11-23 NOTE — ED Notes (Signed)
Dr Pickering at bedside 

## 2016-11-23 NOTE — ED Notes (Addendum)
RN to bedside to receive verbal consent for information to be released to Wife Sophronia Simas(Randy Sue) and Son Onalee Hua(David). Charge RN, Luciano CutterKaren Newnam at bedside to witness. Pt agreeable to informing Wife and Son of care.

## 2016-11-23 NOTE — Progress Notes (Signed)
[facility] admitting MD would like to accept pt for admission. Asks pt status- CSW informed her per chart pt voluntary at this time. Delorise ShinerGrace states MD requests pt be IVC'd due to "confusion, psychotic symptoms noted in assessment, pt being unable to engage with TTS assessor to indicate whether he understands the extent of the situation, would not recommend asking him to voluntarily consent to a behavioral health admission in this condition."  Psych team advises IVC appropriate based on information available. Informed ED CSW- awaiting evaluation by EDP to determine if pt appropriate for IVC and [facility]. Informed [facility]- she states a bed will be held until she hears back.  Ilean SkillMeghan Keylon Labelle, MSW, LCSW Clinical Social Work, Disposition  11/23/2016 [phone]

## 2016-11-23 NOTE — Progress Notes (Signed)
Called [facility] to give report. Facility asking for RN arrange transport prior to receiving report. RN called [facility] for transport, left voice-mail. Awaiting response.

## 2016-11-23 NOTE — BHH Counselor (Signed)
The pt has been referred to the following inpt hospitals for possible geropsych placement:  [facility]; [facility]; [facility]; [facility]; [facility]; [facility]  Princess BruinsAquicha Duff, MSW, LCSWA

## 2016-11-23 NOTE — Telephone Encounter (Signed)
It looks like he is still in the ER and is pending transfer [facility]

## 2016-11-23 NOTE — ED Notes (Signed)
Patient ate 50% of breakfast and drink all his orange juice and 1/2 of his Milk.

## 2016-11-23 NOTE — ED Notes (Signed)
Patient got out of bed and urinated on floor beside bed, and then had a bm in brief and wasn't steady on feet enough for me to change it, so I asked Nurse Amy if she could assist me in getting him cleaned up, she came right away to help.

## 2016-11-23 NOTE — ED Notes (Signed)
A snack and drink was placed at bedside.

## 2016-11-23 NOTE — BH Assessment (Signed)
[facility] bed [facility] pending

## 2016-11-23 NOTE — ED Notes (Signed)
Dr. Rubin PayorPickering giving IVC paperwork to [facility] secretary to fax

## 2016-11-23 NOTE — BH Assessment (Signed)
[facility] been [facility]. Accepting and attending physician is Dr. Eliott Nineunham. Pt is assigned to [address] and may arrive at anytime. Report may be called to [phone]. Per [facility] staff Clementeen Hoof(Lashanda) pt is not to be transported prior to report being called. Vicente SereneGabriel, RN informed of pt disposition.

## 2016-11-24 DIAGNOSIS — F17200 Nicotine dependence, unspecified, uncomplicated: Secondary | ICD-10-CM | POA: Diagnosis not present

## 2016-11-24 DIAGNOSIS — D649 Anemia, unspecified: Secondary | ICD-10-CM | POA: Diagnosis not present

## 2016-11-24 DIAGNOSIS — E876 Hypokalemia: Secondary | ICD-10-CM | POA: Diagnosis not present

## 2016-11-24 DIAGNOSIS — S50312A Abrasion of left elbow, initial encounter: Secondary | ICD-10-CM | POA: Diagnosis not present

## 2016-11-24 DIAGNOSIS — Y92230 Patient room in hospital as the place of occurrence of the external cause: Secondary | ICD-10-CM | POA: Diagnosis not present

## 2016-11-24 DIAGNOSIS — N182 Chronic kidney disease, stage 2 (mild): Secondary | ICD-10-CM | POA: Diagnosis not present

## 2016-11-24 DIAGNOSIS — E1122 Type 2 diabetes mellitus with diabetic chronic kidney disease: Secondary | ICD-10-CM | POA: Diagnosis not present

## 2016-11-24 DIAGNOSIS — G4700 Insomnia, unspecified: Secondary | ICD-10-CM | POA: Diagnosis not present

## 2016-11-24 DIAGNOSIS — M19032 Primary osteoarthritis, left wrist: Secondary | ICD-10-CM | POA: Diagnosis not present

## 2016-11-24 DIAGNOSIS — B952 Enterococcus as the cause of diseases classified elsewhere: Secondary | ICD-10-CM | POA: Diagnosis not present

## 2016-11-24 DIAGNOSIS — L89321 Pressure ulcer of left buttock, stage 1: Secondary | ICD-10-CM | POA: Diagnosis not present

## 2016-11-24 DIAGNOSIS — R441 Visual hallucinations: Secondary | ICD-10-CM | POA: Diagnosis not present

## 2016-11-24 DIAGNOSIS — W07XXXA Fall from chair, initial encounter: Secondary | ICD-10-CM | POA: Diagnosis not present

## 2016-11-24 DIAGNOSIS — K219 Gastro-esophageal reflux disease without esophagitis: Secondary | ICD-10-CM | POA: Diagnosis not present

## 2016-11-24 DIAGNOSIS — E785 Hyperlipidemia, unspecified: Secondary | ICD-10-CM | POA: Diagnosis not present

## 2016-11-24 DIAGNOSIS — R451 Restlessness and agitation: Secondary | ICD-10-CM | POA: Diagnosis not present

## 2016-11-24 DIAGNOSIS — E860 Dehydration: Secondary | ICD-10-CM | POA: Diagnosis not present

## 2016-11-24 DIAGNOSIS — N390 Urinary tract infection, site not specified: Secondary | ICD-10-CM | POA: Diagnosis not present

## 2016-11-24 DIAGNOSIS — R440 Auditory hallucinations: Secondary | ICD-10-CM | POA: Diagnosis not present

## 2016-11-24 DIAGNOSIS — R2689 Other abnormalities of gait and mobility: Secondary | ICD-10-CM | POA: Diagnosis not present

## 2016-11-24 DIAGNOSIS — F0151 Vascular dementia with behavioral disturbance: Secondary | ICD-10-CM | POA: Diagnosis not present

## 2016-11-24 DIAGNOSIS — Z681 Body mass index (BMI) 19 or less, adult: Secondary | ICD-10-CM | POA: Diagnosis not present

## 2016-11-24 DIAGNOSIS — J449 Chronic obstructive pulmonary disease, unspecified: Secondary | ICD-10-CM | POA: Diagnosis not present

## 2016-11-24 DIAGNOSIS — I129 Hypertensive chronic kidney disease with stage 1 through stage 4 chronic kidney disease, or unspecified chronic kidney disease: Secondary | ICD-10-CM | POA: Diagnosis not present

## 2016-11-24 LAB — URINE CULTURE

## 2016-11-24 NOTE — ED Notes (Signed)
Patient did have 3 bags of clothes 1 shirt, 1 jacket, 1 pair of jeans.

## 2016-11-24 NOTE — ED Notes (Signed)
Pt's breakfast has been ordered.  

## 2016-11-24 NOTE — ED Notes (Signed)
Charge nurse notified to review EMTALA.

## 2016-11-24 NOTE — ED Provider Notes (Signed)
[facility]. Notes and results reviewed. Patient has had diagnostic evaluation for confusion and behavior dysfunction. He has had neurology consultation. At this time, symptoms are felt to be due to deteriorating dementia.  Patient's vital signs remained stable. Diagnostic evaluation does not show signs of infection or acute stroke.  I have reexamined the patient this morning. He is resting in bed. He awakens appropriately to voice and light touch. Patient is at this time cooperative and does not show evidence of hostile behavior. He is alert with no respiratory distress and good color. Heart is regular. Lungs are clear. Abdomen is soft. Extremities do not have any peripheral edema, significant injury or areas suggestive of cellulitis.  Patient is stable for transfer. EMTALA completed.   Arby BarretteMarcy Shonette Rhames, MD 11/24/16 [phone]

## 2016-11-24 NOTE — ED Notes (Signed)
Doctor at bedside.

## 2016-11-24 NOTE — ED Notes (Signed)
Sheriff arrived to transport patient.

## 2016-11-24 NOTE — ED Notes (Signed)
Dr. Manus Gunningancour aware of emtala needing to be completed. Will complete when sherriff arrives for transport.

## 2016-11-24 NOTE — ED Notes (Signed)
Sister of patient called and speaking with patient on phone.

## 2016-11-25 ENCOUNTER — Encounter: Payer: Self-pay | Admitting: Internal Medicine

## 2016-12-01 DIAGNOSIS — R4189 Other symptoms and signs involving cognitive functions and awareness: Secondary | ICD-10-CM | POA: Diagnosis not present

## 2016-12-20 DIAGNOSIS — R4189 Other symptoms and signs involving cognitive functions and awareness: Secondary | ICD-10-CM | POA: Diagnosis not present

## 2016-12-26 DIAGNOSIS — L89151 Pressure ulcer of sacral region, stage 1: Secondary | ICD-10-CM | POA: Diagnosis not present

## 2016-12-26 DIAGNOSIS — D509 Iron deficiency anemia, unspecified: Secondary | ICD-10-CM | POA: Diagnosis not present

## 2016-12-26 DIAGNOSIS — S161XXA Strain of muscle, fascia and tendon at neck level, initial encounter: Secondary | ICD-10-CM | POA: Diagnosis not present

## 2016-12-26 DIAGNOSIS — F419 Anxiety disorder, unspecified: Secondary | ICD-10-CM | POA: Diagnosis not present

## 2016-12-26 DIAGNOSIS — F919 Conduct disorder, unspecified: Secondary | ICD-10-CM | POA: Diagnosis not present

## 2016-12-26 DIAGNOSIS — R262 Difficulty in walking, not elsewhere classified: Secondary | ICD-10-CM | POA: Diagnosis not present

## 2016-12-26 DIAGNOSIS — F17210 Nicotine dependence, cigarettes, uncomplicated: Secondary | ICD-10-CM | POA: Diagnosis not present

## 2016-12-26 DIAGNOSIS — S199 Unspecified injury of neck: Secondary | ICD-10-CM | POA: Diagnosis not present

## 2016-12-26 DIAGNOSIS — F28 Other psychotic disorder not due to a substance or known physiological condition: Secondary | ICD-10-CM | POA: Diagnosis not present

## 2016-12-26 DIAGNOSIS — E1165 Type 2 diabetes mellitus with hyperglycemia: Secondary | ICD-10-CM | POA: Diagnosis not present

## 2016-12-26 DIAGNOSIS — S199XXA Unspecified injury of neck, initial encounter: Secondary | ICD-10-CM | POA: Diagnosis not present

## 2016-12-26 DIAGNOSIS — W19XXXA Unspecified fall, initial encounter: Secondary | ICD-10-CM | POA: Diagnosis not present

## 2016-12-26 DIAGNOSIS — L89300 Pressure ulcer of unspecified buttock, unstageable: Secondary | ICD-10-CM | POA: Diagnosis not present

## 2016-12-26 DIAGNOSIS — W2209XA Striking against other stationary object, initial encounter: Secondary | ICD-10-CM | POA: Diagnosis not present

## 2016-12-26 DIAGNOSIS — R296 Repeated falls: Secondary | ICD-10-CM | POA: Diagnosis not present

## 2016-12-26 DIAGNOSIS — E43 Unspecified severe protein-calorie malnutrition: Secondary | ICD-10-CM | POA: Diagnosis not present

## 2016-12-26 DIAGNOSIS — J449 Chronic obstructive pulmonary disease, unspecified: Secondary | ICD-10-CM | POA: Diagnosis not present

## 2016-12-26 DIAGNOSIS — R259 Unspecified abnormal involuntary movements: Secondary | ICD-10-CM | POA: Diagnosis not present

## 2016-12-26 DIAGNOSIS — E1122 Type 2 diabetes mellitus with diabetic chronic kidney disease: Secondary | ICD-10-CM | POA: Diagnosis not present

## 2016-12-26 DIAGNOSIS — R9431 Abnormal electrocardiogram [ECG] [EKG]: Secondary | ICD-10-CM | POA: Diagnosis not present

## 2016-12-26 DIAGNOSIS — I10 Essential (primary) hypertension: Secondary | ICD-10-CM | POA: Diagnosis not present

## 2016-12-26 DIAGNOSIS — M150 Primary generalized (osteo)arthritis: Secondary | ICD-10-CM | POA: Diagnosis not present

## 2016-12-26 DIAGNOSIS — M25561 Pain in right knee: Secondary | ICD-10-CM | POA: Diagnosis not present

## 2016-12-26 DIAGNOSIS — M549 Dorsalgia, unspecified: Secondary | ICD-10-CM | POA: Diagnosis not present

## 2016-12-26 DIAGNOSIS — R531 Weakness: Secondary | ICD-10-CM | POA: Diagnosis not present

## 2016-12-26 DIAGNOSIS — B952 Enterococcus as the cause of diseases classified elsewhere: Secondary | ICD-10-CM | POA: Diagnosis not present

## 2016-12-26 DIAGNOSIS — M542 Cervicalgia: Secondary | ICD-10-CM | POA: Diagnosis not present

## 2016-12-26 DIAGNOSIS — I129 Hypertensive chronic kidney disease with stage 1 through stage 4 chronic kidney disease, or unspecified chronic kidney disease: Secondary | ICD-10-CM | POA: Diagnosis not present

## 2016-12-26 DIAGNOSIS — E876 Hypokalemia: Secondary | ICD-10-CM | POA: Diagnosis not present

## 2016-12-26 DIAGNOSIS — T148XXA Other injury of unspecified body region, initial encounter: Secondary | ICD-10-CM | POA: Diagnosis not present

## 2016-12-26 DIAGNOSIS — S0003XA Contusion of scalp, initial encounter: Secondary | ICD-10-CM | POA: Diagnosis not present

## 2016-12-26 DIAGNOSIS — F22 Delusional disorders: Secondary | ICD-10-CM | POA: Diagnosis not present

## 2016-12-26 DIAGNOSIS — F0151 Vascular dementia with behavioral disturbance: Secondary | ICD-10-CM | POA: Diagnosis not present

## 2016-12-26 DIAGNOSIS — M6281 Muscle weakness (generalized): Secondary | ICD-10-CM | POA: Diagnosis not present

## 2016-12-26 DIAGNOSIS — K219 Gastro-esophageal reflux disease without esophagitis: Secondary | ICD-10-CM | POA: Diagnosis not present

## 2016-12-26 DIAGNOSIS — N3000 Acute cystitis without hematuria: Secondary | ICD-10-CM | POA: Diagnosis not present

## 2016-12-26 DIAGNOSIS — G4489 Other headache syndrome: Secondary | ICD-10-CM | POA: Diagnosis not present

## 2016-12-26 DIAGNOSIS — S299XXA Unspecified injury of thorax, initial encounter: Secondary | ICD-10-CM | POA: Diagnosis not present

## 2016-12-26 DIAGNOSIS — N390 Urinary tract infection, site not specified: Secondary | ICD-10-CM | POA: Diagnosis not present

## 2016-12-26 DIAGNOSIS — E785 Hyperlipidemia, unspecified: Secondary | ICD-10-CM | POA: Diagnosis not present

## 2016-12-27 DIAGNOSIS — F22 Delusional disorders: Secondary | ICD-10-CM | POA: Diagnosis not present

## 2016-12-27 DIAGNOSIS — E1165 Type 2 diabetes mellitus with hyperglycemia: Secondary | ICD-10-CM | POA: Diagnosis not present

## 2016-12-27 DIAGNOSIS — S0003XA Contusion of scalp, initial encounter: Secondary | ICD-10-CM | POA: Diagnosis not present

## 2016-12-27 DIAGNOSIS — M150 Primary generalized (osteo)arthritis: Secondary | ICD-10-CM | POA: Diagnosis not present

## 2016-12-27 DIAGNOSIS — W2209XA Striking against other stationary object, initial encounter: Secondary | ICD-10-CM | POA: Diagnosis not present

## 2016-12-27 DIAGNOSIS — F0151 Vascular dementia with behavioral disturbance: Secondary | ICD-10-CM | POA: Diagnosis not present

## 2016-12-27 DIAGNOSIS — S199XXA Unspecified injury of neck, initial encounter: Secondary | ICD-10-CM | POA: Diagnosis not present

## 2016-12-27 DIAGNOSIS — N3000 Acute cystitis without hematuria: Secondary | ICD-10-CM | POA: Diagnosis not present

## 2016-12-27 DIAGNOSIS — S299XXA Unspecified injury of thorax, initial encounter: Secondary | ICD-10-CM | POA: Diagnosis not present

## 2017-01-03 DIAGNOSIS — I10 Essential (primary) hypertension: Secondary | ICD-10-CM | POA: Diagnosis not present

## 2017-01-03 DIAGNOSIS — F22 Delusional disorders: Secondary | ICD-10-CM | POA: Diagnosis not present

## 2017-01-03 DIAGNOSIS — N3000 Acute cystitis without hematuria: Secondary | ICD-10-CM | POA: Diagnosis not present

## 2017-01-03 DIAGNOSIS — F0151 Vascular dementia with behavioral disturbance: Secondary | ICD-10-CM | POA: Diagnosis not present

## 2017-01-09 DIAGNOSIS — W19XXXA Unspecified fall, initial encounter: Secondary | ICD-10-CM | POA: Diagnosis not present

## 2017-01-09 DIAGNOSIS — M542 Cervicalgia: Secondary | ICD-10-CM | POA: Diagnosis not present

## 2017-01-09 DIAGNOSIS — S161XXA Strain of muscle, fascia and tendon at neck level, initial encounter: Secondary | ICD-10-CM | POA: Diagnosis not present

## 2017-01-09 DIAGNOSIS — S199XXA Unspecified injury of neck, initial encounter: Secondary | ICD-10-CM | POA: Diagnosis not present

## 2017-01-09 DIAGNOSIS — S0003XA Contusion of scalp, initial encounter: Secondary | ICD-10-CM | POA: Diagnosis not present

## 2017-01-10 DIAGNOSIS — F0151 Vascular dementia with behavioral disturbance: Secondary | ICD-10-CM | POA: Diagnosis not present

## 2017-01-10 DIAGNOSIS — F22 Delusional disorders: Secondary | ICD-10-CM | POA: Diagnosis not present

## 2017-01-10 DIAGNOSIS — F349 Persistent mood [affective] disorder, unspecified: Secondary | ICD-10-CM | POA: Diagnosis not present

## 2017-01-10 DIAGNOSIS — N3000 Acute cystitis without hematuria: Secondary | ICD-10-CM | POA: Diagnosis not present

## 2017-01-15 DIAGNOSIS — M25532 Pain in left wrist: Secondary | ICD-10-CM | POA: Diagnosis not present

## 2017-01-15 DIAGNOSIS — M25432 Effusion, left wrist: Secondary | ICD-10-CM | POA: Diagnosis not present

## 2017-01-15 DIAGNOSIS — R296 Repeated falls: Secondary | ICD-10-CM | POA: Diagnosis not present

## 2017-01-16 DIAGNOSIS — R296 Repeated falls: Secondary | ICD-10-CM | POA: Diagnosis not present

## 2017-01-16 DIAGNOSIS — S0083XD Contusion of other part of head, subsequent encounter: Secondary | ICD-10-CM | POA: Diagnosis not present

## 2017-01-16 DIAGNOSIS — Z79899 Other long term (current) drug therapy: Secondary | ICD-10-CM | POA: Diagnosis not present

## 2017-01-16 DIAGNOSIS — S0101XA Laceration without foreign body of scalp, initial encounter: Secondary | ICD-10-CM | POA: Diagnosis not present

## 2017-01-17 DIAGNOSIS — N3000 Acute cystitis without hematuria: Secondary | ICD-10-CM | POA: Diagnosis not present

## 2017-01-17 DIAGNOSIS — F349 Persistent mood [affective] disorder, unspecified: Secondary | ICD-10-CM | POA: Diagnosis not present

## 2017-01-17 DIAGNOSIS — F0151 Vascular dementia with behavioral disturbance: Secondary | ICD-10-CM | POA: Diagnosis not present

## 2017-01-17 DIAGNOSIS — F22 Delusional disorders: Secondary | ICD-10-CM | POA: Diagnosis not present

## 2017-02-04 ENCOUNTER — Telehealth: Payer: Self-pay

## 2017-02-04 NOTE — Telephone Encounter (Signed)
PLEASE NOTE: All timestamps contained within this report are represented as [place]. CONFIDENTIALTY NOTICE: This fax transmission is intended only for the addressee. It contains information that is legally privileged, confidential or otherwise protected from use or disclosure. If you are not the intended recipient, you are strictly prohibited from reviewing, disclosing, copying using or disseminating any of this information or taking any action in reliance on or regarding this information. If you have received this fax in error, please notify us immediately by telephone so that we can arrange for its return to us. Phone: [phone], Toll-Free: [phone], Fax: [phone] Page: 1 of 1 Call Id: [id number] [facility] Client Nonclinical Telephone Record [facility] Client [facility] - [facility] [place] Physician Eustaquio BoydenGutierrez, Javier - MD Contact Type Call Who Is Calling Physician / Provider / Hospital Call Type Provider [facility] Reason for Call Request to speak to Physician Initial Comment Caller states pt has passed away, needing to get in touch with dr. He says phone service not good, if you can't reach him, call back Additional Comment Patient Name Bobby PollenRonald Schier Sr Patient DOB 02/08/2046 Requesting Provider Lorel MonacoStewart Wilkins Physician Number [phone] [facility] P[facility] DoctorName Phone DateTime Result/Outcome Message Type Notes Raechel AcheDuncan, Shaw - MD [phone] 02/07/2017 3:17:25 PM Called On Call Provider - Reached Doctor Paged Raechel Acheuncan, Shaw - MD 01/28/2017 3:17:28 PM Spoke with On Call - General Message Result Call Closed By: De Hollingsheadaniel Goldston Transaction Date/Time: 01/30/2017 2:58:24 PM (ET)

## 2017-02-04 NOTE — Telephone Encounter (Signed)
Filled death certificate and in [name]'s box.

## 2017-02-04 NOTE — Telephone Encounter (Deleted)
Death certificate form filled out.

## 2017-02-05 NOTE — Telephone Encounter (Signed)
[facility] per Dr. Reece AgarG.

## 2017-02-21 DEATH — deceased

## 2019-03-25 DEATH — deceased
